# Patient Record
Sex: Male | Born: 1935 | Race: White | Hispanic: No | Marital: Married | Smoking: Former smoker
Health system: Southern US, Community
[De-identification: ages and names within clinical notes are randomized; demographics above are authoritative.]

## PROBLEM LIST (undated history)

## (undated) DIAGNOSIS — I714 Abdominal aortic aneurysm, without rupture, unspecified: Secondary | ICD-10-CM

## (undated) DIAGNOSIS — J189 Pneumonia, unspecified organism: Secondary | ICD-10-CM

## (undated) DIAGNOSIS — N183 Chronic kidney disease, stage 3 unspecified: Secondary | ICD-10-CM

## (undated) DIAGNOSIS — K5190 Ulcerative colitis, unspecified, without complications: Secondary | ICD-10-CM

## (undated) DIAGNOSIS — R030 Elevated blood-pressure reading, without diagnosis of hypertension: Secondary | ICD-10-CM

## (undated) DIAGNOSIS — N529 Male erectile dysfunction, unspecified: Secondary | ICD-10-CM

## (undated) DIAGNOSIS — K219 Gastro-esophageal reflux disease without esophagitis: Secondary | ICD-10-CM

## (undated) DIAGNOSIS — E785 Hyperlipidemia, unspecified: Secondary | ICD-10-CM

## (undated) DIAGNOSIS — Z87442 Personal history of urinary calculi: Secondary | ICD-10-CM

## (undated) HISTORY — DX: Gastro-esophageal reflux disease without esophagitis: K21.9

## (undated) HISTORY — DX: Abdominal aortic aneurysm, without rupture: I71.4

## (undated) HISTORY — DX: Hyperlipidemia, unspecified: E78.5

## (undated) HISTORY — PX: MANDIBLE FRACTURE SURGERY: SHX706

## (undated) HISTORY — DX: Ulcerative colitis, unspecified, without complications: K51.90

## (undated) HISTORY — DX: Chronic kidney disease, stage 3 (moderate): N18.3

## (undated) HISTORY — PX: BACK SURGERY: SHX140

## (undated) HISTORY — DX: Abdominal aortic aneurysm, without rupture, unspecified: I71.40

## (undated) HISTORY — DX: Male erectile dysfunction, unspecified: N52.9

## (undated) HISTORY — PX: TONSILLECTOMY: SUR1361

## (undated) HISTORY — DX: Pneumonia, unspecified organism: J18.9

## (undated) HISTORY — PX: FRACTURE SURGERY: SHX138

## (undated) HISTORY — DX: Chronic kidney disease, stage 3 unspecified: N18.30

## (undated) HISTORY — PX: KIDNEY STONE SURGERY: SHX686

## (undated) HISTORY — DX: Elevated blood-pressure reading, without diagnosis of hypertension: R03.0

---

## 1998-11-06 ENCOUNTER — Ambulatory Visit (HOSPITAL_COMMUNITY): Admission: RE | Admit: 1998-11-06 | Discharge: 1998-11-06 | Payer: Self-pay | Admitting: Gastroenterology

## 2000-01-16 ENCOUNTER — Encounter: Admission: RE | Admit: 2000-01-16 | Discharge: 2000-01-16 | Payer: Self-pay | Admitting: Family Medicine

## 2000-01-16 ENCOUNTER — Encounter: Payer: Self-pay | Admitting: Family Medicine

## 2000-01-17 ENCOUNTER — Encounter: Payer: Self-pay | Admitting: Family Medicine

## 2000-01-17 ENCOUNTER — Encounter: Admission: RE | Admit: 2000-01-17 | Discharge: 2000-01-17 | Payer: Self-pay | Admitting: Family Medicine

## 2000-02-17 ENCOUNTER — Emergency Department (HOSPITAL_COMMUNITY): Admission: EM | Admit: 2000-02-17 | Discharge: 2000-02-17 | Payer: Self-pay | Admitting: Emergency Medicine

## 2000-02-17 ENCOUNTER — Encounter: Payer: Self-pay | Admitting: Emergency Medicine

## 2000-03-11 HISTORY — PX: MAXIMUM ACCESS (MAS)POSTERIOR LUMBAR INTERBODY FUSION (PLIF) 1 LEVEL: SHX6368

## 2000-03-29 ENCOUNTER — Encounter: Payer: Self-pay | Admitting: Neurosurgery

## 2000-03-30 ENCOUNTER — Encounter: Payer: Self-pay | Admitting: Neurosurgery

## 2000-03-30 ENCOUNTER — Inpatient Hospital Stay (HOSPITAL_COMMUNITY): Admission: RE | Admit: 2000-03-30 | Discharge: 2000-03-31 | Payer: Self-pay | Admitting: Neurosurgery

## 2004-06-18 ENCOUNTER — Ambulatory Visit (HOSPITAL_COMMUNITY): Admission: RE | Admit: 2004-06-18 | Discharge: 2004-06-18 | Payer: Self-pay | Admitting: Gastroenterology

## 2005-09-23 ENCOUNTER — Encounter: Admission: RE | Admit: 2005-09-23 | Discharge: 2005-09-23 | Payer: Self-pay | Admitting: Family Medicine

## 2006-03-02 ENCOUNTER — Emergency Department (HOSPITAL_COMMUNITY): Admission: EM | Admit: 2006-03-02 | Discharge: 2006-03-02 | Payer: Self-pay | Admitting: Emergency Medicine

## 2007-09-19 ENCOUNTER — Encounter: Admission: RE | Admit: 2007-09-19 | Discharge: 2007-09-19 | Payer: Self-pay | Admitting: Family Medicine

## 2007-10-31 ENCOUNTER — Encounter: Admission: RE | Admit: 2007-10-31 | Discharge: 2007-10-31 | Payer: Self-pay | Admitting: Family Medicine

## 2010-09-26 NOTE — Op Note (Signed)
[place]  Patient:    Bryan Ford, Bryan Ford                       MRN: [id number] Proc. Date: 03/30/00 Adm. Date:  [id number] Attending:  Melton Krebs                           Operative Report  PREOPERATIVE DIAGNOSES:  Spondylosis and spinal stenosis L5-S1.  POSTOPERATIVE DIAGNOSES:  Spondylosis and spinal stenosis L5-S1.  OPERATION:  L5-S1 laminectomy, diskectomy, posterior lumbar body fusion with decompressive lumbar laminectomy at L5-S1.  SURGEON:  Elizabeth Sauer, M.D.  ANESTHESIA:  General endotracheal.  PREP:  Sterile Betadine prep and scrub with alcohol wipe.  COMPLICATIONS:  None.  INDICATIONS:  A 77 year old right-handed white gentleman with severe spinal stenosis at L5-S1.  DESCRIPTION OF PROCEDURE:  The patient was taken to the operating room and intubated. He was placed prone on the operating table. The patient was shaved and prepped and draped in the usual sterile fashion. The skin was infiltrated with 1% Lidocaine and 1:400,000 epinephrine. The skin was incised from the bottom of L4 to the bottom of S2 and the laminate of L5 and S1 were exposed bilaterally in the subperiosteal plane. Intraoperative x-ray showed the marker to be under the S1 lamina. Laminectomy was then carried out of L5 with under-cutting of S1 and under-cutting of L4. The entire L5 lamina was removed leaving the pars intra-articularis and the facet joints. At the L5-S1 interspace there was significant stenosis and overgrowth with abundant redundant ligamentum flavum causing significant both lateral recess and spinal stenosis. This was removed without difficulty and the laminectomy was completed out flush with the pedicles bilaterally. The 5N1 ridge were carefully explored as they traversed this area and were found to be free. The wound was irrigated and hemostasis assured. The fascia was reapproximated with 0 Vicryl in an interrupted fashion and subcutaneous  tissues were reapproximated with 0 Vicryl in an interrupted fashion. The subcuticular tissues were reapproximated with 3-0 Vicryl in an interrupted fashion. The skin was closed with 3-0 nylon in a running lock fashion. A Betadine towel dressing was applied and made occlusive with Op-Site. The patient returned to the recovery room in good condition. DD:  03/30/00 TD:  03/30/00 Job: [id number]

## 2010-09-26 NOTE — Op Note (Signed)
NAME:  Bryan Ford, Bryan Ford                ACCOUNT NO.:  [id number]   MEDICAL RECORD NO.:  [id number]          PATIENT TYPE:  AMB   LOCATION:  ENDO                         FACILITY:  [facility]   PHYSICIAN:  James L. Rolla Flatten., M.D.DATE OF BIRTH:  January 24, 1926   DATE OF PROCEDURE:  06/18/2004  DATE OF DISCHARGE:                                 OPERATIVE REPORT   PROCEDURE:  Colonoscopy.   MEDICATIONS:  Fentanyl 62.5 mcg, Versed 6 mg IV.   SCOPE:  Olympus pediatric adjustable colonoscope.   INDICATIONS FOR PROCEDURE:  The patient has had a previous history of  adenomatous colon polyps. This is done as a five year followup.   DESCRIPTION OF PROCEDURE:  The procedure explained to the patient and  consent obtained. With the patient in the left lateral decubitus position,  the Olympus scope was inserted and advanced.  The prep was excellent. We  were able to reach the cecum without difficulty. The ileocecal valve and  appendiceal orifice seen. The scope withdrawn and the cecum, ascending  colon, transverse colon, descending and sigmoid colon were seen well and no  further polyps seen. The rectum was free of polyps. There was no  diverticular disease. The scope was withdrawn. The patient tolerated the  procedure well.   ASSESSMENT:  Previous history of colon polyps with negative colonoscopy at  this time, V12.72.   PLAN:  Will recommend yearly hemoccult's and repeat colonoscopy in five  years.      JLE/MEDQ  D:  06/18/2004  T:  06/18/2004  Job:  [id number]   cc:   Luna Kitchens. Redmond Pulling, M.D.  [address]  [place]  [place] [address]  Fax: [phone]

## 2011-02-04 ENCOUNTER — Emergency Department (HOSPITAL_COMMUNITY): Payer: Medicare Other

## 2011-02-04 ENCOUNTER — Emergency Department (HOSPITAL_COMMUNITY)
Admission: EM | Admit: 2011-02-04 | Discharge: 2011-02-05 | Disposition: A | Discharge status: Home / Self Care | Payer: Medicare Other | Attending: Emergency Medicine | Admitting: Emergency Medicine

## 2011-02-04 DIAGNOSIS — K5289 Other specified noninfective gastroenteritis and colitis: Secondary | ICD-10-CM | POA: Insufficient documentation

## 2011-02-04 DIAGNOSIS — K921 Melena: Secondary | ICD-10-CM | POA: Insufficient documentation

## 2011-02-04 DIAGNOSIS — R509 Fever, unspecified: Secondary | ICD-10-CM | POA: Insufficient documentation

## 2011-02-04 DIAGNOSIS — I714 Abdominal aortic aneurysm, without rupture, unspecified: Secondary | ICD-10-CM | POA: Insufficient documentation

## 2011-02-04 DIAGNOSIS — R197 Diarrhea, unspecified: Secondary | ICD-10-CM | POA: Insufficient documentation

## 2011-02-04 DIAGNOSIS — R1031 Right lower quadrant pain: Secondary | ICD-10-CM | POA: Insufficient documentation

## 2011-02-04 LAB — DIFFERENTIAL
Basophils Absolute: 0 10*3/uL (ref 0.0–0.1)
Basophils Relative: 0 % (ref 0–1)
Eosinophils Relative: 0 % (ref 0–5)
Monocytes Absolute: 0.5 10*3/uL (ref 0.1–1.0)

## 2011-02-04 LAB — CBC
HCT: 30.7 % — ABNORMAL LOW (ref 39.0–52.0)
MCH: 29.4 pg (ref 26.0–34.0)
MCHC: 33.9 g/dL (ref 30.0–36.0)
RDW: 14.2 % (ref 11.5–15.5)

## 2011-02-04 LAB — BASIC METABOLIC PANEL
BUN: 20 mg/dL (ref 6–23)
Calcium: 9 mg/dL (ref 8.4–10.5)
GFR calc Af Amer: 47 mL/min — ABNORMAL LOW (ref >60)
GFR calc non Af Amer: 39 mL/min — ABNORMAL LOW (ref >60)
Potassium: 4 mEq/L (ref 3.5–5.1)
Sodium: 141 mEq/L (ref 135–145)

## 2011-02-04 LAB — URINALYSIS, ROUTINE W REFLEX MICROSCOPIC
Bilirubin Urine: NEGATIVE
Nitrite: NEGATIVE
Protein, ur: NEGATIVE mg/dL
Specific Gravity, Urine: 1.011 (ref 1.005–1.030)
Urobilinogen, UA: 0.2 mg/dL (ref 0.0–1.0)

## 2011-02-04 LAB — URINE MICROSCOPIC-ADD ON

## 2011-02-05 ENCOUNTER — Encounter (HOSPITAL_COMMUNITY): Payer: Self-pay | Admitting: Radiology

## 2011-06-08 DIAGNOSIS — D500 Iron deficiency anemia secondary to blood loss (chronic): Secondary | ICD-10-CM | POA: Diagnosis not present

## 2011-08-07 ENCOUNTER — Other Ambulatory Visit: Payer: Self-pay | Admitting: Internal Medicine

## 2011-08-07 DIAGNOSIS — I714 Abdominal aortic aneurysm, without rupture: Secondary | ICD-10-CM

## 2011-09-22 DIAGNOSIS — E785 Hyperlipidemia, unspecified: Secondary | ICD-10-CM | POA: Diagnosis not present

## 2011-09-22 DIAGNOSIS — N529 Male erectile dysfunction, unspecified: Secondary | ICD-10-CM | POA: Diagnosis not present

## 2011-09-22 DIAGNOSIS — Z125 Encounter for screening for malignant neoplasm of prostate: Secondary | ICD-10-CM | POA: Diagnosis not present

## 2011-09-29 ENCOUNTER — Other Ambulatory Visit: Payer: 59

## 2011-10-01 ENCOUNTER — Ambulatory Visit
Admission: RE | Admit: 2011-10-01 | Discharge: 2011-10-01 | Disposition: A | Discharge status: Home / Self Care | Payer: Medicare Other | Source: Ambulatory Visit | Attending: Internal Medicine | Admitting: Internal Medicine

## 2011-10-01 DIAGNOSIS — J309 Allergic rhinitis, unspecified: Secondary | ICD-10-CM | POA: Diagnosis not present

## 2011-10-01 DIAGNOSIS — E785 Hyperlipidemia, unspecified: Secondary | ICD-10-CM | POA: Diagnosis not present

## 2011-10-01 DIAGNOSIS — I714 Abdominal aortic aneurysm, without rupture: Secondary | ICD-10-CM

## 2011-10-01 DIAGNOSIS — Z23 Encounter for immunization: Secondary | ICD-10-CM | POA: Diagnosis not present

## 2011-11-30 DIAGNOSIS — J019 Acute sinusitis, unspecified: Secondary | ICD-10-CM | POA: Diagnosis not present

## 2011-11-30 DIAGNOSIS — J301 Allergic rhinitis due to pollen: Secondary | ICD-10-CM | POA: Diagnosis not present

## 2012-02-16 DIAGNOSIS — J331 Polypoid sinus degeneration: Secondary | ICD-10-CM | POA: Diagnosis not present

## 2012-02-16 DIAGNOSIS — Z23 Encounter for immunization: Secondary | ICD-10-CM | POA: Diagnosis not present

## 2012-02-16 DIAGNOSIS — H9210 Otorrhea, unspecified ear: Secondary | ICD-10-CM | POA: Diagnosis not present

## 2012-02-16 DIAGNOSIS — H6980 Other specified disorders of Eustachian tube, unspecified ear: Secondary | ICD-10-CM | POA: Diagnosis not present

## 2012-03-03 DIAGNOSIS — K5150 Left sided colitis without complications: Secondary | ICD-10-CM | POA: Diagnosis not present

## 2012-03-03 DIAGNOSIS — Z86010 Personal history of colonic polyps: Secondary | ICD-10-CM | POA: Diagnosis not present

## 2012-03-08 DIAGNOSIS — H9210 Otorrhea, unspecified ear: Secondary | ICD-10-CM | POA: Diagnosis not present

## 2012-03-08 DIAGNOSIS — H908 Mixed conductive and sensorineural hearing loss, unspecified: Secondary | ICD-10-CM | POA: Diagnosis not present

## 2012-03-08 DIAGNOSIS — J331 Polypoid sinus degeneration: Secondary | ICD-10-CM | POA: Diagnosis not present

## 2012-03-08 DIAGNOSIS — H6980 Other specified disorders of Eustachian tube, unspecified ear: Secondary | ICD-10-CM | POA: Diagnosis not present

## 2012-03-25 DIAGNOSIS — J331 Polypoid sinus degeneration: Secondary | ICD-10-CM | POA: Diagnosis not present

## 2012-03-25 DIAGNOSIS — H908 Mixed conductive and sensorineural hearing loss, unspecified: Secondary | ICD-10-CM | POA: Diagnosis not present

## 2012-03-25 DIAGNOSIS — H6980 Other specified disorders of Eustachian tube, unspecified ear: Secondary | ICD-10-CM | POA: Diagnosis not present

## 2012-03-25 DIAGNOSIS — H6520 Chronic serous otitis media, unspecified ear: Secondary | ICD-10-CM | POA: Diagnosis not present

## 2012-03-28 DIAGNOSIS — H6980 Other specified disorders of Eustachian tube, unspecified ear: Secondary | ICD-10-CM | POA: Diagnosis not present

## 2012-04-21 DIAGNOSIS — H9210 Otorrhea, unspecified ear: Secondary | ICD-10-CM | POA: Diagnosis not present

## 2012-04-25 DIAGNOSIS — H9210 Otorrhea, unspecified ear: Secondary | ICD-10-CM | POA: Diagnosis not present

## 2012-04-25 DIAGNOSIS — H6980 Other specified disorders of Eustachian tube, unspecified ear: Secondary | ICD-10-CM | POA: Diagnosis not present

## 2012-04-25 DIAGNOSIS — H908 Mixed conductive and sensorineural hearing loss, unspecified: Secondary | ICD-10-CM | POA: Diagnosis not present

## 2012-04-26 DIAGNOSIS — M25519 Pain in unspecified shoulder: Secondary | ICD-10-CM | POA: Diagnosis not present

## 2012-04-26 DIAGNOSIS — M19019 Primary osteoarthritis, unspecified shoulder: Secondary | ICD-10-CM | POA: Diagnosis not present

## 2012-05-02 DIAGNOSIS — M25519 Pain in unspecified shoulder: Secondary | ICD-10-CM | POA: Diagnosis not present

## 2012-05-06 DIAGNOSIS — M25519 Pain in unspecified shoulder: Secondary | ICD-10-CM | POA: Diagnosis not present

## 2012-05-09 DIAGNOSIS — M25519 Pain in unspecified shoulder: Secondary | ICD-10-CM | POA: Diagnosis not present

## 2012-05-11 DIAGNOSIS — M25519 Pain in unspecified shoulder: Secondary | ICD-10-CM | POA: Diagnosis not present

## 2012-05-12 DIAGNOSIS — M25519 Pain in unspecified shoulder: Secondary | ICD-10-CM | POA: Diagnosis not present

## 2012-05-16 DIAGNOSIS — M25519 Pain in unspecified shoulder: Secondary | ICD-10-CM | POA: Diagnosis not present

## 2012-05-17 DIAGNOSIS — M25519 Pain in unspecified shoulder: Secondary | ICD-10-CM | POA: Diagnosis not present

## 2012-05-23 DIAGNOSIS — M25519 Pain in unspecified shoulder: Secondary | ICD-10-CM | POA: Diagnosis not present

## 2012-05-26 DIAGNOSIS — M25519 Pain in unspecified shoulder: Secondary | ICD-10-CM | POA: Diagnosis not present

## 2012-05-30 DIAGNOSIS — M25519 Pain in unspecified shoulder: Secondary | ICD-10-CM | POA: Diagnosis not present

## 2012-06-02 DIAGNOSIS — M25519 Pain in unspecified shoulder: Secondary | ICD-10-CM | POA: Diagnosis not present

## 2012-06-06 DIAGNOSIS — M25519 Pain in unspecified shoulder: Secondary | ICD-10-CM | POA: Diagnosis not present

## 2012-06-10 DIAGNOSIS — M25519 Pain in unspecified shoulder: Secondary | ICD-10-CM | POA: Diagnosis not present

## 2012-08-04 DIAGNOSIS — H908 Mixed conductive and sensorineural hearing loss, unspecified: Secondary | ICD-10-CM | POA: Diagnosis not present

## 2012-08-04 DIAGNOSIS — H9190 Unspecified hearing loss, unspecified ear: Secondary | ICD-10-CM | POA: Diagnosis not present

## 2012-09-30 ENCOUNTER — Other Ambulatory Visit: Payer: Self-pay | Admitting: Internal Medicine

## 2012-09-30 DIAGNOSIS — I714 Abdominal aortic aneurysm, without rupture: Secondary | ICD-10-CM | POA: Diagnosis not present

## 2012-09-30 DIAGNOSIS — E0590 Thyrotoxicosis, unspecified without thyrotoxic crisis or storm: Secondary | ICD-10-CM | POA: Diagnosis not present

## 2012-09-30 DIAGNOSIS — E785 Hyperlipidemia, unspecified: Secondary | ICD-10-CM | POA: Diagnosis not present

## 2012-09-30 DIAGNOSIS — Z125 Encounter for screening for malignant neoplasm of prostate: Secondary | ICD-10-CM | POA: Diagnosis not present

## 2012-09-30 DIAGNOSIS — E663 Overweight: Secondary | ICD-10-CM | POA: Diagnosis not present

## 2012-09-30 DIAGNOSIS — E291 Testicular hypofunction: Secondary | ICD-10-CM | POA: Diagnosis not present

## 2012-09-30 DIAGNOSIS — Z0000 Encounter for general adult medical examination without abnormal findings: Secondary | ICD-10-CM | POA: Diagnosis not present

## 2012-10-06 ENCOUNTER — Ambulatory Visit
Admission: RE | Admit: 2012-10-06 | Discharge: 2012-10-06 | Disposition: A | Discharge status: Home / Self Care | Payer: Medicare Other | Source: Ambulatory Visit | Attending: Internal Medicine | Admitting: Internal Medicine

## 2012-10-06 DIAGNOSIS — I714 Abdominal aortic aneurysm, without rupture: Secondary | ICD-10-CM

## 2012-10-07 DIAGNOSIS — E785 Hyperlipidemia, unspecified: Secondary | ICD-10-CM | POA: Diagnosis not present

## 2012-10-07 DIAGNOSIS — I714 Abdominal aortic aneurysm, without rupture: Secondary | ICD-10-CM | POA: Diagnosis not present

## 2012-10-07 DIAGNOSIS — K219 Gastro-esophageal reflux disease without esophagitis: Secondary | ICD-10-CM | POA: Diagnosis not present

## 2012-11-01 ENCOUNTER — Encounter (HOSPITAL_COMMUNITY): Payer: Self-pay | Admitting: Cardiology

## 2012-11-01 ENCOUNTER — Encounter: Payer: Self-pay | Admitting: Cardiology

## 2012-11-01 ENCOUNTER — Ambulatory Visit (INDEPENDENT_AMBULATORY_CARE_PROVIDER_SITE_OTHER): Payer: 59 | Admitting: Cardiology

## 2012-11-01 VITALS — BP 150/80 | HR 49 | Ht 68.0 in | Wt 164.9 lb

## 2012-11-01 DIAGNOSIS — I714 Abdominal aortic aneurysm, without rupture, unspecified: Secondary | ICD-10-CM

## 2012-11-01 DIAGNOSIS — I739 Peripheral vascular disease, unspecified: Secondary | ICD-10-CM

## 2012-11-01 DIAGNOSIS — E785 Hyperlipidemia, unspecified: Secondary | ICD-10-CM | POA: Diagnosis not present

## 2012-11-01 DIAGNOSIS — I10 Essential (primary) hypertension: Secondary | ICD-10-CM | POA: Diagnosis not present

## 2012-11-01 MED ORDER — ATORVASTATIN CALCIUM 10 MG PO TABS: 10.0000 mg | ORAL_TABLET | Freq: Every day | ORAL | Status: DC

## 2012-11-01 NOTE — Patient Instructions (Addendum)
Your physician has recommended you make the following change in your medication: Lipitor 32m once daily.  Your physician wants you to follow-up with an extender in 3-4 weeks with Blood Pressure check.  Your physician has requested that you have an abdominal aorta duplex. During this test, an ultrasound is used to evaluate the aorta. Allow 30 minutes for this exam. Do not eat after midnight the day before and avoid carbonated beverages Please schedule abdominal aorta duplex for November 2014.   Follow up with Dr. HEllyn Hackafter abdominal ultrasound (Dec. 2014)

## 2012-11-07 ENCOUNTER — Encounter: Payer: Self-pay | Admitting: Cardiology

## 2012-11-07 DIAGNOSIS — E785 Hyperlipidemia, unspecified: Secondary | ICD-10-CM | POA: Insufficient documentation

## 2012-11-07 DIAGNOSIS — I10 Essential (primary) hypertension: Secondary | ICD-10-CM | POA: Insufficient documentation

## 2012-11-07 DIAGNOSIS — I714 Abdominal aortic aneurysm, without rupture, unspecified: Secondary | ICD-10-CM | POA: Insufficient documentation

## 2012-11-07 DIAGNOSIS — I739 Peripheral vascular disease, unspecified: Secondary | ICD-10-CM | POA: Insufficient documentation

## 2012-11-07 NOTE — Progress Notes (Signed)
Patient ID: Bryan Ford, male   DOB: [birth year], 79 y.o.   MRN: [id number]  Clinic Note: HPI: Bryan Ford is a 79 y.o. male with a PMH below who presents today for initial cardiology evaluation for AAA. Referred by Dr. Trilby Drummer, whom Bryan Ford saw on 10/07/2012.  Interval History: Bryan Ford is a very pleasant gentleman with dyslipidemia on statin who was a former cigar smoker quit years ago. Bryan Ford is very active does all housework and yard including the lawn, plays golf and walks daily. Bryan Ford denies any symptoms any chest pain shortness of breath with exertion no PND about the edema. Bryan Ford denies any syncope or near significant symptoms.. No claudication symptoms. A TIA or amaurosis fugax symptoms. No melena, hematochezia or hematuria.  Bryan Ford denies any abdominal pain or discomfort.  Past Medical History  Diagnosis Date  . Ulcerative colitis     Takes mesalamine  . Hyperlipidemia   . Chronic kidney disease, stage 3   . Esophageal reflux   . ED (erectile dysfunction)   . AAA (abdominal aortic aneurysm) without rupture     Abdominal ultrasound showed an increase from 4.1 to 5.3 cm diameter AAA.  Marland Kitchen Borderline hypertension     Prior Cardiac Evaluation and Past Surgical History: Past Surgical History  Procedure Laterality Date  . Kidney stone surgery  1970s  . Tonsillectomy    . Mandible fracture surgery  college   No Known Allergies  Current Outpatient Prescriptions  Medication Sig Dispense Refill  . mesalamine (LIALDA) 1.2 G EC tablet Take 2,400 mg by mouth daily with breakfast.      . atorvastatin (LIPITOR) 10 MG tablet Take 1 tablet (10 mg total) by mouth daily.  30 tablet  6   No current facility-administered medications for this visit.    atorvastatin is a new medication started today  History   Social History  . Marital Status: Married    Spouse Name: Or rub    Number of Children: 3  . Years of Education: N/A   Occupational History  . Retired At [employer]    Social History Main Topics  . Smoking status: Former Smoker    Types: Cigars    Quit date: 11/01/1973  . Smokeless tobacco: Never Used  . Alcohol Use: Yes     Comment: occasionally, but not every week  . Drug Use: No  . Sexually Active: Not on file   Other Topics Concern  . Not on file   Social History Narrative   7 range of motion. Very is worse on the ER all day, including mowing the lawn. Also plays golf and walks regularly.   History reviewed. No pertinent family history.  Bryan Ford is not aware of any significant history that is related to cardiac history Bryan Ford died of prostate cancer 55 and mother died at 76 with metastatic cancer of the brain.  ROS: A comprehensive Review of Systems - Negative except Mild osteoarthritis aches and pains. Otherwise normal. No active flares of Bryan ulcerative colitis.  PHYSICAL EXAM BP 150/80  Pulse 49  Ht 5' 8"  (1.727 m)  Wt 164 lb 14.4 oz (74.798 kg)  BMI 25.08 kg/m2 General appearance: alert, cooperative, appears stated age, no distress and Very pleasant mood and affect. Well-nourished and well-groomed. Answers questions appropriately Neck: no adenopathy, no carotid bruit, no JVD, supple, symmetrical, trachea midline and thyroid not enlarged, symmetric, no tenderness/mass/nodules Lungs: clear to auscultation bilaterally, normal percussion bilaterally and  No W./R./R. Nonlabored good air movement. Heart: regular rate and rhythm, S1, S2 normal, no murmur, click, rub or gallop and normal apical impulse Abdomen: soft, non-tender; bowel sounds normal; no masses,  no organomegaly and There is a palpable pulsatile vessel with deep palpation. It is nontender. Extremities: extremities normal, atraumatic, no cyanosis or edema, no edema, redness or tenderness in the calves or thighs and no ulcers, gangrene or trophic changes Pulses: 2+ and symmetric Neurologic: Alert and oriented X 3, normal strength and tone. Normal symmetric reflexes. Normal  coordination and gait  VOP:[id number] today: Yes Rate: 49 , Rhythm: Sinus bradycardia, otherwise normal ECG  Recent Labs: May 2013:Total Cholesterol 166, Triglycerides 181, HDL 40, LDL 90 from   May 2014: Total Cholesterol 166, Triglycerides 80, HDL 47, and LDL is up to 103.  ASSESSMENT: Relatively healthy gentleman from a cardiac standpoint with her recent studies shows an increased size of abdominal aortic aneurysm. Up from 4.1 to 5.3 cm in diameter. This is borderline for evaluation of per surgery. I think Bryan been referred to me for cardiac evaluation and consideration that atherosclerotic AAA would be a coronary artery:. Bryan Ford is very stable and ACE inhibitor for cardiac standpoint. I think the best plan here will be to followup in 6 months to ensure no continued rapid growth. we discussed signs and symptoms of aaa rupture. plan elevate to aggressively manage risk factors including hypertension and dyslipidemia.  Hypertension - Plan: EKG 12-Lead  PAD (peripheral artery disease) - Plan: EKG 12-Lead  AAA (abdominal aortic aneurysm) - Plan: Abdominal Aortic Aneurysm duplex  Dyslipidemia, goal LDL below 70 - Plan: atorvastatin (LIPITOR) 10 MG tablet  PLAN: Per problem list. Orders Placed This Encounter  Procedures  . EKG 12-Lead  . Abdominal Aortic Aneurysm duplex    Standing Status: Future     Number of Occurrences:      Standing Expiration Date: 04/09/2013    Order Specific Question:  Where should this test be performed:    Answer:  [facility]   Followup: 6 months  Hurshell Dino W, M.D., M.S. [facility] [address] [place], [place]  [address]  [phone] Pager # [phone] 11/07/2012 12:22 AM

## 2012-11-07 NOTE — Assessment & Plan Note (Signed)
I will concern about how his blood pressure is much higher here today. I think he is a little anxious and nervous. We would like to be monitoring this in and treat accordingly if the pressures are consistently elevated.

## 2012-11-07 NOTE — Assessment & Plan Note (Signed)
He will likely need intervention on this lesion, probably within the next year. At that we have followed another 6 months with another ultrasound. I will go ahead and discuss his case with vascular surgeons. He may be a good candidate for percutaneous  endovascular repair. I'll see him back echo Doppler done in 6 months, however if there is more concern from the vascular surgery standpoint going to him referred to them before that time for possible CT angiogram for staging and then anatomic location of the aneurysm.

## 2012-11-07 NOTE — Assessment & Plan Note (Addendum)
I am going to start him on a low-dose statin. With his history of a AAA now as a coronary risk: Our goal it allows less than 70. However with his age, we may be more lenient.

## 2012-11-08 ENCOUNTER — Telehealth: Payer: Self-pay | Admitting: Cardiology

## 2012-11-08 ENCOUNTER — Other Ambulatory Visit: Payer: Self-pay | Admitting: *Deleted

## 2012-11-08 ENCOUNTER — Telehealth: Payer: Self-pay | Admitting: Surgery

## 2012-11-08 DIAGNOSIS — I716 Thoracoabdominal aortic aneurysm, without rupture: Secondary | ICD-10-CM

## 2012-11-08 DIAGNOSIS — R935 Abnormal findings on diagnostic imaging of other abdominal regions, including retroperitoneum: Secondary | ICD-10-CM

## 2012-11-08 DIAGNOSIS — Z01810 Encounter for preprocedural cardiovascular examination: Secondary | ICD-10-CM

## 2012-11-08 NOTE — Telephone Encounter (Signed)
Returned call.  Left message to call back tomorrow before 4pm.

## 2012-11-08 NOTE — Telephone Encounter (Signed)
Please call-confused about why he has an appt with Dr Trula Slade! Never seen him-Dr harding had told him he would see him in November and decide what  He wanted to do !

## 2012-11-08 NOTE — Telephone Encounter (Addendum)
Message copied by Doristine Section on Tue Nov 08, 2012  1:19 PM ------      Message from: Serafina Mitchell      Created: Mon Nov 07, 2012  1:05 PM       Schedule him as new patient within 1-2 weeks.  He needs CTA chest abdomen and pelvis to evaluate AAA.  We will need to contact him.Marland Kitchen      ----- Message -----         From: Leonie Man, MD         Sent: 11/07/2012  12:43 AM           To: Serafina Mitchell, MD            Bryan Ford  -      This otherwise healthy gentleman was referred to me (? Not sure why) after his PCP did a follow up screening Abdominal Ultrasound that showed a growing AAA - 4.1 to 5.3 cm.        That is getting pretty close. I didn't have the [place] report, only the PCP report, but that seems like a pretty quick increase from May 2013 to May 2014.            Let me know what you think.             Leonie Man, MD             ------  notified patient of CTA appt. on 11-21-12 at [place] at 8:15 and then to see dr. Trula Slade at 12:00 mailed np info and cta instructions 11-08-12

## 2012-11-08 NOTE — Telephone Encounter (Signed)
Earlie Server, RN notified and will talk with pt when call returned.

## 2012-11-09 NOTE — Telephone Encounter (Signed)
Spoke with patient.Informed Mr Bryan Ford that Dr Ellyn Hack received more information concerning his AAA. Dr Ellyn Hack spoke with Dr Jeannette Corpus would like to see Mr Bryan Ford to do some more testing and see Mr Bryan Ford to discuss results. Mr Bryan Ford understood but was not happy with process ,he received a phone call from Dr Stephens Shire office before our office contacted him. I apologies for the communication and informed Mr Bryan Ford to contact Dr B. office if he has a problem with appointment time.

## 2012-11-09 NOTE — Telephone Encounter (Signed)
Left message to call back in regards to a phone call he received from Dr Baruch Goldmann office

## 2012-11-09 NOTE — Telephone Encounter (Signed)
Returning call from this morning!

## 2012-11-10 ENCOUNTER — Telehealth: Payer: Self-pay | Admitting: Cardiology

## 2012-11-10 NOTE — Telephone Encounter (Signed)
He wants Ivin Booty or somebody to call him today!

## 2012-11-10 NOTE — Telephone Encounter (Signed)
Yes

## 2012-11-10 NOTE — Telephone Encounter (Signed)
Returned call to [facility] and informed Dr. Ellyn Hack said August 4th is okay.  Also asked Tye Maryland if she calls pt to set up appt, to please inform pt that Ivin Booty will call him today before she leaves.

## 2012-11-10 NOTE — Telephone Encounter (Signed)
Spoke with Bryan Ford. He wanted to know what had change in his results that required him to see a surgeon right away.Informed him that Dr Ellyn Hack reviewed his studies as he was doing the dictation. Dr Ellyn Hack   discussed with Dr Trula Slade about them.They both made the discussion to have Bryan Ford see Dr Trula Slade to discuss options. Bryan Ford voiced understanding but states he was not pleased how he received the appointment with Dr Trula Slade. I apologies that our was unable to talk to him first it was miss communication.He verbalized understanding. Bryan Ford stated that he will keep his appointment for July with Dr Trula Slade.

## 2012-11-10 NOTE — Telephone Encounter (Signed)
Bryan Ford is wanting to know more information about what was found with his AAA and why are we doing something different. He wants more information in detail . I informed him that the nurse will call him back and will be glad to speak with him .Marland Kitchen   Thanks

## 2012-11-10 NOTE — Telephone Encounter (Signed)
Cathy from Dr. Stephens Shire office called and stated pt wants to move appt out so that they can be together and they only way they can do this is to schedule him on August 4th.  Wanted to know if that is okay or does he need to be seen more urgently.  Please call Cathy once response given.  Ivin Booty, RN notified and stated pt did call earlier requesting to talk w/ her and she told operator to let pt know she would call him back.  Pt upset that Dr. Stephens Shire office contacted him before our office.  Per Ivin Booty, she will call pt back.  Message forwarded to Dr. Ellyn Hack to review and advise if August 4th date is okay.

## 2012-11-11 NOTE — Telephone Encounter (Signed)
I think it would be fine to wait a little bit.  Dr. Trula Slade is very efficient & was just hoping to get him in while he is "fresh in his memory".  Leonie Man, MD

## 2012-11-14 ENCOUNTER — Other Ambulatory Visit: Payer: Self-pay | Admitting: Surgery

## 2012-11-14 DIAGNOSIS — I714 Abdominal aortic aneurysm, without rupture: Secondary | ICD-10-CM | POA: Diagnosis not present

## 2012-11-15 LAB — BUN: BUN: 24 mg/dL — ABNORMAL HIGH (ref 6–23)

## 2012-11-15 LAB — CREATININE, SERUM: Creat: 1.47 mg/dL — ABNORMAL HIGH (ref 0.50–1.35)

## 2012-11-17 ENCOUNTER — Telehealth: Payer: Self-pay | Admitting: *Deleted

## 2012-11-17 NOTE — Telephone Encounter (Signed)
Called and informed patient that the Nov 4, appt for ultrasound of abd aorta. Verbalized understanding.

## 2012-11-18 ENCOUNTER — Encounter: Payer: Self-pay | Admitting: Surgery

## 2012-11-21 ENCOUNTER — Other Ambulatory Visit (INDEPENDENT_AMBULATORY_CARE_PROVIDER_SITE_OTHER): Payer: 59

## 2012-11-21 ENCOUNTER — Encounter (INDEPENDENT_AMBULATORY_CARE_PROVIDER_SITE_OTHER): Payer: 59

## 2012-11-21 ENCOUNTER — Encounter: Payer: Medicare Other | Admitting: Surgery

## 2012-11-21 ENCOUNTER — Ambulatory Visit
Admission: RE | Admit: 2012-11-21 | Discharge: 2012-11-21 | Disposition: A | Discharge status: Home / Self Care | Payer: Medicare Other | Source: Ambulatory Visit | Attending: Surgery | Admitting: Surgery

## 2012-11-21 ENCOUNTER — Encounter: Payer: Self-pay | Admitting: Surgery

## 2012-11-21 ENCOUNTER — Ambulatory Visit (INDEPENDENT_AMBULATORY_CARE_PROVIDER_SITE_OTHER): Payer: 59 | Admitting: Surgery

## 2012-11-21 ENCOUNTER — Other Ambulatory Visit: Payer: Self-pay | Admitting: *Deleted

## 2012-11-21 VITALS — BP 157/67 | HR 42 | Temp 97.8°F | Ht 68.0 in | Wt 165.6 lb

## 2012-11-21 DIAGNOSIS — I714 Abdominal aortic aneurysm, without rupture, unspecified: Secondary | ICD-10-CM | POA: Insufficient documentation

## 2012-11-21 DIAGNOSIS — Z01810 Encounter for preprocedural cardiovascular examination: Secondary | ICD-10-CM

## 2012-11-21 DIAGNOSIS — I6529 Occlusion and stenosis of unspecified carotid artery: Secondary | ICD-10-CM

## 2012-11-21 DIAGNOSIS — R935 Abnormal findings on diagnostic imaging of other abdominal regions, including retroperitoneum: Secondary | ICD-10-CM

## 2012-11-21 DIAGNOSIS — R911 Solitary pulmonary nodule: Secondary | ICD-10-CM | POA: Diagnosis not present

## 2012-11-21 DIAGNOSIS — I716 Thoracoabdominal aortic aneurysm, without rupture: Secondary | ICD-10-CM

## 2012-11-21 DIAGNOSIS — I2510 Atherosclerotic heart disease of native coronary artery without angina pectoris: Secondary | ICD-10-CM | POA: Diagnosis not present

## 2012-11-21 DIAGNOSIS — K8020 Calculus of gallbladder without cholecystitis without obstruction: Secondary | ICD-10-CM | POA: Diagnosis not present

## 2012-11-21 MED ORDER — IOHEXOL 350 MG/ML SOLN
80.0000 mL | Freq: Once | INTRAVENOUS | Status: AC | PRN
  Administered 2012-11-21: 80 mL via INTRAVENOUS

## 2012-11-21 NOTE — Progress Notes (Signed)
[facility] [place]   Patient name: Bryan Ford MRN: [id number] DOB: Feb 06, 1926 Sex: male   Referred by: Dr. Ellyn Hack  Reason for referral:  Chief Complaint  Patient presents with  . New Evaluation    evaluate AAA - Dr. Glenetta Hew - CTA chest/abd/pelvis prior    HISTORY OF PRESENT ILLNESS: This is a very pleasant 79 year old gentleman who is referred to me for evaluation and management of an abdominal aortic aneurysm. The patient states that his aneurysm was found incidentally on a CT scan one year ago. Power ultrasound has revealed a significant increase in the size of his aneurysm from 4.1-5.3 over the past year. He remained asymptomatic. He denies abdominal pain or back pain.  The patient has been very healthy throughout her course was life. He does suffer from hypercholesterolemia which is treated with a statin. He has borderline hypertension and stage III chronic renal insufficiency. He has a history of smoking cigars but has not done so for 30 or 40 years. There is no family history of aneurysmal disease.  Past Medical History  Diagnosis Date  . Ulcerative colitis     Takes mesalamine  . Hyperlipidemia   . Chronic kidney disease, stage 3   . Esophageal reflux   . ED (erectile dysfunction)   . AAA (abdominal aortic aneurysm) without rupture     Abdominal ultrasound showed an increase from 4.1 to 5.3 cm diameter AAA.  Marland Kitchen Borderline hypertension     Past Surgical History  Procedure Laterality Date  . Kidney stone surgery  1970s  . Tonsillectomy    . Mandible fracture surgery  college    History   Social History  . Marital Status: Married    Spouse Name: Or rub    Number of Children: 3  . Years of Education: N/A   Occupational History  . Retired At [employer]   Social History Main Topics  . Smoking status: Former Smoker    Types: Cigars    Quit date: 11/01/1973  . Smokeless tobacco: Never Used  . Alcohol Use: 1 - 1.5  oz/week    2-3 drink(s) per week     Comment: occasionally, but not every week  . Drug Use: No  . Sexually Active: Not on file   Other Topics Concern  . Not on file   Social History Narrative   7 range of motion. Very is worse on the ER all day, including mowing the lawn. Also plays golf and walks regularly.    Family History  Problem Relation Age of Onset  . Cancer Mother     Allergies as of 11/21/2012  . (No Known Allergies)    Current Outpatient Prescriptions on File Prior to Visit  Medication Sig Dispense Refill  . atorvastatin (LIPITOR) 10 MG tablet Take 1 tablet (10 mg total) by mouth daily.  30 tablet  6  . mesalamine (LIALDA) 1.2 G EC tablet Take 2,400 mg by mouth daily with breakfast.       No current facility-administered medications on file prior to visit.     REVIEW OF SYSTEMS: Cardiovascular: No chest pain, chest pressure, palpitations, orthopnea, or dyspnea on exertion. No claudication or rest pain,  No history of DVT or phlebitis. Pulmonary: No productive cough, asthma or wheezing. Neurologic: No weakness, paresthesias, aphasia, or amaurosis. No dizziness. Hematologic: No bleeding problems or clotting disorders. Musculoskeletal: No joint pain or joint swelling. Gastrointestinal: No blood in stool or hematemesis  Genitourinary: No dysuria or hematuria. Psychiatric:: No history of major depression. Integumentary: No rashes or ulcers. Constitutional: No fever or chills.  PHYSICAL EXAMINATION: General: The patient appears their stated age.  Vital signs are BP 157/67  Pulse 42  Temp(Src) 97.8 F (36.6 C) (Oral)  Ht 5' 8"  (1.727 m)  Wt 165 lb 9.6 oz (75.116 kg)  BMI 25.19 kg/m2  SpO2 100% HEENT:  No gross abnormalities Pulmonary: Respirations are non-labored Abdomen: Soft and non-tender . Aneurysm is nontender Musculoskeletal: There are no major deformities.   Neurologic: No focal weakness or paresthesias are detected, Skin: There are no ulcer or  rashes noted. Psychiatric: The patient has normal affect. Cardiovascular: There is a regular rate and rhythm without significant murmur appreciated. No carotid bruits. Palpable pedal pulses bilaterally.  Diagnostic Studies: CT angiogram was ordered and reviewed today. This shows a 5 cm infrarenal abdominal aortic aneurysm with non-flow limiting dissection in the right common iliac. There is no evidence of rupture. He also has a small nodule in the chest which needs followup  Carotid duplex:Mild disease bilaterally   Lower extremity duplex:  No popliteal aneurysm bilaterally   Assessment:  Abdominal aortic aneurysm, infrarenal Plan: By my measurement, the maximum diameter of his abdominal aortic aneurysm is approximately 5.1 cm. I am concerned that he has had a significant increase in the size of his aneurysm based on his ultrasound a year ago. For that reason I have recommended that we proceed with repair. He is going on vacation this week and therefore I have scheduled him for Friday, August 8. We discussed the risks and benefits of surgery which include but are not limited to the risk of cardiopulmonary complications, stroke, death, bleeding, intestinal and lower extremity ischemia. We also discussed the possibility of a type II endoleak. All his questions were answered today.     Eldridge Abrahams, M.D. [facility] [place] Office: [phone] Pager:  [phone]

## 2012-11-24 ENCOUNTER — Encounter: Payer: Self-pay | Admitting: Physician Assistant

## 2012-11-24 ENCOUNTER — Ambulatory Visit (INDEPENDENT_AMBULATORY_CARE_PROVIDER_SITE_OTHER): Payer: 59 | Admitting: Physician Assistant

## 2012-11-24 VITALS — BP 150/80 | HR 52 | Ht 67.0 in | Wt 167.0 lb

## 2012-11-24 DIAGNOSIS — I10 Essential (primary) hypertension: Secondary | ICD-10-CM | POA: Diagnosis not present

## 2012-11-24 MED ORDER — HYDRALAZINE HCL 50 MG PO TABS: 50.0000 mg | ORAL_TABLET | Freq: Two times a day (BID) | ORAL | Status: DC

## 2012-11-24 NOTE — Patient Instructions (Signed)
Start taking hydralazine 50 mg twice a day. Stop Lipitor.  Follow up in two weeks with Tommy Medal for a BP check.

## 2012-11-24 NOTE — Assessment & Plan Note (Addendum)
Blood pressure is still elevated in the 150s as it was at prior office visit. We'll start hydralazine 50 mg twice daily. He'll follow up with Tommy Medal blood pressure check in 2 week.

## 2012-11-24 NOTE — Addendum Note (Signed)
Addended by: Dorthula Rue L on: 11/24/2012 11:45 AM   Modules accepted: Orders

## 2012-11-24 NOTE — Progress Notes (Addendum)
Date:  11/24/2012   ID:  Bryan Ford, DOB [birth year], MRN [id number]  PCP:  Thressa Sheller, MD  Primary Cardiologist:  Ellyn Hack      History of Present Illness: Bryan Ford is a 79 y.o. male with history of abdominal aortic aneurysm last measured at 5.3 cm, chronic kidney disease stage III, esophageal reflux, hypertension, hyperlipidemia, ulcerative colitis. Due to significant increase in size in the last year of his abdominal aortic aneurysm he is scheduled for surgery with Dr. Trula Slade on 12/16/2012.  He presents today for a blood pressure check.  She is currently not on any blood pressure medicine but is on Lipitor and is wondering why he was started on it.   The patient currently denies nausea, vomiting, fever, chest pain, shortness of breath, orthopnea, dizziness, PND, cough, congestion, abdominal pain, hematochezia, melena, lower extremity edema.  Wt Readings from Last 3 Encounters:  11/24/12 167 lb (75.751 kg)  11/21/12 165 lb 9.6 oz (75.116 kg)  11/01/12 164 lb 14.4 oz (74.798 kg)     Past Medical History  Diagnosis Date  . Ulcerative colitis     Takes mesalamine  . Hyperlipidemia   . Chronic kidney disease, stage 3   . Esophageal reflux   . ED (erectile dysfunction)   . AAA (abdominal aortic aneurysm) without rupture     Abdominal ultrasound showed an increase from 4.1 to 5.3 cm diameter AAA.  Marland Kitchen Borderline hypertension     Current Outpatient Prescriptions  Medication Sig Dispense Refill  . atorvastatin (LIPITOR) 10 MG tablet Take 1 tablet (10 mg total) by mouth daily.  30 tablet  6  . mesalamine (LIALDA) 1.2 G EC tablet Take 2,400 mg by mouth daily with breakfast.      . hydrALAZINE (APRESOLINE) 50 MG tablet Take 1 tablet (50 mg total) by mouth 2 (two) times daily.  60 tablet  5   No current facility-administered medications for this visit.    Allergies:   No Known Allergies  Social History:  The patient  reports that he quit smoking about 39 years ago. His  smoking use included Cigars. He has never used smokeless tobacco. He reports that he drinks about 1.0 ounces of alcohol per week. He reports that he does not use illicit drugs.   Family history:   Family History  Problem Relation Age of Onset  . Cancer Mother     ROS:  Please see the history of present illness.  All other systems reviewed and negative.   PHYSICAL EXAM: VS:  BP 150/80  Pulse 52  Ht 5' 7"  (1.702 m)  Wt 167 lb (75.751 kg)  BMI 26.15 kg/m2 Well nourished, well developed, in no acute distress HEENT: Pupils are equal round react to light accommodation extraocular movements are intact.  Cardiac: Regular rate and rhythm without murmurs rubs or gallops. Lungs:  clear to auscultation bilaterally, no wheezing, rhonchi or rales Abd: positive bowel sounds all quadrants, Ext: no lower extremity edema.  2+ radial and dorsalis pedis pulses. Skin: warm and dry Neuro:  Grossly normal      ASSESSMENT AND PLAN:  Problem List Items Addressed This Visit   Hypertension - Primary (Chronic)     Blood pressure is still elevated in the 150s as it was at prior office visit. We'll start hydralazine 50 mg twice daily. He'll follow up with Tommy Medal blood pressure check in 2 week.    Relevant Medications      hydrALAZINE (APRESOLINE) tablet  Patient was concerned and asking why he was placed on a statin at his last office visit.  [facility] and a copy of his labs sent over to show area calculated LDL of 100-3/4 total cholesterol is 166 with triglycerides of 80 and HDL of 47 VLDL of 16 triglyceride external ratio is less than 2.  Is probably safe to say that his LDL is made of larger particles which are less echogenic in nature. Given his age 86, is probably okay if he discontinues taking the statin.  Jaye Polidori 5:20 PM

## 2012-12-01 ENCOUNTER — Telehealth: Payer: Self-pay | Admitting: Pharmacist Clinician (PhC)/ Clinical Pharmacy Specialist

## 2012-12-01 NOTE — Telephone Encounter (Signed)
Called pt to r/s appointment left message

## 2012-12-02 ENCOUNTER — Other Ambulatory Visit: Payer: Self-pay

## 2012-12-08 ENCOUNTER — Ambulatory Visit (INDEPENDENT_AMBULATORY_CARE_PROVIDER_SITE_OTHER): Payer: Medicare Other | Admitting: Pharmacist Clinician (PhC)/ Clinical Pharmacy Specialist

## 2012-12-08 VITALS — BP 130/60

## 2012-12-08 DIAGNOSIS — I10 Essential (primary) hypertension: Secondary | ICD-10-CM

## 2012-12-08 NOTE — Patient Instructions (Signed)
Continue current medications  Follow up with Dr. Ellyn Hack

## 2012-12-08 NOTE — Progress Notes (Signed)
Pt seen for follow up BP check.  He has a history of AAA and is scheduled to have this surgically repaired on 8/8 by Dr. Trula Slade.  He was seen first by Dr. Ellyn Hack in June.  BP at that visit was 150/80.  Plan to recheck in a few week.  Martin Majestic, PA on 7/17 and BP was still slightly elevated at 833 systolic.  He was placed on hydralazine 69m BID at that time.   Pt here today with no complaints.  He has tolerated the hydralazine with no issues.  Does not report any dizziness or lightheadedness.  He does not check his BP on a regular basis at home.      BP today:   R arm: 130/60 L arm: 142/62  Current Outpatient Prescriptions  Medication Sig Dispense Refill  . hydrALAZINE (APRESOLINE) 50 MG tablet Take 1 tablet (50 mg total) by mouth 2 (two) times daily.  60 tablet  5  . mesalamine (LIALDA) 1.2 G EC tablet Take 2,400 mg by mouth daily with breakfast.       No current facility-administered medications for this visit.    No Known Allergies  Assessment and Plan Pt's BP better controlled on hydralazine.  No side effects or symptoms of hypotension noted.  Will continue current therapy.  Pt scheduled for procedure on 8/8.  Will have him follow up with Dr. HEllyn Hackas previously scheduled.

## 2012-12-09 ENCOUNTER — Encounter (HOSPITAL_COMMUNITY): Payer: Self-pay

## 2012-12-09 ENCOUNTER — Ambulatory Visit (HOSPITAL_COMMUNITY)
Admission: RE | Admit: 2012-12-09 | Discharge: 2012-12-09 | Disposition: A | Discharge status: Home / Self Care | Payer: Medicare Other | Source: Ambulatory Visit | Attending: Anesthesiology | Admitting: Anesthesiology

## 2012-12-09 ENCOUNTER — Encounter: Payer: Self-pay | Admitting: Cardiology

## 2012-12-09 ENCOUNTER — Encounter (HOSPITAL_COMMUNITY)
Admission: RE | Admit: 2012-12-09 | Discharge: 2012-12-09 | Disposition: A | Discharge status: Home / Self Care | Payer: Medicare Other | Source: Ambulatory Visit | Attending: Surgery | Admitting: Surgery

## 2012-12-09 DIAGNOSIS — I714 Abdominal aortic aneurysm, without rupture, unspecified: Secondary | ICD-10-CM | POA: Insufficient documentation

## 2012-12-09 DIAGNOSIS — Z01810 Encounter for preprocedural cardiovascular examination: Secondary | ICD-10-CM | POA: Insufficient documentation

## 2012-12-09 DIAGNOSIS — Z01812 Encounter for preprocedural laboratory examination: Secondary | ICD-10-CM | POA: Insufficient documentation

## 2012-12-09 DIAGNOSIS — I10 Essential (primary) hypertension: Secondary | ICD-10-CM | POA: Insufficient documentation

## 2012-12-09 DIAGNOSIS — Z01818 Encounter for other preprocedural examination: Secondary | ICD-10-CM | POA: Insufficient documentation

## 2012-12-09 LAB — CBC
HCT: 34.6 % — ABNORMAL LOW (ref 39.0–52.0)
Hemoglobin: 11.5 g/dL — ABNORMAL LOW (ref 13.0–17.0)
MCV: 89.9 fL (ref 78.0–100.0)
RDW: 14.3 % (ref 11.5–15.5)
WBC: 7.9 10*3/uL (ref 4.0–10.5)

## 2012-12-09 LAB — COMPREHENSIVE METABOLIC PANEL
Albumin: 3.5 g/dL (ref 3.5–5.2)
Alkaline Phosphatase: 69 U/L (ref 39–117)
BUN: 20 mg/dL (ref 6–23)
Chloride: 107 mEq/L (ref 96–112)
Creatinine, Ser: 1.44 mg/dL — ABNORMAL HIGH (ref 0.50–1.35)
GFR calc Af Amer: 50 mL/min — ABNORMAL LOW (ref >90)
GFR calc non Af Amer: 43 mL/min — ABNORMAL LOW (ref >90)
Glucose, Bld: 102 mg/dL — ABNORMAL HIGH (ref 70–99)
Total Bilirubin: 0.4 mg/dL (ref 0.3–1.2)

## 2012-12-09 LAB — URINALYSIS, ROUTINE W REFLEX MICROSCOPIC
Bilirubin Urine: NEGATIVE
Glucose, UA: NEGATIVE mg/dL
Hgb urine dipstick: NEGATIVE
Ketones, ur: NEGATIVE mg/dL
Specific Gravity, Urine: 1.018 (ref 1.005–1.030)
pH: 5.5 (ref 5.0–8.0)

## 2012-12-09 LAB — SURGICAL PCR SCREEN
MRSA, PCR: POSITIVE — AB
Staphylococcus aureus: POSITIVE — AB

## 2012-12-09 LAB — PROTIME-INR
INR: 1.02 (ref 0.00–1.49)
Prothrombin Time: 13.2 seconds (ref 11.6–15.2)

## 2012-12-09 LAB — URINE MICROSCOPIC-ADD ON

## 2012-12-09 LAB — ABO/RH: ABO/RH(D): A POS

## 2012-12-09 NOTE — Progress Notes (Signed)
Pt states he has seen Dr. Ellyn Hack from [place] of weeks ago.  DA

## 2012-12-09 NOTE — Progress Notes (Signed)
Reordered ABG d/t no results and being cancelled

## 2012-12-09 NOTE — Pre-Procedure Instructions (Signed)
SKYLUR FUSTON  12/09/2012   Your procedure is scheduled on: August 8th, Friday   Report to [facility] at 6:30 AM.             (Come through Entrance "A", sign in at desk)  Call this number if you have problems the morning of surgery: [phone]   Remember:   Do not eat food or drink liquids after midnight Thursday.   Take these medicines the morning of surgery with A SIP OF WATER:  none   Do not wear jewelry, no rings, watches.  Do not wear lotions, powders, or colognes. You may NOT wear deodorant.             Men may shave face and neck.   Do not bring valuables to the hospital.  [facility] is not responsible for any belongings or valuables.  Contacts, dentures or bridgework may not be worn into surgery.   Leave suitcase in the car. After surgery it may be brought to your room.  For patients admitted to the hospital, checkout time is 11:00 AM the day of discharge.   Name and phone number of your driver:    Special Instructions: Shower using CHG 2 nights before surgery and the night before surgery.  If you shower the day of surgery use CHG.  Use special wash - you have one bottle of CHG for all showers.  You should use approximately 1/3 of the bottle for each shower.   Please read over the following fact sheets that you were given: Pain Booklet, Coughing and Deep Breathing, Blood Transfusion Information, MRSA Information and Surgical Site Infection Prevention

## 2012-12-11 ENCOUNTER — Encounter (HOSPITAL_COMMUNITY): Payer: Self-pay | Admitting: *Deleted

## 2012-12-11 ENCOUNTER — Emergency Department (HOSPITAL_COMMUNITY): Payer: Medicare Other

## 2012-12-11 ENCOUNTER — Emergency Department (HOSPITAL_COMMUNITY)
Admission: EM | Admit: 2012-12-11 | Discharge: 2012-12-11 | Disposition: A | Discharge status: Home / Self Care | Payer: Medicare Other | Attending: Emergency Medicine | Admitting: Emergency Medicine

## 2012-12-11 DIAGNOSIS — N183 Chronic kidney disease, stage 3 unspecified: Secondary | ICD-10-CM | POA: Diagnosis not present

## 2012-12-11 DIAGNOSIS — Z8679 Personal history of other diseases of the circulatory system: Secondary | ICD-10-CM | POA: Diagnosis not present

## 2012-12-11 DIAGNOSIS — Z87891 Personal history of nicotine dependence: Secondary | ICD-10-CM | POA: Insufficient documentation

## 2012-12-11 DIAGNOSIS — Z79899 Other long term (current) drug therapy: Secondary | ICD-10-CM | POA: Insufficient documentation

## 2012-12-11 DIAGNOSIS — R109 Unspecified abdominal pain: Secondary | ICD-10-CM | POA: Diagnosis not present

## 2012-12-11 DIAGNOSIS — Z8719 Personal history of other diseases of the digestive system: Secondary | ICD-10-CM | POA: Diagnosis not present

## 2012-12-11 DIAGNOSIS — M545 Low back pain, unspecified: Secondary | ICD-10-CM | POA: Diagnosis not present

## 2012-12-11 DIAGNOSIS — Z862 Personal history of diseases of the blood and blood-forming organs and certain disorders involving the immune mechanism: Secondary | ICD-10-CM | POA: Diagnosis not present

## 2012-12-11 DIAGNOSIS — K5190 Ulcerative colitis, unspecified, without complications: Secondary | ICD-10-CM | POA: Insufficient documentation

## 2012-12-11 DIAGNOSIS — Z87448 Personal history of other diseases of urinary system: Secondary | ICD-10-CM | POA: Insufficient documentation

## 2012-12-11 DIAGNOSIS — Z8639 Personal history of other endocrine, nutritional and metabolic disease: Secondary | ICD-10-CM | POA: Insufficient documentation

## 2012-12-11 DIAGNOSIS — N1330 Unspecified hydronephrosis: Secondary | ICD-10-CM | POA: Diagnosis not present

## 2012-12-11 DIAGNOSIS — N201 Calculus of ureter: Secondary | ICD-10-CM | POA: Diagnosis not present

## 2012-12-11 DIAGNOSIS — Z9889 Other specified postprocedural states: Secondary | ICD-10-CM | POA: Insufficient documentation

## 2012-12-11 LAB — COMPREHENSIVE METABOLIC PANEL
ALT: 12 U/L (ref 0–53)
Albumin: 3.5 g/dL (ref 3.5–5.2)
Alkaline Phosphatase: 65 U/L (ref 39–117)
BUN: 21 mg/dL (ref 6–23)
Chloride: 108 mEq/L (ref 96–112)
Glucose, Bld: 109 mg/dL — ABNORMAL HIGH (ref 70–99)
Potassium: 4 mEq/L (ref 3.5–5.1)
Sodium: 141 mEq/L (ref 135–145)
Total Bilirubin: 0.4 mg/dL (ref 0.3–1.2)
Total Protein: 7.1 g/dL (ref 6.0–8.3)

## 2012-12-11 LAB — URINALYSIS, ROUTINE W REFLEX MICROSCOPIC
Bilirubin Urine: NEGATIVE
Glucose, UA: NEGATIVE mg/dL
Ketones, ur: NEGATIVE mg/dL
Protein, ur: NEGATIVE mg/dL
pH: 5.5 (ref 5.0–8.0)

## 2012-12-11 LAB — CBC WITH DIFFERENTIAL/PLATELET
Basophils Relative: 1 % (ref 0–1)
Eosinophils Absolute: 0.1 10*3/uL (ref 0.0–0.7)
Hemoglobin: 11.7 g/dL — ABNORMAL LOW (ref 13.0–17.0)
Lymphs Abs: 2 10*3/uL (ref 0.7–4.0)
MCH: 30.5 pg (ref 26.0–34.0)
Monocytes Relative: 7 % (ref 3–12)
Neutro Abs: 8.2 10*3/uL — ABNORMAL HIGH (ref 1.7–7.7)
Neutrophils Relative %: 74 % (ref 43–77)
Platelets: 287 10*3/uL (ref 150–400)
RBC: 3.84 MIL/uL — ABNORMAL LOW (ref 4.22–5.81)

## 2012-12-11 MED ORDER — OXYCODONE-ACETAMINOPHEN 5-325 MG PO TABS
1.0000 | ORAL_TABLET | Freq: Once | ORAL | Status: AC
  Administered 2012-12-11: 1 via ORAL
  Filled 2012-12-11: qty 1

## 2012-12-11 MED ORDER — ONDANSETRON HCL 4 MG PO TABS
8.0000 mg | ORAL_TABLET | Freq: Once | ORAL | Status: AC
  Administered 2012-12-11: 8 mg via ORAL
  Filled 2012-12-11: qty 2

## 2012-12-11 MED ORDER — OXYCODONE-ACETAMINOPHEN 5-325 MG PO TABS: 1.0000 | ORAL_TABLET | Freq: Four times a day (QID) | ORAL | Status: DC | PRN

## 2012-12-11 NOTE — ED Provider Notes (Signed)
CSN: [id number]     Arrival date & time 12/11/12  9509 History     First MD Initiated Contact with Patient 12/11/12 0435     Chief Complaint  Patient presents with  . Abdominal Pain   (Consider location/radiation/quality/duration/timing/severity/associated sxs/prior Treatment) Patient is a 79 y.o. male presenting with abdominal pain. The history is provided by the patient.  Abdominal Pain This is a new problem. Associated symptoms include abdominal pain. Pertinent negatives include no chest pain, no headaches and no shortness of breath.   patient with right-sided back/flank pain. It began rather acutely tonight. He has a known AAA and is scheduled for surgery on the eighth. No dysuria. No fevers. No nausea vomiting. No diarrhea.  Past Medical History  Diagnosis Date  . Ulcerative colitis     Takes mesalamine  . Hyperlipidemia   . Chronic kidney disease, stage 3   . Esophageal reflux   . ED (erectile dysfunction)   . AAA (abdominal aortic aneurysm) without rupture     Abdominal ultrasound showed an increase from 4.1 to 5.3 cm diameter AAA.  Marland Kitchen Borderline hypertension    Past Surgical History  Procedure Laterality Date  . Kidney stone surgery  1970s  . Tonsillectomy    . Mandible fracture surgery  college   Family History  Problem Relation Age of Onset  . Cancer Mother    History  Substance Use Topics  . Smoking status: Former Smoker    Types: Cigars    Quit date: 11/01/1973  . Smokeless tobacco: Never Used  . Alcohol Use: 1 - 1.5 oz/week    2-3 drink(s) per week     Comment: occasionally, but not every week    Review of Systems  Constitutional: Negative for activity change and appetite change.  HENT: Negative for neck stiffness.   Eyes: Negative for pain.  Respiratory: Negative for chest tightness and shortness of breath.   Cardiovascular: Negative for chest pain and leg swelling.  Gastrointestinal: Positive for abdominal pain. Negative for nausea, vomiting and  diarrhea.  Genitourinary: Positive for flank pain. Negative for dysuria.  Musculoskeletal: Positive for back pain.  Skin: Negative for rash.  Neurological: Negative for weakness, numbness and headaches.  Psychiatric/Behavioral: Negative for behavioral problems.    Allergies  Review of patient's allergies indicates no known allergies.  Home Medications   Current Outpatient Rx  Name  Route  Sig  Dispense  Refill  . hydrALAZINE (APRESOLINE) 50 MG tablet   Oral   Take 1 tablet (50 mg total) by mouth 2 (two) times daily.   60 tablet   5   . mesalamine (LIALDA) 1.2 G EC tablet   Oral   Take 2,400 mg by mouth daily with breakfast.         . oxyCODONE-acetaminophen (PERCOCET/ROXICET) 5-325 MG per tablet   Oral   Take 1-2 tablets by mouth every 6 (six) hours as needed for pain.   20 tablet   0    BP 156/65  Pulse 70  Temp(Src) 98 F (36.7 C)  Resp 18  SpO2 99% Physical Exam  Nursing note and vitals reviewed. Constitutional: He is oriented to person, place, and time. He appears well-developed and well-nourished.  HENT:  Head: Normocephalic and atraumatic.  Eyes: EOM are normal. Pupils are equal, round, and reactive to light.  Neck: Normal range of motion. Neck supple.  Cardiovascular: Normal rate, regular rhythm and normal heart sounds.   No murmur heard. Pulmonary/Chest: Effort normal and breath sounds normal.  Abdominal: Soft. Bowel sounds are normal. He exhibits no distension and no mass. There is no tenderness. There is no rebound and no guarding.  Musculoskeletal: Normal range of motion. He exhibits no edema.  Right lower back/flank tenderness on right. No rash. No clear CVA tenderness. No midline tenderness.  Neurological: He is alert and oriented to person, place, and time. No cranial nerve deficit.  Skin: Skin is warm and dry.  Psychiatric: He has a normal mood and affect.    ED Course   Procedures (including critical care time)  Labs Reviewed  CBC WITH  DIFFERENTIAL - Abnormal; Notable for the following:    WBC 11.1 (*)    RBC 3.84 (*)    Hemoglobin 11.7 (*)    HCT 34.6 (*)    Neutro Abs 8.2 (*)    All other components within normal limits  COMPREHENSIVE METABOLIC PANEL - Abnormal; Notable for the following:    Glucose, Bld 109 (*)    Creatinine, Ser 1.56 (*)    GFR calc non Af Amer 39 (*)    GFR calc Af Amer 45 (*)    All other components within normal limits  URINALYSIS, ROUTINE W REFLEX MICROSCOPIC   Ct Abdomen Pelvis Wo Contrast  12/11/2012   *RADIOLOGY REPORT*  Clinical Data: Back pain  CT ABDOMEN AND PELVIS WITHOUT CONTRAST  Technique:  Multidetector CT imaging of the abdomen and pelvis was performed following the standard protocol without intravenous contrast.  Comparison: Prior CT from 11/21/2012  Findings: T bibasilar airspace opacities with associated air bronchograms, left greater than right, are stable as compared to the prior examination.  The liver demonstrates a normal unenhanced appearance.  Calcified gallstones are present within the gallbladder lumen.  There is no evidence of acute cholecystitis.  No biliary dilatation.  The spleen is normal.  The adrenal glands are unremarkable.  Pancreas is within normal limits.  Punctate calcification within the pancreatic body is noted, unchanged.  On the left, multiple nonobstructive stones are seen within the left kidney, the largest of which measures 3 mm in the interpolar region (series 2, image 28).  There is no left-sided hydronephrosis.  No stones seen along the course of the left ureter.  On the right, to a obstructive stones measuring approximately 4 mm each are now seen within the mid right ureter (series 5, image 53). There has been interval development of moderate to right hydroureter and hydronephrosis proximally.  There is perinephric stranding about the right kidney.  No loculated perinephric fluid collections are identified.  These findings are new as compared to the prior study.   Hypodensity within the right kidney is again seen, likely small cyst.  Additional nonobstructive stones are seen within the right kidney measuring up to 4 mm.  No stones are seen within the bladder distally.  There is no evidence of bowel obstruction.  No abnormal in inflammatory changes are seen about the bowels.  The previously identified infrarenal abdominal aortic aneurysm and is grossly stable measuring 4.4 x 4.8 cm.  No evidence of aneurysm rupture.  Prominent calcified atheromatous disease is seen throughout the infrarenal aorta and its branch vessels.  There is no free air or fluid.  No pathologically enlarged intra- abdominal pelvic lymph nodes are seen.  The osseous structures are unchanged.  IMPRESSION:  1.  Two  obstructive 4 mm stones within the mid right ureter with secondary moderate right hydroureter and hydronephrosis proximally. Additional bilateral nephrolithiasis as above.  2.  Stable size and  morphology of infrarenal aortic aneurysm, measuring up to 4.8 cm in maximal diameter.  No evidence of aneurysm rupture  3.  Cholelithiasis without acute cholecystitis.  4.  Bibasilar parenchymal airspace opacities, similar to prior exam.   Original Report Authenticated By: Jeannine Boga, M.D.   Dg Chest 2 View  12/09/2012   *RADIOLOGY REPORT*  Clinical Data: Hypertension, pre operative for abdominal aortic aneurysm  CHEST - 2 VIEW  Comparison: Sep 23, 2005  Findings: There is no focal infiltrate pulmonary edema, or pleural effusion.  The aorta is tortuous.  The heart size is normal.  The soft tissues and osseous structures are stable.  IMPRESSION: No acute cardiopulmonary disease identified.   Original Report Authenticated By: Abelardo Diesel, M.D.   1. Right ureteral stone     MDM  Patient with right-sided ureteral stone. AAA size stable. No infection. Her creatinine is minimally elevated. Will follow with urology  Jasper Riling. Alvino Chapel, MD 12/11/12 [phone]

## 2012-12-11 NOTE — ED Notes (Signed)
Patient discharged to home with family. NAD.  

## 2012-12-11 NOTE — ED Notes (Addendum)
Pt c/o RLQ pain that radiates to the right lower back. Pt has surgery scheduled for Friday 8 th for aneurism. Hx of kidney stones

## 2012-12-12 ENCOUNTER — Encounter: Payer: Medicare Other | Admitting: Surgery

## 2012-12-12 ENCOUNTER — Telehealth: Payer: Self-pay | Admitting: *Deleted

## 2012-12-12 ENCOUNTER — Other Ambulatory Visit: Payer: Self-pay | Admitting: *Deleted

## 2012-12-12 DIAGNOSIS — H908 Mixed conductive and sensorineural hearing loss, unspecified: Secondary | ICD-10-CM | POA: Diagnosis not present

## 2012-12-12 DIAGNOSIS — A4902 Methicillin resistant Staphylococcus aureus infection, unspecified site: Secondary | ICD-10-CM

## 2012-12-12 DIAGNOSIS — H9210 Otorrhea, unspecified ear: Secondary | ICD-10-CM | POA: Diagnosis not present

## 2012-12-12 DIAGNOSIS — H6980 Other specified disorders of Eustachian tube, unspecified ear: Secondary | ICD-10-CM | POA: Diagnosis not present

## 2012-12-12 MED ORDER — MUPIROCIN 2 % EX OINT: TOPICAL_OINTMENT | CUTANEOUS | Status: DC

## 2012-12-12 NOTE — Telephone Encounter (Signed)
Faxed Rx for Bactroban 2% ointment to [facility] [address]. Positive nasal swab per Pre-anethesia testing. I called the patient and informed him to go to pharmacy and get this ointment; use according to Rx label starting today. He voiced understanding of this plan.

## 2012-12-12 NOTE — Progress Notes (Addendum)
Pt. Notified of positve PCR.  Dr. Trula Slade office notified to call prescription for mupircin  To pt. Pharmacy.

## 2012-12-15 MED ORDER — DEXTROSE 5 % IV SOLN
1.5000 g | INTRAVENOUS | Status: AC
  Administered 2012-12-16: 1.5 g via INTRAVENOUS
  Filled 2012-12-15: qty 1.5

## 2012-12-16 ENCOUNTER — Inpatient Hospital Stay (HOSPITAL_COMMUNITY): Payer: Medicare Other

## 2012-12-16 ENCOUNTER — Encounter (HOSPITAL_COMMUNITY): Payer: Self-pay | Admitting: Anesthesiology

## 2012-12-16 ENCOUNTER — Encounter (HOSPITAL_COMMUNITY)
Admission: RE | Disposition: A | Discharge status: Home / Self Care | Payer: Self-pay | Source: Ambulatory Visit | Attending: Surgery

## 2012-12-16 ENCOUNTER — Encounter (HOSPITAL_COMMUNITY): Payer: Self-pay | Admitting: Certified Registered Nurse Anesthetist

## 2012-12-16 ENCOUNTER — Inpatient Hospital Stay (HOSPITAL_COMMUNITY): Payer: Medicare Other | Admitting: Certified Registered Nurse Anesthetist

## 2012-12-16 ENCOUNTER — Inpatient Hospital Stay (HOSPITAL_COMMUNITY)
Admission: RE | Admit: 2012-12-16 | Discharge: 2012-12-17 | DRG: 238 | Disposition: A | Discharge status: Home / Self Care | Payer: Medicare Other | Source: Ambulatory Visit | Attending: Surgery | Admitting: Surgery

## 2012-12-16 ENCOUNTER — Other Ambulatory Visit: Payer: Self-pay | Admitting: *Deleted

## 2012-12-16 DIAGNOSIS — Z9889 Other specified postprocedural states: Secondary | ICD-10-CM

## 2012-12-16 DIAGNOSIS — I714 Abdominal aortic aneurysm, without rupture, unspecified: Secondary | ICD-10-CM | POA: Diagnosis not present

## 2012-12-16 DIAGNOSIS — Z79899 Other long term (current) drug therapy: Secondary | ICD-10-CM

## 2012-12-16 DIAGNOSIS — R0989 Other specified symptoms and signs involving the circulatory and respiratory systems: Secondary | ICD-10-CM | POA: Diagnosis not present

## 2012-12-16 DIAGNOSIS — Z87891 Personal history of nicotine dependence: Secondary | ICD-10-CM | POA: Diagnosis not present

## 2012-12-16 DIAGNOSIS — Z48812 Encounter for surgical aftercare following surgery on the circulatory system: Secondary | ICD-10-CM

## 2012-12-16 DIAGNOSIS — N183 Chronic kidney disease, stage 3 unspecified: Secondary | ICD-10-CM | POA: Diagnosis not present

## 2012-12-16 DIAGNOSIS — E785 Hyperlipidemia, unspecified: Secondary | ICD-10-CM | POA: Diagnosis present

## 2012-12-16 DIAGNOSIS — K5190 Ulcerative colitis, unspecified, without complications: Secondary | ICD-10-CM | POA: Diagnosis present

## 2012-12-16 DIAGNOSIS — K219 Gastro-esophageal reflux disease without esophagitis: Secondary | ICD-10-CM | POA: Diagnosis not present

## 2012-12-16 HISTORY — PX: ABDOMINAL AORTIC ENDOVASCULAR STENT GRAFT: SHX5707

## 2012-12-16 LAB — CBC
HCT: 31.5 % — ABNORMAL LOW (ref 39.0–52.0)
HCT: 31.2 % — ABNORMAL LOW (ref 39.0–52.0)
Hemoglobin: 10.7 g/dL — ABNORMAL LOW (ref 13.0–17.0)
MCH: 30.3 pg (ref 26.0–34.0)
MCHC: 34 g/dL (ref 30.0–36.0)
MCV: 89.2 fL (ref 78.0–100.0)
MCV: 89.4 fL (ref 78.0–100.0)
Platelets: 200 10*3/uL (ref 150–400)
Platelets: 214 10*3/uL (ref 150–400)
RBC: 3.53 MIL/uL — ABNORMAL LOW (ref 4.22–5.81)
RBC: 3.49 MIL/uL — ABNORMAL LOW (ref 4.22–5.81)
RDW: 14 % (ref 11.5–15.5)
WBC: 7 10*3/uL (ref 4.0–10.5)
WBC: 11 10*3/uL — ABNORMAL HIGH (ref 4.0–10.5)

## 2012-12-16 LAB — BASIC METABOLIC PANEL
CO2: 23 mEq/L (ref 19–32)
Chloride: 107 mEq/L (ref 96–112)
Creatinine, Ser: 1.44 mg/dL — ABNORMAL HIGH (ref 0.50–1.35)

## 2012-12-16 LAB — CREATININE, SERUM
GFR calc Af Amer: 52 mL/min — ABNORMAL LOW (ref >90)
GFR calc non Af Amer: 45 mL/min — ABNORMAL LOW (ref >90)

## 2012-12-16 LAB — APTT: aPTT: 29 seconds (ref 24–37)

## 2012-12-16 LAB — PROTIME-INR
INR: 1.19 (ref 0.00–1.49)
Prothrombin Time: 14.8 seconds (ref 11.6–15.2)

## 2012-12-16 LAB — MAGNESIUM: Magnesium: 1.6 mg/dL (ref 1.5–2.5)

## 2012-12-16 SURGERY — INSERTION, ENDOVASCULAR STENT GRAFT, AORTA, ABDOMINAL
Anesthesia: General | Site: Abdomen | Wound class: Clean

## 2012-12-16 MED ORDER — LABETALOL HCL 5 MG/ML IV SOLN: 10.0000 mg | INTRAVENOUS | Status: DC | PRN

## 2012-12-16 MED ORDER — SODIUM CHLORIDE 0.9 % IV SOLN: 500.0000 mL | Freq: Once | INTRAVENOUS | Status: AC | PRN

## 2012-12-16 MED ORDER — DOPAMINE-DEXTROSE 3.2-5 MG/ML-% IV SOLN: 3.0000 ug/kg/min | INTRAVENOUS | Status: DC

## 2012-12-16 MED ORDER — GLYCOPYRROLATE 0.2 MG/ML IJ SOLN
INTRAMUSCULAR | Status: DC | PRN
  Administered 2012-12-16: .8 mg via INTRAVENOUS
  Administered 2012-12-16: 0.4 mg via INTRAVENOUS

## 2012-12-16 MED ORDER — METOPROLOL TARTRATE 1 MG/ML IV SOLN
2.0000 mg | INTRAVENOUS | Status: DC | PRN
Start: 2012-12-16 — End: 2012-12-17

## 2012-12-16 MED ORDER — SODIUM CHLORIDE 0.9 % IV SOLN: INTRAVENOUS | Status: DC

## 2012-12-16 MED ORDER — MUPIROCIN 2 % EX OINT
TOPICAL_OINTMENT | Freq: Two times a day (BID) | CUTANEOUS | Status: DC
  Administered 2012-12-17: 10:00:00 via TOPICAL
  Filled 2012-12-16: qty 22

## 2012-12-16 MED ORDER — HEPARIN SODIUM (PORCINE) 1000 UNIT/ML IJ SOLN
INTRAMUSCULAR | Status: DC | PRN
  Administered 2012-12-16: 8000 [IU] via INTRAVENOUS

## 2012-12-16 MED ORDER — HYDRALAZINE HCL 50 MG PO TABS
50.0000 mg | ORAL_TABLET | Freq: Two times a day (BID) | ORAL | Status: DC
  Administered 2012-12-16 – 2012-12-17 (×2): 50 mg via ORAL
  Filled 2012-12-16 (×3): qty 1

## 2012-12-16 MED ORDER — MORPHINE SULFATE 2 MG/ML IJ SOLN: 2.0000 mg | INTRAMUSCULAR | Status: DC | PRN

## 2012-12-16 MED ORDER — LIDOCAINE HCL (CARDIAC) 20 MG/ML IV SOLN
INTRAVENOUS | Status: DC | PRN
  Administered 2012-12-16: 65 mg via INTRAVENOUS

## 2012-12-16 MED ORDER — 0.9 % SODIUM CHLORIDE (POUR BTL) OPTIME
TOPICAL | Status: DC | PRN
  Administered 2012-12-16: 1000 mL

## 2012-12-16 MED ORDER — DEXTROSE 5 % IV SOLN
1.5000 g | Freq: Two times a day (BID) | INTRAVENOUS | Status: AC
  Administered 2012-12-16 – 2012-12-17 (×2): 1.5 g via INTRAVENOUS
  Filled 2012-12-16 (×3): qty 1.5

## 2012-12-16 MED ORDER — PANTOPRAZOLE SODIUM 40 MG PO TBEC
40.0000 mg | DELAYED_RELEASE_TABLET | Freq: Every day | ORAL | Status: DC
  Administered 2012-12-17: 40 mg via ORAL
  Filled 2012-12-16: qty 1

## 2012-12-16 MED ORDER — DEXAMETHASONE SODIUM PHOSPHATE 4 MG/ML IJ SOLN
INTRAMUSCULAR | Status: DC | PRN
  Administered 2012-12-16: 8 mg via INTRAVENOUS

## 2012-12-16 MED ORDER — HYDRALAZINE HCL 20 MG/ML IJ SOLN: 10.0000 mg | INTRAMUSCULAR | Status: DC | PRN

## 2012-12-16 MED ORDER — PROTAMINE SULFATE 10 MG/ML IV SOLN
INTRAVENOUS | Status: DC | PRN
  Administered 2012-12-16: 50 mg via INTRAVENOUS

## 2012-12-16 MED ORDER — FENTANYL CITRATE 0.05 MG/ML IJ SOLN
INTRAMUSCULAR | Status: DC | PRN
  Administered 2012-12-16 (×4): 50 ug via INTRAVENOUS

## 2012-12-16 MED ORDER — ARTIFICIAL TEARS OP OINT
TOPICAL_OINTMENT | OPHTHALMIC | Status: DC | PRN
  Administered 2012-12-16: 1 via OPHTHALMIC

## 2012-12-16 MED ORDER — LACTATED RINGERS IV SOLN
INTRAVENOUS | Status: DC | PRN
  Administered 2012-12-16: 08:00:00 via INTRAVENOUS

## 2012-12-16 MED ORDER — MESALAMINE 1.2 G PO TBEC
2400.0000 mg | DELAYED_RELEASE_TABLET | Freq: Every day | ORAL | Status: DC
  Administered 2012-12-17: 2.4 g via ORAL
  Filled 2012-12-16 (×2): qty 2

## 2012-12-16 MED ORDER — PROPOFOL 10 MG/ML IV BOLUS
INTRAVENOUS | Status: DC | PRN
  Administered 2012-12-16: 120 mg via INTRAVENOUS

## 2012-12-16 MED ORDER — LIDOCAINE HCL 4 % MT SOLN
OROMUCOSAL | Status: DC | PRN
  Administered 2012-12-16: 4 mL via TOPICAL

## 2012-12-16 MED ORDER — SODIUM CHLORIDE 0.9 % IV SOLN
10.0000 mg | INTRAVENOUS | Status: DC | PRN
  Administered 2012-12-16: 20 ug/min via INTRAVENOUS

## 2012-12-16 MED ORDER — OXYCODONE-ACETAMINOPHEN 5-325 MG PO TABS: 1.0000 | ORAL_TABLET | Freq: Four times a day (QID) | ORAL | Status: DC | PRN

## 2012-12-16 MED ORDER — ALUM & MAG HYDROXIDE-SIMETH 200-200-20 MG/5ML PO SUSP: 15.0000 mL | ORAL | Status: DC | PRN

## 2012-12-16 MED ORDER — ACETAMINOPHEN 650 MG RE SUPP: 325.0000 mg | RECTAL | Status: DC | PRN

## 2012-12-16 MED ORDER — IODIXANOL 320 MG/ML IV SOLN
INTRAVENOUS | Status: DC | PRN
  Administered 2012-12-16: 150 mL via INTRA_ARTERIAL

## 2012-12-16 MED ORDER — SODIUM CHLORIDE 0.9 % IR SOLN
Status: DC | PRN
  Administered 2012-12-16: 08:00:00

## 2012-12-16 MED ORDER — PHENOL 1.4 % MT LIQD: 1.0000 | OROMUCOSAL | Status: DC | PRN

## 2012-12-16 MED ORDER — ROCURONIUM BROMIDE 100 MG/10ML IV SOLN
INTRAVENOUS | Status: DC | PRN
  Administered 2012-12-16: 10 mg via INTRAVENOUS
  Administered 2012-12-16: 50 mg via INTRAVENOUS

## 2012-12-16 MED ORDER — ACETAMINOPHEN 325 MG PO TABS: 325.0000 mg | ORAL_TABLET | ORAL | Status: DC | PRN

## 2012-12-16 MED ORDER — EPHEDRINE SULFATE 50 MG/ML IJ SOLN
INTRAMUSCULAR | Status: DC | PRN
  Administered 2012-12-16: 2.5 mg via INTRAVENOUS

## 2012-12-16 MED ORDER — MIDAZOLAM HCL 5 MG/5ML IJ SOLN
INTRAMUSCULAR | Status: DC | PRN
  Administered 2012-12-16: 2 mg via INTRAVENOUS

## 2012-12-16 MED ORDER — GUAIFENESIN-DM 100-10 MG/5ML PO SYRP: 15.0000 mL | ORAL_SOLUTION | ORAL | Status: DC | PRN

## 2012-12-16 MED ORDER — DOCUSATE SODIUM 100 MG PO CAPS
100.0000 mg | ORAL_CAPSULE | Freq: Every day | ORAL | Status: DC
  Administered 2012-12-17: 100 mg via ORAL
  Filled 2012-12-16: qty 1

## 2012-12-16 MED ORDER — ONDANSETRON HCL 4 MG/2ML IJ SOLN: 4.0000 mg | Freq: Four times a day (QID) | INTRAMUSCULAR | Status: DC | PRN

## 2012-12-16 MED ORDER — SENNOSIDES-DOCUSATE SODIUM 8.6-50 MG PO TABS
1.0000 | ORAL_TABLET | Freq: Every evening | ORAL | Status: DC | PRN
  Filled 2012-12-16: qty 1

## 2012-12-16 MED ORDER — ONDANSETRON HCL 4 MG/2ML IJ SOLN
INTRAMUSCULAR | Status: DC | PRN
  Administered 2012-12-16: 4 mg via INTRAVENOUS

## 2012-12-16 MED ORDER — ONDANSETRON HCL 4 MG/2ML IJ SOLN: 4.0000 mg | Freq: Once | INTRAMUSCULAR | Status: DC | PRN

## 2012-12-16 MED ORDER — MAGNESIUM SULFATE 40 MG/ML IJ SOLN
2.0000 g | Freq: Once | INTRAMUSCULAR | Status: AC | PRN
  Filled 2012-12-16: qty 50

## 2012-12-16 MED ORDER — NEOSTIGMINE METHYLSULFATE 1 MG/ML IJ SOLN
INTRAMUSCULAR | Status: DC | PRN
  Administered 2012-12-16: 5 mg via INTRAVENOUS

## 2012-12-16 MED ORDER — BISACODYL 10 MG RE SUPP: 10.0000 mg | Freq: Every day | RECTAL | Status: DC | PRN

## 2012-12-16 MED ORDER — ENOXAPARIN SODIUM 30 MG/0.3ML ~~LOC~~ SOLN
30.0000 mg | SUBCUTANEOUS | Status: DC
  Administered 2012-12-17: 30 mg via SUBCUTANEOUS
  Filled 2012-12-16 (×2): qty 0.3

## 2012-12-16 MED ORDER — POTASSIUM CHLORIDE CRYS ER 20 MEQ PO TBCR: 20.0000 meq | EXTENDED_RELEASE_TABLET | Freq: Once | ORAL | Status: AC | PRN

## 2012-12-16 MED ORDER — HYDROMORPHONE HCL PF 1 MG/ML IJ SOLN: 0.2500 mg | INTRAMUSCULAR | Status: DC | PRN

## 2012-12-16 SURGICAL SUPPLY — 78 items
ADH SKN CLS APL DERMABOND .7 (GAUZE/BANDAGES/DRESSINGS) ×1
BAG BANDED W/RUBBER/TAPE 36X54 (MISCELLANEOUS) ×6 IMPLANT
BAG EQP BAND 135X91 W/RBR TAPE (MISCELLANEOUS) ×4
BAG SNAP BAND KOVER 36X36 (MISCELLANEOUS) ×4 IMPLANT
CANISTER SUCTION 2500CC (MISCELLANEOUS) ×2 IMPLANT
CATH BEACON 5.038 65CM KMP-01 (CATHETERS) ×1 IMPLANT
CATH OMNI FLUSH .035X70CM (CATHETERS) ×1 IMPLANT
CLIP TI MEDIUM 24 (CLIP) IMPLANT
CLIP TI WIDE RED SMALL 24 (CLIP) IMPLANT
CLOTH BEACON ORANGE TIMEOUT ST (SAFETY) ×2 IMPLANT
COVER DOME SNAP 22 D (MISCELLANEOUS) ×2 IMPLANT
COVER MAYO STAND STRL (DRAPES) ×2 IMPLANT
COVER PROBE W GEL 5X96 (DRAPES) ×2 IMPLANT
COVER SURGICAL LIGHT HANDLE (MISCELLANEOUS) ×2 IMPLANT
DERMABOND ADVANCED (GAUZE/BANDAGES/DRESSINGS) ×1
DERMABOND ADVANCED .7 DNX12 (GAUZE/BANDAGES/DRESSINGS) ×1 IMPLANT
DEVICE CLOSURE PERCLS PRGLD 6F (VASCULAR PRODUCTS) IMPLANT
DRAPE TABLE COVER HEAVY DUTY (DRAPES) ×2 IMPLANT
DRESSING OPSITE X SMALL 2X3 (GAUZE/BANDAGES/DRESSINGS) ×4 IMPLANT
DRYSEAL FLEXSHEATH 12FR 33CM (SHEATH) ×1
DRYSEAL FLEXSHEATH 18FR 33CM (SHEATH) ×1
ELECT REM PT RETURN 9FT ADLT (ELECTROSURGICAL) ×4
ELECTRODE REM PT RTRN 9FT ADLT (ELECTROSURGICAL) ×2 IMPLANT
GAUZE SPONGE 2X2 8PLY STRL LF (GAUZE/BANDAGES/DRESSINGS) ×1 IMPLANT
GLOVE BIOGEL PI IND STRL 6.5 (GLOVE) IMPLANT
GLOVE BIOGEL PI IND STRL 7.5 (GLOVE) ×2 IMPLANT
GLOVE BIOGEL PI INDICATOR 6.5 (GLOVE) ×2
GLOVE BIOGEL PI INDICATOR 7.5 (GLOVE) ×2
GLOVE ECLIPSE 7.5 STRL STRAW (GLOVE) ×4 IMPLANT
GLOVE SS BIOGEL STRL SZ 7.5 (GLOVE) IMPLANT
GLOVE SUPERSENSE BIOGEL SZ 7.5 (GLOVE) ×1
GLOVE SURG SS PI 7.5 STRL IVOR (GLOVE) ×2 IMPLANT
GOWN PREVENTION PLUS XXLARGE (GOWN DISPOSABLE) ×4 IMPLANT
GOWN STRL NON-REIN LRG LVL3 (GOWN DISPOSABLE) ×2 IMPLANT
GRAFT BALLN CATH 65CM (STENTS) IMPLANT
GRAFT ENDOPROSETHESIS 26/14/12 (Endovascular Graft) ×1 IMPLANT
HEMOSTAT SNOW SURGICEL 2X4 (HEMOSTASIS) IMPLANT
KIT BASIN OR (CUSTOM PROCEDURE TRAY) ×2 IMPLANT
KIT ROOM TURNOVER OR (KITS) ×2 IMPLANT
LEG CONTRALATERAL 16X20X9.5 (Endovascular Graft) ×2 IMPLANT
LEG CONTRALETERAL16X16X11.5 (Endovascular Graft) ×2 IMPLANT
NDL PERC 18GX7CM (NEEDLE) ×1 IMPLANT
NEEDLE PERC 18GX7CM (NEEDLE) ×2 IMPLANT
NS IRRIG 1000ML POUR BTL (IV SOLUTION) ×2 IMPLANT
PACK AORTA (CUSTOM PROCEDURE TRAY) ×2 IMPLANT
PAD ARMBOARD 7.5X6 YLW CONV (MISCELLANEOUS) ×4 IMPLANT
PENCIL BUTTON HOLSTER BLD 10FT (ELECTRODE) IMPLANT
PERCLOSE PROGLIDE 6F (VASCULAR PRODUCTS) ×8
PROTECTION STATION PRESSURIZED (MISCELLANEOUS) ×2
SHEATH AVANTI 11CM 8FR (MISCELLANEOUS) ×2 IMPLANT
SHEATH BRITE TIP 8FR 23CM (MISCELLANEOUS) ×2 IMPLANT
SHEATH DRYSEAL FLEX 12FR 33CM (SHEATH) IMPLANT
SHEATH DRYSEAL FLEX 18FR 33CM (SHEATH) IMPLANT
SPONGE GAUZE 2X2 STER 10/PKG (GAUZE/BANDAGES/DRESSINGS) ×1
STAPLER VISISTAT 35W (STAPLE) IMPLANT
STATION PROTECTION PRESSURIZED (MISCELLANEOUS) IMPLANT
STENT GRAFT BALLN CATH 65CM (STENTS) ×1
STENT GRAFT CONTRALAT 16X11.5 (Endovascular Graft) IMPLANT
STENT GRAFT CONTRALAT 16X20X9. (Endovascular Graft) IMPLANT
STOPCOCK MORSE 400PSI 3WAY (MISCELLANEOUS) ×2 IMPLANT
SUT ETHILON 3 0 PS 1 (SUTURE) IMPLANT
SUT PROLENE 5 0 C 1 24 (SUTURE) IMPLANT
SUT VIC AB 2-0 CT1 27 (SUTURE)
SUT VIC AB 2-0 CT1 TAPERPNT 27 (SUTURE) IMPLANT
SUT VIC AB 3-0 SH 27 (SUTURE)
SUT VIC AB 3-0 SH 27X BRD (SUTURE) IMPLANT
SUT VICRYL 4-0 PS2 18IN ABS (SUTURE) ×4 IMPLANT
SYR 20CC LL (SYRINGE) ×4 IMPLANT
SYR 30ML LL (SYRINGE) IMPLANT
SYR 5ML LL (SYRINGE) IMPLANT
SYR MEDRAD MARK V 150ML (SYRINGE) ×2 IMPLANT
SYRINGE 10CC LL (SYRINGE) ×6 IMPLANT
TOWEL OR 17X24 6PK STRL BLUE (TOWEL DISPOSABLE) ×4 IMPLANT
TOWEL OR 17X26 10 PK STRL BLUE (TOWEL DISPOSABLE) ×4 IMPLANT
TRAY FOLEY CATH 16FRSI W/METER (SET/KITS/TRAYS/PACK) ×2 IMPLANT
TUBING HIGH PRESSURE 120CM (CONNECTOR) ×2 IMPLANT
WIRE AMPLATZ SS-J .035X180CM (WIRE) ×4 IMPLANT
WIRE BENTSON .035X145CM (WIRE) ×4 IMPLANT

## 2012-12-16 NOTE — Transfer of Care (Signed)
Immediate Anesthesia Transfer of Care Note  Patient: Bryan Ford  Procedure(s) Performed: Procedure(s) with comments: ABDOMINAL AORTIC ENDOVASCULAR STENT GRAFT-GORE (N/A) - Ultrasound guided  Patient Location: PACU  Anesthesia Type:General  Level of Consciousness: awake, alert , oriented and patient cooperative  Airway & Oxygen Therapy: Patient Spontanous Breathing and Patient connected to nasal cannula oxygen  Post-op Assessment: Report given to PACU RN, Post -op Vital signs reviewed and stable and Patient moving all extremities X 4  Post vital signs: Reviewed and stable  Complications: No apparent anesthesia complications

## 2012-12-16 NOTE — Preoperative (Signed)
Beta Blockers   Reason not to administer Beta Blockers:Not Applicable 

## 2012-12-16 NOTE — Anesthesia Preprocedure Evaluation (Signed)
Anesthesia Evaluation    Airway       Dental   Pulmonary former smoker,          Cardiovascular hypertension, + Peripheral Vascular Disease     Neuro/Psych    GI/Hepatic PUD, GERD-  ,  Endo/Other    Renal/GU CRFRenal disease     Musculoskeletal   Abdominal   Peds  Hematology   Anesthesia Other Findings AAA  Reproductive/Obstetrics                           Anesthesia Physical Anesthesia Plan  ASA: III  Anesthesia Plan: General   Post-op Pain Management:    Induction: Intravenous  Airway Management Planned: Oral ETT  Additional Equipment: Arterial line and CVP  Intra-op Plan:   Post-operative Plan: Extubation in OR  Informed Consent: I have reviewed the patients History and Physical, chart, labs and discussed the procedure including the risks, benefits and alternatives for the proposed anesthesia with the patient or authorized representative who has indicated his/her understanding and acceptance.     Plan Discussed with: CRNA, Anesthesiologist and Surgeon  Anesthesia Plan Comments:         Anesthesia Quick Evaluation

## 2012-12-16 NOTE — Interval H&P Note (Signed)
History and Physical Interval Note:  12/16/2012 7:16 AM  Bryan Ford  has presented today for surgery, with the diagnosis of Abdominal Aortic Aneurysm  The various methods of treatment have been discussed with the patient and family. After consideration of risks, benefits and other options for treatment, the patient has consented to  Procedure(s): ABDOMINAL AORTIC ENDOVASCULAR STENT GRAFT-GORE (N/A) as a surgical intervention .  The patient's history has been reviewed, patient examined, no change in status, stable for surgery.  I have reviewed the patient's chart and labs.  Questions were answered to the patient's satisfaction.     Kiet Geer IV, V. WELLS

## 2012-12-16 NOTE — H&P (View-Only) (Signed)
[facility] [place]   Patient name: Bryan Ford MRN: [id number] DOB: 09/10/1925 Sex: male   Referred by: Dr. Ellyn Hack  Reason for referral:  Chief Complaint  Patient presents with  . New Evaluation    evaluate AAA - Dr. Glenetta Hew - CTA chest/abd/pelvis prior    HISTORY OF PRESENT ILLNESS: This is a very pleasant 79 year old gentleman who is referred to me for evaluation and management of an abdominal aortic aneurysm. The patient states that his aneurysm was found incidentally on a CT scan one year ago. Power ultrasound has revealed a significant increase in the size of his aneurysm from 4.1-5.3 over the past year. He remained asymptomatic. He denies abdominal pain or back pain.  The patient has been very healthy throughout her course was life. He does suffer from hypercholesterolemia which is treated with a statin. He has borderline hypertension and stage III chronic renal insufficiency. He has a history of smoking cigars but has not done so for 30 or 40 years. There is no family history of aneurysmal disease.  Past Medical History  Diagnosis Date  . Ulcerative colitis     Takes mesalamine  . Hyperlipidemia   . Chronic kidney disease, stage 3   . Esophageal reflux   . ED (erectile dysfunction)   . AAA (abdominal aortic aneurysm) without rupture     Abdominal ultrasound showed an increase from 4.1 to 5.3 cm diameter AAA.  Marland Kitchen Borderline hypertension     Past Surgical History  Procedure Laterality Date  . Kidney stone surgery  1970s  . Tonsillectomy    . Mandible fracture surgery  college    History   Social History  . Marital Status: Married    Spouse Name: Or rub    Number of Children: 3  . Years of Education: N/A   Occupational History  . Retired At [employer]   Social History Main Topics  . Smoking status: Former Smoker    Types: Cigars    Quit date: 11/01/1973  . Smokeless tobacco: Never Used  . Alcohol Use: 1 - 1.5  oz/week    2-3 drink(s) per week     Comment: occasionally, but not every week  . Drug Use: No  . Sexually Active: Not on file   Other Topics Concern  . Not on file   Social History Narrative   7 range of motion. Very is worse on the ER all day, including mowing the lawn. Also plays golf and walks regularly.    Family History  Problem Relation Age of Onset  . Cancer Mother     Allergies as of 11/21/2012  . (No Known Allergies)    Current Outpatient Prescriptions on File Prior to Visit  Medication Sig Dispense Refill  . atorvastatin (LIPITOR) 10 MG tablet Take 1 tablet (10 mg total) by mouth daily.  30 tablet  6  . mesalamine (LIALDA) 1.2 G EC tablet Take 2,400 mg by mouth daily with breakfast.       No current facility-administered medications on file prior to visit.     REVIEW OF SYSTEMS: Cardiovascular: No chest pain, chest pressure, palpitations, orthopnea, or dyspnea on exertion. No claudication or rest pain,  No history of DVT or phlebitis. Pulmonary: No productive cough, asthma or wheezing. Neurologic: No weakness, paresthesias, aphasia, or amaurosis. No dizziness. Hematologic: No bleeding problems or clotting disorders. Musculoskeletal: No joint pain or joint swelling. Gastrointestinal: No blood in stool or hematemesis  Genitourinary: No dysuria or hematuria. Psychiatric:: No history of major depression. Integumentary: No rashes or ulcers. Constitutional: No fever or chills.  PHYSICAL EXAMINATION: General: The patient appears their stated age.  Vital signs are BP 157/67  Pulse 42  Temp(Src) 97.8 F (36.6 C) (Oral)  Ht 5' 8"  (1.727 m)  Wt 165 lb 9.6 oz (75.116 kg)  BMI 25.19 kg/m2  SpO2 100% HEENT:  No gross abnormalities Pulmonary: Respirations are non-labored Abdomen: Soft and non-tender . Aneurysm is nontender Musculoskeletal: There are no major deformities.   Neurologic: No focal weakness or paresthesias are detected, Skin: There are no ulcer or  rashes noted. Psychiatric: The patient has normal affect. Cardiovascular: There is a regular rate and rhythm without significant murmur appreciated. No carotid bruits. Palpable pedal pulses bilaterally.  Diagnostic Studies: CT angiogram was ordered and reviewed today. This shows a 5 cm infrarenal abdominal aortic aneurysm with non-flow limiting dissection in the right common iliac. There is no evidence of rupture. He also has a small nodule in the chest which needs followup  Carotid duplex:Mild disease bilaterally   Lower extremity duplex:  No popliteal aneurysm bilaterally   Assessment:  Abdominal aortic aneurysm, infrarenal Plan: By my measurement, the maximum diameter of his abdominal aortic aneurysm is approximately 5.1 cm. I am concerned that he has had a significant increase in the size of his aneurysm based on his ultrasound a year ago. For that reason I have recommended that we proceed with repair. He is going on vacation this week and therefore I have scheduled him for Friday, August 8. We discussed the risks and benefits of surgery which include but are not limited to the risk of cardiopulmonary complications, stroke, death, bleeding, intestinal and lower extremity ischemia. We also discussed the possibility of a type II endoleak. All his questions were answered today.     Eldridge Abrahams, M.D. [facility] [place] Office: [phone] Pager:  [phone]

## 2012-12-16 NOTE — Anesthesia Postprocedure Evaluation (Signed)
  Anesthesia Post-op Note  Patient: Bryan Ford  Procedure(s) Performed: Procedure(s) with comments: ABDOMINAL AORTIC ENDOVASCULAR STENT GRAFT-GORE (N/A) - Ultrasound guided  Patient Location: PACU  Anesthesia Type:General  Level of Consciousness: awake, alert , oriented and patient cooperative  Airway and Oxygen Therapy: Patient Spontanous Breathing  Post-op Pain: none  Post-op Assessment: Post-op Vital signs reviewed, Patient's Cardiovascular Status Stable, Respiratory Function Stable, Patent Airway, No signs of Nausea or vomiting and Pain level controlled  Post-op Vital Signs: stable  Complications: No apparent anesthesia complications

## 2012-12-16 NOTE — Op Note (Signed)
[facility] [place]  Patient name: Bryan Ford MRN: [id number] DOB: 1926/03/24 Sex: male  12/16/2012 Pre-operative Diagnosis: Abdominal aortic aneurysm Post-operative diagnosis:  Same Surgeon:  Eldridge Abrahams Assistants:  Lennie Muckle, PA Procedure:   #1: Endovascular repair of abdominal aortic aneurysm   #2: Distal extension x1   #3: Bilateral ultrasound guided common femoral artery access   #4: Catheter in aorta x2   #5: Abdominal aortogram  Anesthesia:  Gen. Blood Loss:  See anesthesia record Specimens:  None  Findings:  Complete exclusion  Devices used: Main body was a Dealer, primary right 26 x 14 x 12. Ipsilateral right extension was a Gore 20 x 9.5. Contralateral left limb was a Gore 16 x 11.5  Indications:  The patient has been followed for abdominal aortic aneurysm. He now is at the size for repair. Risks and benefits were discussed in the preoperative clinic visit.  Procedure:  The patient was identified in the holding area and taken to [place] [facility]  The patient was then placed supine on the table. general anesthesia was administered.  The patient was prepped and draped in the usual sterile fashion.  A time out was called and antibiotics were administered.  Ultrasound was used to evaluate bilateral common femoral arteries. There were widely patent. Digital images were acquired. A #11 blade was used to make a skin nick bilaterally. An 18-gauge needle was used to cannulate bilateral common femoral arteries under ultrasound guidance. An 035 wires were advanced bilaterally. An 8 [place] dilator was used to dilate the subcutaneous tract. Pro-glide devices were used for pre-closure. They were placed at the 11:00 and 1:00 position. 8 French sheaths were placed bilaterally, and the patient was fully heparinized. An omni-flush catheter was placed at the level of L1 from the left groin. Over a Amplatz superstiff wire, an 15 French sheath was advanced up the  right leg into the abdominal aorta under fluoroscopic visualization. The image detector was rotated to a 15 cranial position. An abdominal aortogram was used to to locate the renal arteries. The main body device was prepared on the back table. This was a Dealer 26 x 14 x 12 device. It was advanced through the sheath and then deployed down to the contralateral gate. The contralateral gate was cannulated with a Kumpe catheter and a Benson wire. The Kumpe catheter was removed and an omni-flush catheter was able to be freely rotated within the main body of the device, confirming successful cannulation. An Amplatz superstiff wire was then placed through the catheter. The image detector was rotated to a right anterior oblique position and a retrograde injection through the sheath was performed, locating the left hypogastric artery. A 12 French sheath was then advanced over the Amplatz wire into the contralateral gate. The contralateral limb was then prepared back table this was a Gore 16 x 11.5 device. It was advanced to the sheath and then deployed landing at the level of the left hypogastric artery. Next the remaining portion of the ipsilateral limb was deployed. The image detector was rotated to a left anterior oblique position. A retrograde injection through the sheath was performed, locating the right hypogastric artery. The ipsilateral extension was apparent on the back table. This was a Gore 20 x 9.5 device. The 38 French sheath was then advanced within the ipsilateral limb. The ipsilateral distal extension was then advanced through the sheath and placed an appropriate position. The sheath was withdrawn and the device  was deployed, landing at the level of the right hypogastric artery. Next the Aurora Q-50 balloon was used to mold the proximal and distal attachment sites as well as the device overlap. A completion arteriogram was then performed which showed successful exclusion of the aneurysm. There was a  late type II leak. Both renal arteries remained widely patent as did bilateral hypogastric and external iliac arteries. With this, satisfied with our repair stiff wires were exchanged out for [facility] wires. The sheaths were removed and the pro-glide devices were used to seal the artery. 50 mg of protamine was administered. Hemostasis was excellent. The skin incisions were closed with 4-0 Vicryl. Dermabond was applied. The patient continued to have palpable pedal pulses after the case. He is successfully extubated and taken to recovery room in stable condition.   Disposition:  To PACU in stable condition.   Theotis Burrow, M.D. [facility] [place] Office: [phone] Pager:  [phone]

## 2012-12-16 NOTE — Progress Notes (Signed)
Utilization review completed.  

## 2012-12-17 LAB — CBC
HCT: 27.7 % — ABNORMAL LOW (ref 39.0–52.0)
RDW: 14.3 % (ref 11.5–15.5)
WBC: 11.5 10*3/uL — ABNORMAL HIGH (ref 4.0–10.5)

## 2012-12-17 LAB — BASIC METABOLIC PANEL
BUN: 19 mg/dL (ref 6–23)
Chloride: 108 mEq/L (ref 96–112)
GFR calc Af Amer: 49 mL/min — ABNORMAL LOW (ref >90)
Potassium: 4 mEq/L (ref 3.5–5.1)
Sodium: 139 mEq/L (ref 135–145)

## 2012-12-17 MED ORDER — OXYCODONE-ACETAMINOPHEN 5-325 MG PO TABS: 1.0000 | ORAL_TABLET | Freq: Four times a day (QID) | ORAL | Status: DC | PRN

## 2012-12-17 NOTE — Progress Notes (Addendum)
[facility] [place]  Post-op EVAR Date of Surgery: 12/16/2012 Surgeon: Surgeon(s): Serafina Mitchell, MD POD: 1 Day Post-Op Device: Endoleak: Yes  History of Present Illness  Bryan Ford is a 79 y.o. male who is s/p EVAR. The patient denies back pain; denies abdominal pain; denies lower extremity pain.  He is Ambulating and taking PO well without nausea or vomiting. Pt. has not voided with foley out  IMAGING: Dg Chest Portable 1 View  12/16/2012   *RADIOLOGY REPORT*  Clinical Data: Status post endograft placement.  PORTABLE CHEST - 1 VIEW  Comparison: 12/09/2012  Findings: 1038 hours.  Right IJ sheath is noted.  Lung volumes are low.  There is vascular congestion without edema or focal lung consolidation.  Left greater than right basilar atelectasis noted. The cardiopericardial silhouette is enlarged. Telemetry leads overlie the chest.  IMPRESSION: Low volume film with cardiomegaly and vascular congestion.   Original Report Authenticated By: Misty Stanley, M.D.    Significant Diagnostic Studies: CBC Lab Results  Component Value Date   WBC 11.5* 12/17/2012   HGB 9.5* 12/17/2012   HCT 27.7* 12/17/2012   MCV 90.2 12/17/2012   PLT 190 12/17/2012     BMET    Component Value Date/Time   NA 139 12/17/2012 0520   K 4.0 12/17/2012 0520   CL 108 12/17/2012 0520   CO2 23 12/17/2012 0520   GLUCOSE 130* 12/17/2012 0520   BUN 19 12/17/2012 0520   CREATININE 1.45* 12/17/2012 0520   CREATININE 1.47* 11/14/2012 1359   CALCIUM 8.2* 12/17/2012 0520   GFRNONAA 42* 12/17/2012 0520   GFRAA 49* 12/17/2012 0520    COAG Lab Results  Component Value Date   INR 1.19 12/16/2012   INR 1.02 12/09/2012   No results found for this basename: PTT     I/O last 3 completed shifts: In: 8295 [P.O.:600; I.V.:3500; Other:200; IV Piggyback:50] Out: 1025 [Urine:925; Blood:100] No data found.   Physical Examination  BP Readings from Last 3 Encounters:  12/17/12 118/41  12/17/12 118/41  12/11/12 156/65    Temp Readings from Last 3 Encounters:  12/17/12 97.9 F (36.6 C) Oral  12/17/12 97.9 F (36.6 C) Oral  12/11/12 98 F (36.7 C)    SpO2 Readings from Last 3 Encounters:  12/17/12 96%  12/17/12 96%  12/11/12 99%   Pulse Readings from Last 3 Encounters:  12/17/12 57  12/16/12 42  12/17/12 57    General: A&O x 3, WDWN male in NAD Gait: Normal Pulmonary: normal non-labored breathing  Cardiac: RRR Abdomen: soft, NT, NABS Bilateral groin wounds: clean, dry, intact, without hematoma Vascular Exam/Pulses:Palpable PT pulses bilateral  Extremities without ischemic changes, no Gangrene , no cellulitis; no open wounds;   Neurologic: A&O X 3; Appropriate Affect  Assessment: Bryan Ford is a 79 y.o. male who is 1 Day Post-Op EVAR.  Pt is doing well with no complaints  Plan: Home   A CTA of abdomen and pelvis will be scheduled for one month to assess for endoleak.  The patient will follow up with [place] in one month with these studies. He has to void prior to being discharged home.  SignedLaurence Slate [facility]  12/17/2012 8:23 AM.   Postop day 1 status post percutaneous endovascular aneurysm repair The patient has no complaints this morning Both groin incisions are soft. He has palpable pedal pulses  The patient is stable for discharge. He will followup in one month with a CT angiogram of the  abdomen and pelvis.  [name]

## 2012-12-17 NOTE — Discharge Summary (Signed)
I agree with the above  Bryan Ford 

## 2012-12-17 NOTE — Progress Notes (Signed)
Discharge:   Patient discharged via wheelchair by writer at 1440 with family.

## 2012-12-17 NOTE — Discharge Summary (Signed)
[facility] Discharge Summary   Patient ID:  Bryan Ford MRN: [id number] DOB/AGE: 77-Jul-1927 77 y.o.  Admit date: 12/16/2012 Discharge date: 12/17/2012 Date of Surgery: 12/16/2012 Surgeon: Surgeon(s): Serafina Mitchell, MD  Admission Diagnosis: Abdominal Aortic Aneurysm  Discharge Diagnoses:  Abdominal Aortic Aneurysm  Secondary Diagnoses: Past Medical History  Diagnosis Date  . Ulcerative colitis     Takes mesalamine  . Hyperlipidemia   . Chronic kidney disease, stage 3   . Esophageal reflux   . ED (erectile dysfunction)   . AAA (abdominal aortic aneurysm) without rupture     Abdominal ultrasound showed an increase from 4.1 to 5.3 cm diameter AAA.  Marland Kitchen Borderline hypertension     Procedure(s): ABDOMINAL AORTIC ENDOVASCULAR STENT GRAFT-GORE  Discharged Condition: good  HPI: This is a very pleasant 79 year old gentleman who is referred to me for evaluation and management of an abdominal aortic aneurysm. The patient states that his aneurysm was found incidentally on a CT scan one year ago. Power ultrasound has revealed a significant increase in the size of his aneurysm from 4.1-5.3 over the past year. He remained asymptomatic. He denies abdominal pain or back pain.  The patient has been very healthy throughout her course was life. He does suffer from hypercholesterolemia which is treated with a statin. He has borderline hypertension and stage III chronic renal insufficiency. He has a history of smoking cigars but has not done so for 30 or 40 years. There is no family history of aneurysmal disease.  He had Endovascular repair of abdominal aortic aneurysm and Abdominal aortogram. His stay has been uneventful. He has not independently voided yet. Once he is able to void he will be discharged home today    Hospital Course:  Bryan Ford is a 79 y.o. male is S/P  Procedure(s): ABDOMINAL AORTIC ENDOVASCULAR STENT GRAFT-GORE Extubated: POD # 0 Physical exam:  Palpable PT pulses bilateral Post-op wounds clean, dry, intact or healing well Pt. Ambulating, voiding and taking PO diet without difficulty. Pt pain controlled with PO pain meds. Labs as below Complications:none  Consults:     Significant Diagnostic Studies: CBC Lab Results  Component Value Date   WBC 11.5* 12/17/2012   HGB 9.5* 12/17/2012   HCT 27.7* 12/17/2012   MCV 90.2 12/17/2012   PLT 190 12/17/2012    BMET    Component Value Date/Time   NA 139 12/17/2012 0520   K 4.0 12/17/2012 0520   CL 108 12/17/2012 0520   CO2 23 12/17/2012 0520   GLUCOSE 130* 12/17/2012 0520   BUN 19 12/17/2012 0520   CREATININE 1.45* 12/17/2012 0520   CREATININE 1.47* 11/14/2012 1359   CALCIUM 8.2* 12/17/2012 0520   GFRNONAA 42* 12/17/2012 0520   GFRAA 49* 12/17/2012 0520   COAG Lab Results  Component Value Date   INR 1.19 12/16/2012   INR 1.02 12/09/2012     Disposition:  Discharge to :Home Discharge Orders   Future Appointments Provider Department Dept Phone   01/16/2013 10:15 AM Serafina Mitchell, MD [facility] [phone]   05/29/2013 8:00 AM [facility] [place] [facility] [place] [phone]   Patient to arrive 15 minutes prior to appointment time.   05/29/2013 9:00 AM [facility] [phone]   05/29/2013 10:00 AM [facility] 1 [facility] [phone]   05/29/2013 11:00 AM Serafina Mitchell, MD [facility] [phone]   Future Orders  Complete By Expires     Call MD for:  redness, tenderness, or signs of infection (pain, swelling, bleeding, redness, odor or green/yellow discharge around incision site)  As directed     Call MD for:  severe or increased pain, loss or decreased feeling  in affected limb(s)  As directed     Call MD for:  temperature >100.5  As directed     Driving Restrictions  As directed     Comments:      No driving for 2 weeks    Increase  activity slowly  As directed     Comments:      Walk with assistance use walker or cane as needed    Lifting restrictions  As directed     Comments:      No lifting for 12 weeks    May shower   As directed     Scheduling Instructions:      Remove dressing tomorrow and shower with soap and water    Resume previous diet  As directed         Medication List         hydrALAZINE 50 MG tablet  Commonly known as:  APRESOLINE  Take 1 tablet (50 mg total) by mouth 2 (two) times daily.     mesalamine 1.2 G EC tablet  Commonly known as:  LIALDA  Take 2,400 mg by mouth daily with breakfast.     mupirocin ointment 2 %  Commonly known as:  BACTROBAN  1 Application, nasally, 2 times daily for 5 days prior to surgery     oxyCODONE-acetaminophen 5-325 MG per tablet  Commonly known as:  PERCOCET/ROXICET  Take 1-2 tablets by mouth every 6 (six) hours as needed for pain.       Verbal and written Discharge instructions given to the patient. Wound care per Discharge AVS     Follow-up Information   Follow up with Trula Slade IV, [place], MD In 4 weeks.   Contact information:   [place] [address] [phone]       Signed: Laurence Slate [facility] 12/17/2012, 8:37 AM  - For VQI Registry use --- Instructions: Press F2 to tab through selections.  Delete question if not applicable.   Post-op:  Time to Extubation: [ ]  In OR, [ ]  < 12 hrs, [ ]  12-24 hrs, [ ]  >=24 hrs Vasopressors Req. Post-op: No MI: [ ]  No, [ ]  Troponin only, [ ]  EKG or Clinical New Arrhythmia: No CHF: No ICU Stay: 0 days Transfusion: No  If yes, 0 units given  Complications: Resp failure: [ ]  none, [ ]  Pneumonia, [ ]  Ventilator Chg in renal function: [ ]  none, [ ]  Inc. Cr > 0.5, [ ]  Temp. Dialysis, [ ]  Permanent dialysis Leg ischemia: [ ]  No, [ ]  Yes, no Surgery needed, [ ]  Yes, Surgery needed, [ ]  Amputation Bowel ischemia: [ ]  No, [ ]  Medical Rx, [ ]  Surgical Rx Wound complication: [ ]  No, [ ]   Superficial separation/infection, [ ]  Return to OR Return to OR: No  Return to OR for bleeding: No Stroke: [x ] None, [ ]  Minor, [ ]  Major  Discharge medications: Statin use:  yes ASA use:  No  for medical reason   Plavix use:  No  for medical reason   Beta blocker use:  No  for medical reason

## 2012-12-19 ENCOUNTER — Encounter (HOSPITAL_COMMUNITY): Payer: Self-pay | Admitting: Surgery

## 2012-12-19 ENCOUNTER — Other Ambulatory Visit: Payer: Self-pay | Admitting: *Deleted

## 2012-12-19 DIAGNOSIS — I714 Abdominal aortic aneurysm, without rupture: Secondary | ICD-10-CM

## 2012-12-19 DIAGNOSIS — Z48812 Encounter for surgical aftercare following surgery on the circulatory system: Secondary | ICD-10-CM

## 2012-12-23 ENCOUNTER — Ambulatory Visit (INDEPENDENT_AMBULATORY_CARE_PROVIDER_SITE_OTHER): Payer: Medicare Other | Admitting: Vascular Surgery

## 2012-12-23 ENCOUNTER — Other Ambulatory Visit: Payer: Self-pay | Admitting: *Deleted

## 2012-12-23 ENCOUNTER — Encounter: Payer: Self-pay | Admitting: Vascular Surgery

## 2012-12-23 ENCOUNTER — Encounter (INDEPENDENT_AMBULATORY_CARE_PROVIDER_SITE_OTHER): Payer: Medicare Other | Admitting: *Deleted

## 2012-12-23 VITALS — BP 149/73 | HR 56 | Resp 18 | Ht 67.5 in | Wt 163.0 lb

## 2012-12-23 DIAGNOSIS — M549 Dorsalgia, unspecified: Secondary | ICD-10-CM | POA: Insufficient documentation

## 2012-12-23 DIAGNOSIS — I714 Abdominal aortic aneurysm, without rupture: Secondary | ICD-10-CM

## 2012-12-23 DIAGNOSIS — Z48812 Encounter for surgical aftercare following surgery on the circulatory system: Secondary | ICD-10-CM

## 2012-12-23 NOTE — Progress Notes (Signed)
[facility] [place]  Post-operative EVAR  History of Present Illness  Bryan Ford is a 79 y.o. male who presents post-op s/p EVAR (Date: 12/16/12).  Most recent EVAR duplex (Date: 12/23/2012  demonstrates: no endoleak and slight decrease in sac size. The patient has moderate to severe lower back pain.  Pt states the pain was present since the procedure.  For VQI Use Only  PRE-ADM LIVING: Home  AMB STATUS: Ambulatory  Physical Examination  Filed Vitals:   12/23/12 1451  BP: 149/73  Pulse: 56  Resp: 18   Gastrointestinal: soft, NTND, -G/R, - HSM, - masses, - CVAT B, no palpable AAA  Musculoskeletal: no lumbar paraspinal muscle TTP  Non-Invasive Vascular Imaging  EVAR Duplex (12/23/2012):  4.4 cm x 4.3 cm sac  No endoleak  Medical Decision Making  Bryan Ford is a 79 y.o. male who presents s/p EVAR with back pain of unknown etiology.  I suspect the patient's back pain is not related to his AAA.  His physical exam and ultrasound are not consistent with a leaking AAA.    I have ordered a stat CTA abd/pelvis better evaluate the endograft and L-spine to but it cannot be completed today.  I recommended the patient go to the ER to obtain the CTA, but he is not interested in such.  The CTA will be complete as early as Monday.  If his back pain progresses, he has agreed to go to the ER for further evaluation.  The patient has regular schedule follow-up with Dr. Trula Slade.  Adele Barthel, MD [facility]: [phone] Pager: [phone]  12/23/2012, 5:29 PM

## 2012-12-26 ENCOUNTER — Ambulatory Visit
Admission: RE | Admit: 2012-12-26 | Discharge: 2012-12-26 | Disposition: A | Discharge status: Home / Self Care | Payer: Medicare Other | Source: Ambulatory Visit | Attending: Vascular Surgery | Admitting: Vascular Surgery

## 2012-12-26 DIAGNOSIS — I714 Abdominal aortic aneurysm, without rupture: Secondary | ICD-10-CM | POA: Diagnosis not present

## 2012-12-26 MED ORDER — IOHEXOL 350 MG/ML SOLN
60.0000 mL | Freq: Once | INTRAVENOUS | Status: AC | PRN
  Administered 2012-12-26: 60 mL via INTRAVENOUS

## 2012-12-30 ENCOUNTER — Telehealth: Payer: Self-pay | Admitting: Vascular Surgery

## 2012-12-30 NOTE — Telephone Encounter (Signed)
Per Dr. Bridgett Larsson the CTA for 01/16/13 is not needed.  Notified front desk to cancel the CTA.

## 2013-01-13 ENCOUNTER — Encounter: Payer: Self-pay | Admitting: Surgery

## 2013-01-16 ENCOUNTER — Ambulatory Visit (INDEPENDENT_AMBULATORY_CARE_PROVIDER_SITE_OTHER): Payer: Self-pay | Admitting: Surgery

## 2013-01-16 ENCOUNTER — Other Ambulatory Visit: Payer: Medicare Other

## 2013-01-16 ENCOUNTER — Encounter: Payer: Self-pay | Admitting: Surgery

## 2013-01-16 VITALS — BP 156/47 | HR 43 | Temp 97.7°F | Ht 67.5 in | Wt 161.0 lb

## 2013-01-16 DIAGNOSIS — I714 Abdominal aortic aneurysm, without rupture: Secondary | ICD-10-CM

## 2013-01-16 NOTE — Progress Notes (Signed)
[facility] POST OPERATIVE OFFICE NOTE  CC:  F/u for surgery  HPI:  This is a 79 y.o. male who is s/p EVAR on 12/16/12.  He presented to Dr. Lianne Moris clinic on 12/23/12 with pain that he had since surgery.  A CTA was performed, which revealed an infrarenal bifurcated aortic stent graft placement with stable 5 cm native aneurysm diameter and small type 2 endoleak.  It also revealed cholelithiasis.  Pt states that his issues have resolved and he believes it was a bowel problem.  He states that he has had no problems since then.    No Known Allergies  Current Outpatient Prescriptions  Medication Sig Dispense Refill  . hydrALAZINE (APRESOLINE) 50 MG tablet Take 1 tablet (50 mg total) by mouth 2 (two) times daily.  60 tablet  5  . mesalamine (LIALDA) 1.2 G EC tablet Take 2,400 mg by mouth daily with breakfast.      . mupirocin ointment (BACTROBAN) 2 % 1 Application, nasally, 2 times daily for 5 days prior to surgery  22 g  0  . oxyCODONE-acetaminophen (PERCOCET/ROXICET) 5-325 MG per tablet Take 1-2 tablets by mouth every 6 (six) hours as needed for pain.  20 tablet  0   No current facility-administered medications for this visit.     ROS:  See HPI  Physical Exam:  Filed Vitals:   01/16/13 1019  BP: 156/47  Pulse: 43  Temp: 97.7 F (36.5 C)    Incision:  Bilateral groins are soft with palpable femoral pulses Extremities:  BLE with no edema; + palpable PT pulses bilaterally Abdomen:  Soft/NT/ND   A/P:  This is a 79 y.o. male here for f/u from EVAR 12/16/12.  Pt is doing well and his pain issues have resolved.   -CT of chest in July revealed lung nodule at high risk for CA.  Recommend CT f/u in 3-6 months.  Pt also needs CTA s/p EVAR protocol in 6 months.  To minimize scans, will have pt return in 4 months for CTA chest/abdomen/pelvis.  Leontine Locket, PA-C [facility] [phone]  Clinic MD:  Pt seen and examined with Dr. Trula Slade   I agree with the  above. The patient has been seen and examined. He presented 2 weeks status post endovascular aneurysm repair with abdominal pain. A CT angiogram was performed at that time which showed excellent position of the stent graft with a small type II endoleak. He is back today stating that the discomfort has completely resolved. He is back playing golf. He has no complaints.  He has palpable femoral pulses in the groin incisions are healing nicely.  On the patient's CT scan in July a 7 mm pulmonary nodule was identified and followup was recommended. I discussed this with the patient and his wife again today. I have scheduled him for a CT angiogram of the chest abdomen and pelvis and November to evaluate the lung nodule as well as the endoleak.  [name]

## 2013-01-18 ENCOUNTER — Other Ambulatory Visit: Payer: Self-pay | Admitting: *Deleted

## 2013-01-18 DIAGNOSIS — I714 Abdominal aortic aneurysm, without rupture: Secondary | ICD-10-CM

## 2013-01-18 DIAGNOSIS — R918 Other nonspecific abnormal finding of lung field: Secondary | ICD-10-CM

## 2013-01-18 DIAGNOSIS — Z48812 Encounter for surgical aftercare following surgery on the circulatory system: Secondary | ICD-10-CM

## 2013-01-23 DIAGNOSIS — H908 Mixed conductive and sensorineural hearing loss, unspecified: Secondary | ICD-10-CM | POA: Diagnosis not present

## 2013-02-03 DIAGNOSIS — K5150 Left sided colitis without complications: Secondary | ICD-10-CM | POA: Diagnosis not present

## 2013-02-16 DIAGNOSIS — R52 Pain, unspecified: Secondary | ICD-10-CM | POA: Diagnosis not present

## 2013-02-16 DIAGNOSIS — H9210 Otorrhea, unspecified ear: Secondary | ICD-10-CM | POA: Diagnosis not present

## 2013-02-16 DIAGNOSIS — H6980 Other specified disorders of Eustachian tube, unspecified ear: Secondary | ICD-10-CM | POA: Diagnosis not present

## 2013-02-16 DIAGNOSIS — H6520 Chronic serous otitis media, unspecified ear: Secondary | ICD-10-CM | POA: Diagnosis not present

## 2013-02-16 DIAGNOSIS — H905 Unspecified sensorineural hearing loss: Secondary | ICD-10-CM | POA: Diagnosis not present

## 2013-03-10 ENCOUNTER — Encounter: Payer: Self-pay | Admitting: Surgery

## 2013-03-13 ENCOUNTER — Ambulatory Visit (INDEPENDENT_AMBULATORY_CARE_PROVIDER_SITE_OTHER): Payer: Self-pay | Admitting: Surgery

## 2013-03-13 ENCOUNTER — Ambulatory Visit
Admission: RE | Admit: 2013-03-13 | Discharge: 2013-03-13 | Disposition: A | Discharge status: Home / Self Care | Payer: Medicare Other | Source: Ambulatory Visit | Attending: Surgery | Admitting: Surgery

## 2013-03-13 ENCOUNTER — Inpatient Hospital Stay: Admission: RE | Admit: 2013-03-13 | Payer: Medicare Other | Source: Ambulatory Visit

## 2013-03-13 ENCOUNTER — Other Ambulatory Visit: Payer: Self-pay | Admitting: Surgery

## 2013-03-13 ENCOUNTER — Encounter: Payer: Self-pay | Admitting: Surgery

## 2013-03-13 VITALS — BP 134/52 | HR 54 | Ht 67.5 in | Wt 161.0 lb

## 2013-03-13 DIAGNOSIS — R222 Localized swelling, mass and lump, trunk: Secondary | ICD-10-CM

## 2013-03-13 DIAGNOSIS — Z48812 Encounter for surgical aftercare following surgery on the circulatory system: Secondary | ICD-10-CM

## 2013-03-13 DIAGNOSIS — I714 Abdominal aortic aneurysm, without rupture, unspecified: Secondary | ICD-10-CM | POA: Insufficient documentation

## 2013-03-13 DIAGNOSIS — R918 Other nonspecific abnormal finding of lung field: Secondary | ICD-10-CM | POA: Insufficient documentation

## 2013-03-13 DIAGNOSIS — J984 Other disorders of lung: Secondary | ICD-10-CM | POA: Diagnosis not present

## 2013-03-13 MED ORDER — IOHEXOL 350 MG/ML SOLN
80.0000 mL | Freq: Once | INTRAVENOUS | Status: AC | PRN
  Administered 2013-03-13: 80 mL via INTRAVENOUS

## 2013-03-13 NOTE — Progress Notes (Signed)
[facility] [place]   Patient name: Bryan Ford MRN: [id number] DOB: 1926/05/02 Sex: male     Chief Complaint  Patient presents with  . AAA    eval lung nodule and endoleak - CTA ches/abd/pelvis prior    HISTORY OF PRESENT ILLNESS: The patient comes in today for followup.  He is status post endovascular aneurysm repair on 12/16/2012.  His initial CT scan shows a small type II endoleak.  The CT scan also a lung nodule at high risk for malignancy.  Followup was recommended at this time.  The patient reports no complaints.  He denies any episodes of pneumonia.  He has had sinus infections  Past Medical History  Diagnosis Date  . Ulcerative colitis     Takes mesalamine  . Hyperlipidemia   . Chronic kidney disease, stage 3   . Esophageal reflux   . ED (erectile dysfunction)   . AAA (abdominal aortic aneurysm) without rupture     Abdominal ultrasound showed an increase from 4.1 to 5.3 cm diameter AAA.  Marland Kitchen Borderline hypertension     Past Surgical History  Procedure Laterality Date  . Kidney stone surgery  1970s  . Tonsillectomy    . Mandible fracture surgery  college  . Abdominal aortic endovascular stent graft N/A 12/16/2012    Procedure: ABDOMINAL AORTIC ENDOVASCULAR STENT GRAFT-GORE;  Surgeon: Serafina Mitchell, MD;  Location: [place];  Service: Vascular;  Laterality: N/A;  Ultrasound guided    History   Social History  . Marital Status: Married    Spouse Name: Or rub    Number of Children: 3  . Years of Education: N/A   Occupational History  . Retired At [employer]   Social History Main Topics  . Smoking status: Former Smoker    Types: Cigarettes, Cigars    Quit date: 11/01/1973  . Smokeless tobacco: Never Used  . Alcohol Use: 1 - 1.5 oz/week    2-3 drink(s) per week     Comment: occasionally, but not every week  . Drug Use: No  . Sexual Activity: Not on file   Other Topics Concern  . Not on file   Social History Narrative   7  range of motion. Very is worse on the ER all day, including mowing the lawn. Also plays golf and walks regularly.    Family History  Problem Relation Age of Onset  . Cancer Mother     Allergies as of 03/13/2013  . (No Known Allergies)    Current Outpatient Prescriptions on File Prior to Visit  Medication Sig Dispense Refill  . mesalamine (LIALDA) 1.2 G EC tablet Take 2,400 mg by mouth daily with breakfast.      . hydrALAZINE (APRESOLINE) 50 MG tablet Take 1 tablet (50 mg total) by mouth 2 (two) times daily.  60 tablet  5  . mupirocin ointment (BACTROBAN) 2 % 1 Application, nasally, 2 times daily for 5 days prior to surgery  22 g  0  . oxyCODONE-acetaminophen (PERCOCET/ROXICET) 5-325 MG per tablet Take 1-2 tablets by mouth every 6 (six) hours as needed for pain.  20 tablet  0   No current facility-administered medications on file prior to visit.     REVIEW OF SYSTEMS: Please see history of present illness, otherwise negative  PHYSICAL EXAMINATION:   Vital signs are BP 134/52  Pulse 54  Ht 5' 7.5" (1.715 m)  Wt 161 lb (73.029 kg)  BMI 24.83  kg/m2  SpO2 100% General: The patient appears their stated age. HEENT:  No gross abnormalities Pulmonary:  Non labored breathing Abdomen: Soft and non-tender Musculoskeletal: There are no major deformities. Neurologic: No focal weakness or paresthesias are detected, Skin: There are no ulcer or rashes noted. Psychiatric: The patient has normal affect. Cardiovascular: There is a regular rate and rhythm without significant murmur appreciated.   Diagnostic Studies I have reviewed his CT scan today.  He has a residual type II endoleak.  There is been no significant change in the maximum aortic diameter, which measures 4.9-5 cm.  I discussed with radiology the chest CT.  This appears to be more inflammatory vs. infectious.  A repeat image was requested for 2 months.  Assessment: Status post endovascular aneurysm repair Lung  nodule Plan: Aneurysm: The patient will come back in 6 months for an ultrasound.  He has a persistent type II endoleak, however there is been no sac size expansion.  I discussed these findings with the patient.  As long as his aneurysm does not change in size, I would recommend no intervention.  Lung nodule: After discussing this with radiology today, I will repeat his CT scan in 2 months.  I've also asked him to check with her primary care doctor to see if any additional status need to be taken.  I will consider referring him to pulmonology in 2 months pending the results of his CT scan.  Eldridge Abrahams, M.D. [facility] [place] Office: [phone] Pager:  [phone]

## 2013-03-14 ENCOUNTER — Encounter (HOSPITAL_COMMUNITY): Payer: Medicare Other

## 2013-03-14 DIAGNOSIS — Z23 Encounter for immunization: Secondary | ICD-10-CM | POA: Diagnosis not present

## 2013-03-14 NOTE — Addendum Note (Signed)
Addended by: Peter Minium K on: 03/14/2013 08:30 AM   Modules accepted: Orders

## 2013-04-11 DIAGNOSIS — J331 Polypoid sinus degeneration: Secondary | ICD-10-CM | POA: Diagnosis not present

## 2013-04-11 DIAGNOSIS — H908 Mixed conductive and sensorineural hearing loss, unspecified: Secondary | ICD-10-CM | POA: Diagnosis not present

## 2013-04-11 DIAGNOSIS — H6980 Other specified disorders of Eustachian tube, unspecified ear: Secondary | ICD-10-CM | POA: Diagnosis not present

## 2013-05-25 ENCOUNTER — Other Ambulatory Visit: Payer: Self-pay | Admitting: Surgery

## 2013-05-25 DIAGNOSIS — I714 Abdominal aortic aneurysm, without rupture, unspecified: Secondary | ICD-10-CM | POA: Diagnosis not present

## 2013-05-25 LAB — BUN: BUN: 17 mg/dL (ref 6–23)

## 2013-05-25 LAB — CREATININE, SERUM: Creat: 1.15 mg/dL (ref 0.50–1.35)

## 2013-05-26 ENCOUNTER — Encounter: Payer: Self-pay | Admitting: Surgery

## 2013-05-29 ENCOUNTER — Other Ambulatory Visit (HOSPITAL_COMMUNITY): Payer: Medicare Other

## 2013-05-29 ENCOUNTER — Ambulatory Visit
Admission: RE | Admit: 2013-05-29 | Discharge: 2013-05-29 | Disposition: A | Discharge status: Home / Self Care | Payer: Medicare Other | Source: Ambulatory Visit | Attending: Surgery | Admitting: Surgery

## 2013-05-29 ENCOUNTER — Ambulatory Visit (INDEPENDENT_AMBULATORY_CARE_PROVIDER_SITE_OTHER): Payer: Medicare Other | Admitting: Surgery

## 2013-05-29 ENCOUNTER — Other Ambulatory Visit: Payer: Self-pay | Admitting: Surgery

## 2013-05-29 ENCOUNTER — Encounter: Payer: Self-pay | Admitting: Surgery

## 2013-05-29 VITALS — BP 180/78 | HR 54 | Ht 67.5 in | Wt 165.0 lb

## 2013-05-29 DIAGNOSIS — R222 Localized swelling, mass and lump, trunk: Secondary | ICD-10-CM

## 2013-05-29 DIAGNOSIS — I714 Abdominal aortic aneurysm, without rupture, unspecified: Secondary | ICD-10-CM | POA: Diagnosis not present

## 2013-05-29 DIAGNOSIS — R918 Other nonspecific abnormal finding of lung field: Secondary | ICD-10-CM

## 2013-05-29 DIAGNOSIS — J984 Other disorders of lung: Secondary | ICD-10-CM | POA: Diagnosis not present

## 2013-05-29 DIAGNOSIS — Z48812 Encounter for surgical aftercare following surgery on the circulatory system: Secondary | ICD-10-CM

## 2013-05-29 MED ORDER — IOHEXOL 350 MG/ML SOLN
80.0000 mL | Freq: Once | INTRAVENOUS | Status: AC | PRN
  Administered 2013-05-29: 80 mL via INTRAVENOUS

## 2013-05-29 NOTE — Addendum Note (Signed)
Addended by: Mena Goes on: 05/29/2013 02:49 PM   Modules accepted: Orders

## 2013-05-29 NOTE — Progress Notes (Signed)
Patient name: Bryan Ford MRN: [id number] DOB: 1926/03/16 Sex: male     Chief Complaint  Patient presents with  . Re-evaluation    6 month f/u CT prior    HISTORY OF PRESENT ILLNESS: The patient comes in today for followup. He is status post endovascular aneurysm repair on 12/16/2012. His initial CT scan  a small type II endoleak. The CT scan also a lung nodule at high risk for malignancy.  He reports no complaints since I last saw him.   Past Medical History  Diagnosis Date  . Ulcerative colitis     Takes mesalamine  . Hyperlipidemia   . Chronic kidney disease, stage 3   . Esophageal reflux   . ED (erectile dysfunction)   . AAA (abdominal aortic aneurysm) without rupture     Abdominal ultrasound showed an increase from 4.1 to 5.3 cm diameter AAA.  Marland Kitchen Borderline hypertension   . AAA (abdominal aortic aneurysm)     Past Surgical History  Procedure Laterality Date  . Kidney stone surgery  1970s  . Tonsillectomy    . Mandible fracture surgery  college  . Abdominal aortic endovascular stent graft N/A 12/16/2012    Procedure: ABDOMINAL AORTIC ENDOVASCULAR STENT GRAFT-GORE;  Surgeon: Serafina Mitchell, MD;  Location: [facility];  Service: Vascular;  Laterality: N/A;  Ultrasound guided    History   Social History  . Marital Status: Married    Spouse Name: Or rub    Number of Children: 3  . Years of Education: N/A   Occupational History  . Retired At [employer]   Social History Main Topics  . Smoking status: Former Smoker    Types: Cigarettes, Cigars    Quit date: 11/01/1973  . Smokeless tobacco: Never Used  . Alcohol Use: 1 - 1.5 oz/week    2-3 drink(s) per week     Comment: occasionally, but not every week  . Drug Use: No  . Sexual Activity: Not on file   Other Topics Concern  . Not on file   Social History Narrative   7 range of motion. Very is worse on the ER all day, including mowing the lawn. Also plays golf and walks regularly.    Family  History  Problem Relation Age of Onset  . Cancer Mother     Allergies as of 05/29/2013  . (No Known Allergies)    Current Outpatient Prescriptions on File Prior to Visit  Medication Sig Dispense Refill  . mesalamine (LIALDA) 1.2 G EC tablet Take 2,400 mg by mouth daily with breakfast.      . hydrALAZINE (APRESOLINE) 50 MG tablet Take 1 tablet (50 mg total) by mouth 2 (two) times daily.  60 tablet  5  . mupirocin ointment (BACTROBAN) 2 % 1 Application, nasally, 2 times daily for 5 days prior to surgery  22 g  0  . oxyCODONE-acetaminophen (PERCOCET/ROXICET) 5-325 MG per tablet Take 1-2 tablets by mouth every 6 (six) hours as needed for pain.  20 tablet  0   No current facility-administered medications on file prior to visit.     REVIEW OF SYSTEMS: No interval changes  PHYSICAL EXAMINATION:   Vital signs are BP 180/78  Pulse 54  Ht 5' 7.5" (1.715 m)  Wt 165 lb (74.844 kg)  BMI 25.45 kg/m2  SpO2 100% General: The patient appears their stated age. HEENT:  No gross abnormalities Pulmonary:  Non labored breathing Abdomen: Soft and non-tender.  Aneurysm is not palpable Musculoskeletal: There are no major deformities. Neurologic: No focal weakness or paresthesias are detected, Skin: There are no ulcer or rashes noted. Psychiatric: The patient has normal affect. Cardiovascular: There is a regular rate and rhythm without significant murmur appreciated.   Diagnostic Studies I have reviewed his CT scan.  No significant change in the aneurysm sac.  Small subtle type II endoleak.  CT chest: Suggest inflammatory changes.  Assessment: Status post endovascular aneurysm repair Lung nodule Plan: Aneurysm: I discussed the subtle type II endoleak with no significant change aneurysm sac today with the patient and his wife.  I have recommended ultrasound surveillance in 6 months.  Lung nodule: Imaging favors inflammatory etiology today.  I have elected to repeat a scan in 6 months.  I  have also told him to discuss this with his primary care physician so that this can be followed in the future.  Eldridge Abrahams, M.D. [facility] Office: [phone] Pager:  [phone]

## 2013-07-10 DIAGNOSIS — H6980 Other specified disorders of Eustachian tube, unspecified ear: Secondary | ICD-10-CM | POA: Diagnosis not present

## 2013-07-10 DIAGNOSIS — J331 Polypoid sinus degeneration: Secondary | ICD-10-CM | POA: Diagnosis not present

## 2013-07-10 DIAGNOSIS — H908 Mixed conductive and sensorineural hearing loss, unspecified: Secondary | ICD-10-CM | POA: Diagnosis not present

## 2013-08-09 DIAGNOSIS — J189 Pneumonia, unspecified organism: Secondary | ICD-10-CM

## 2013-08-09 HISTORY — DX: Pneumonia, unspecified organism: J18.9

## 2013-09-04 ENCOUNTER — Other Ambulatory Visit: Payer: Medicare Other

## 2013-09-08 ENCOUNTER — Encounter: Payer: Self-pay | Admitting: Surgery

## 2013-09-11 ENCOUNTER — Ambulatory Visit (HOSPITAL_COMMUNITY)
Admission: RE | Admit: 2013-09-11 | Discharge: 2013-09-11 | Disposition: A | Discharge status: Home / Self Care | Payer: Medicare Other | Source: Ambulatory Visit | Attending: Surgery | Admitting: Surgery

## 2013-09-11 ENCOUNTER — Encounter: Payer: Self-pay | Admitting: Surgery

## 2013-09-11 ENCOUNTER — Ambulatory Visit (INDEPENDENT_AMBULATORY_CARE_PROVIDER_SITE_OTHER): Payer: Medicare Other | Admitting: Surgery

## 2013-09-11 VITALS — BP 181/61 | HR 43 | Ht 67.5 in | Wt 158.6 lb

## 2013-09-11 DIAGNOSIS — Z48812 Encounter for surgical aftercare following surgery on the circulatory system: Secondary | ICD-10-CM | POA: Diagnosis not present

## 2013-09-11 DIAGNOSIS — I714 Abdominal aortic aneurysm, without rupture, unspecified: Secondary | ICD-10-CM | POA: Diagnosis not present

## 2013-09-11 NOTE — Progress Notes (Signed)
Patient name: Bryan Ford MRN: [id number] DOB: Nov 15, 1925 Sex: male     Chief Complaint  Patient presents with  . Re-evaluation    6 month f/u AAA duplex and CTA chest prior    HISTORY OF PRESENT ILLNESS: Patient is back today for followup.  On 12/16/2012, he underwent percutaneous endovascular aneurysm repair using a Gore Excluder device.  I have been following a subtle type II endoleak.  He reports no complaints today.  He does report some weight loss.  Past Medical History  Diagnosis Date  . Ulcerative colitis     Takes mesalamine  . Hyperlipidemia   . Chronic kidney disease, stage 3   . Esophageal reflux   . ED (erectile dysfunction)   . AAA (abdominal aortic aneurysm) without rupture     Abdominal ultrasound showed an increase from 4.1 to 5.3 cm diameter AAA.  Marland Kitchen Borderline hypertension   . AAA (abdominal aortic aneurysm)     Past Surgical History  Procedure Laterality Date  . Kidney stone surgery  1970s  . Tonsillectomy    . Mandible fracture surgery  college  . Abdominal aortic endovascular stent graft N/A 12/16/2012    Procedure: ABDOMINAL AORTIC ENDOVASCULAR STENT GRAFT-GORE;  Surgeon: Serafina Mitchell, MD;  Location: [facility];  Service: Vascular;  Laterality: N/A;  Ultrasound guided    History   Social History  . Marital Status: Married    Spouse Name: Or rub    Number of Children: 3  . Years of Education: N/A   Occupational History  . Retired At [employer]   Social History Main Topics  . Smoking status: Former Smoker    Types: Cigarettes, Cigars    Quit date: 11/01/1973  . Smokeless tobacco: Never Used  . Alcohol Use: 1.0 - 1.5 oz/week    2-3 drink(s) per week     Comment: occasionally, but not every week  . Drug Use: No  . Sexual Activity: Not on file   Other Topics Concern  . Not on file   Social History Narrative   7 range of motion. Very is worse on the ER all day, including mowing the lawn. Also plays golf and walks regularly.     Family History  Problem Relation Age of Onset  . Cancer Mother     Allergies as of 09/11/2013  . (No Known Allergies)    Current Outpatient Prescriptions on File Prior to Visit  Medication Sig Dispense Refill  . hydrALAZINE (APRESOLINE) 50 MG tablet Take 1 tablet (50 mg total) by mouth 2 (two) times daily.  60 tablet  5  . mesalamine (LIALDA) 1.2 G EC tablet Take 2,400 mg by mouth daily with breakfast.      . mupirocin ointment (BACTROBAN) 2 % 1 Application, nasally, 2 times daily for 5 days prior to surgery  22 g  0  . oxyCODONE-acetaminophen (PERCOCET/ROXICET) 5-325 MG per tablet Take 1-2 tablets by mouth every 6 (six) hours as needed for pain.  20 tablet  0   No current facility-administered medications on file prior to visit.     REVIEW OF SYSTEMS: Cardiovascular: No chest pain, chest pressure, palpitations, orthopnea, or dyspnea on exertion. No claudication or rest pain,  No history of DVT or phlebitis. Pulmonary: No productive cough, asthma or wheezing. Neurologic: No weakness, paresthesias, aphasia, or amaurosis. No dizziness. Hematologic: No bleeding problems or clotting disorders. Musculoskeletal: No joint pain or joint swelling. Gastrointestinal: No blood in  stool or hematemesis Genitourinary: No dysuria or hematuria. Psychiatric:: No history of major depression. Integumentary: No rashes or ulcers. Constitutional: No fever or chills.  Positive for weight gain  PHYSICAL EXAMINATION:   Vital signs are BP 181/61  Pulse 43  Ht 5' 7.5" (1.715 m)  Wt 158 lb 9.6 oz (71.94 kg)  BMI 24.46 kg/m2  SpO2 100% General: The patient appears their stated age. HEENT:  No gross abnormalities Pulmonary:  Non labored breathing Abdomen: Soft and non-tender Musculoskeletal: There are no major deformities. Neurologic: No focal weakness or paresthesias are detected, Skin: There are no ulcer or rashes noted. Psychiatric: The patient has normal affect. Cardiovascular: There is a  regular rate and rhythm without significant murmur appreciated.  No carotid bruit   Diagnostic Studies I have ordered and reviewed his ultrasound.  No evidence of type II endoleak was identified.  Aneurysm maximum diameter day was 4.  5, previously it was 4.3.  Assessment: Status post endovascular aneurysm repair Plan: There has been a 1-2 mm increase in the size of his aneurysm.  On CT scan he had a type II endoleak which was subtle.  The endoleak is not visualized today on ultrasound, however I think that a CT scan is needed to better define his aneurysm.  This will be scheduled for 6 months.  I also discussed the chest CT findings.  His initial chest CT suggested an infectious vs. inflammatory vs. malignant process.  Followup CT scan showed a significant improvement.  This would favor an infectious etiology.  I had tried to order another chest CT scan, however this was denied by his insurance, he.  I discussed this with the patient   Based on the interval change of his 2 chest CT scans, this is most likely infectious.   Eldridge Abrahams, M.D. [facility] Office: [phone] Pager:  [phone]

## 2013-09-11 NOTE — Addendum Note (Signed)
Addended by: Peter Minium K on: 09/11/2013 10:30 AM   Modules accepted: Orders

## 2013-10-03 DIAGNOSIS — R5381 Other malaise: Secondary | ICD-10-CM | POA: Diagnosis not present

## 2013-10-03 DIAGNOSIS — K5190 Ulcerative colitis, unspecified, without complications: Secondary | ICD-10-CM | POA: Diagnosis not present

## 2013-10-03 DIAGNOSIS — R5383 Other fatigue: Secondary | ICD-10-CM | POA: Diagnosis not present

## 2013-10-03 DIAGNOSIS — N189 Chronic kidney disease, unspecified: Secondary | ICD-10-CM | POA: Diagnosis present

## 2013-10-03 DIAGNOSIS — E876 Hypokalemia: Secondary | ICD-10-CM | POA: Diagnosis not present

## 2013-10-03 DIAGNOSIS — D631 Anemia in chronic kidney disease: Secondary | ICD-10-CM | POA: Diagnosis present

## 2013-10-03 DIAGNOSIS — F17200 Nicotine dependence, unspecified, uncomplicated: Secondary | ICD-10-CM | POA: Diagnosis present

## 2013-10-03 DIAGNOSIS — E872 Acidosis, unspecified: Secondary | ICD-10-CM | POA: Diagnosis not present

## 2013-10-03 DIAGNOSIS — Z87442 Personal history of urinary calculi: Secondary | ICD-10-CM | POA: Diagnosis not present

## 2013-10-03 DIAGNOSIS — Z79899 Other long term (current) drug therapy: Secondary | ICD-10-CM | POA: Diagnosis not present

## 2013-10-03 DIAGNOSIS — J14 Pneumonia due to Hemophilus influenzae: Secondary | ICD-10-CM | POA: Diagnosis present

## 2013-10-03 DIAGNOSIS — R059 Cough, unspecified: Secondary | ICD-10-CM | POA: Diagnosis not present

## 2013-10-03 DIAGNOSIS — R05 Cough: Secondary | ICD-10-CM | POA: Diagnosis not present

## 2013-10-03 DIAGNOSIS — N179 Acute kidney failure, unspecified: Secondary | ICD-10-CM | POA: Diagnosis not present

## 2013-10-03 DIAGNOSIS — E860 Dehydration: Secondary | ICD-10-CM | POA: Diagnosis not present

## 2013-10-03 DIAGNOSIS — N289 Disorder of kidney and ureter, unspecified: Secondary | ICD-10-CM | POA: Diagnosis not present

## 2013-10-03 DIAGNOSIS — J984 Other disorders of lung: Secondary | ICD-10-CM | POA: Diagnosis not present

## 2013-10-03 DIAGNOSIS — J339 Nasal polyp, unspecified: Secondary | ICD-10-CM | POA: Diagnosis present

## 2013-10-03 DIAGNOSIS — N178 Other acute kidney failure: Secondary | ICD-10-CM | POA: Diagnosis not present

## 2013-10-03 DIAGNOSIS — J159 Unspecified bacterial pneumonia: Secondary | ICD-10-CM | POA: Diagnosis not present

## 2013-10-03 DIAGNOSIS — J189 Pneumonia, unspecified organism: Secondary | ICD-10-CM | POA: Diagnosis not present

## 2013-10-03 DIAGNOSIS — N039 Chronic nephritic syndrome with unspecified morphologic changes: Secondary | ICD-10-CM | POA: Diagnosis present

## 2013-10-03 DIAGNOSIS — D649 Anemia, unspecified: Secondary | ICD-10-CM | POA: Diagnosis not present

## 2013-10-12 DIAGNOSIS — J189 Pneumonia, unspecified organism: Secondary | ICD-10-CM | POA: Diagnosis not present

## 2013-10-12 DIAGNOSIS — N183 Chronic kidney disease, stage 3 unspecified: Secondary | ICD-10-CM | POA: Diagnosis not present

## 2013-11-03 DIAGNOSIS — H6980 Other specified disorders of Eustachian tube, unspecified ear: Secondary | ICD-10-CM | POA: Diagnosis not present

## 2013-11-03 DIAGNOSIS — J331 Polypoid sinus degeneration: Secondary | ICD-10-CM | POA: Diagnosis not present

## 2013-11-03 DIAGNOSIS — H908 Mixed conductive and sensorineural hearing loss, unspecified: Secondary | ICD-10-CM | POA: Diagnosis not present

## 2013-11-03 DIAGNOSIS — H9210 Otorrhea, unspecified ear: Secondary | ICD-10-CM | POA: Diagnosis not present

## 2013-11-05 ENCOUNTER — Encounter (HOSPITAL_COMMUNITY): Payer: Self-pay | Admitting: Emergency Medicine

## 2013-11-05 ENCOUNTER — Emergency Department (HOSPITAL_COMMUNITY): Payer: Medicare Other

## 2013-11-05 ENCOUNTER — Emergency Department (HOSPITAL_COMMUNITY)
Admission: EM | Admit: 2013-11-05 | Discharge: 2013-11-06 | Disposition: A | Discharge status: Home / Self Care | Payer: Medicare Other | Attending: Emergency Medicine | Admitting: Emergency Medicine

## 2013-11-05 DIAGNOSIS — N201 Calculus of ureter: Secondary | ICD-10-CM | POA: Insufficient documentation

## 2013-11-05 DIAGNOSIS — N183 Chronic kidney disease, stage 3 unspecified: Secondary | ICD-10-CM | POA: Diagnosis not present

## 2013-11-05 DIAGNOSIS — Z8679 Personal history of other diseases of the circulatory system: Secondary | ICD-10-CM | POA: Insufficient documentation

## 2013-11-05 DIAGNOSIS — Z8701 Personal history of pneumonia (recurrent): Secondary | ICD-10-CM | POA: Insufficient documentation

## 2013-11-05 DIAGNOSIS — Z862 Personal history of diseases of the blood and blood-forming organs and certain disorders involving the immune mechanism: Secondary | ICD-10-CM | POA: Insufficient documentation

## 2013-11-05 DIAGNOSIS — R319 Hematuria, unspecified: Secondary | ICD-10-CM | POA: Diagnosis not present

## 2013-11-05 DIAGNOSIS — Z8639 Personal history of other endocrine, nutritional and metabolic disease: Secondary | ICD-10-CM | POA: Insufficient documentation

## 2013-11-05 DIAGNOSIS — R918 Other nonspecific abnormal finding of lung field: Secondary | ICD-10-CM | POA: Diagnosis not present

## 2013-11-05 DIAGNOSIS — R1032 Left lower quadrant pain: Secondary | ICD-10-CM | POA: Diagnosis not present

## 2013-11-05 DIAGNOSIS — Z87442 Personal history of urinary calculi: Secondary | ICD-10-CM | POA: Diagnosis not present

## 2013-11-05 DIAGNOSIS — Z8719 Personal history of other diseases of the digestive system: Secondary | ICD-10-CM | POA: Diagnosis not present

## 2013-11-05 DIAGNOSIS — Z79899 Other long term (current) drug therapy: Secondary | ICD-10-CM | POA: Diagnosis not present

## 2013-11-05 DIAGNOSIS — Z87891 Personal history of nicotine dependence: Secondary | ICD-10-CM | POA: Diagnosis not present

## 2013-11-05 LAB — I-STAT CHEM 8, ED
BUN: 30 mg/dL — ABNORMAL HIGH (ref 6–23)
Calcium, Ion: 1.16 mmol/L (ref 1.13–1.30)
Chloride: 109 meq/L (ref 96–112)
Creatinine, Ser: 2.1 mg/dL — ABNORMAL HIGH (ref 0.50–1.35)
Glucose, Bld: 109 mg/dL — ABNORMAL HIGH (ref 70–99)
HCT: 34 % — ABNORMAL LOW (ref 39.0–52.0)
Hemoglobin: 11.6 g/dL — ABNORMAL LOW (ref 13.0–17.0)
Potassium: 3.9 meq/L (ref 3.7–5.3)
Sodium: 144 meq/L (ref 137–147)
TCO2: 23 mmol/L (ref 0–100)

## 2013-11-05 LAB — I-STAT TROPONIN, ED: Troponin i, poc: 0.02 ng/mL (ref 0.00–0.08)

## 2013-11-05 LAB — URINALYSIS, ROUTINE W REFLEX MICROSCOPIC
Bilirubin Urine: NEGATIVE
GLUCOSE, UA: NEGATIVE mg/dL
KETONES UR: NEGATIVE mg/dL
Leukocytes, UA: NEGATIVE
Nitrite: NEGATIVE
PROTEIN: 30 mg/dL — AB
Specific Gravity, Urine: 1.021 (ref 1.005–1.030)
Urobilinogen, UA: 0.2 mg/dL (ref 0.0–1.0)
pH: 5 (ref 5.0–8.0)

## 2013-11-05 LAB — CBC WITH DIFFERENTIAL/PLATELET
BASOS ABS: 0 10*3/uL (ref 0.0–0.1)
BASOS PCT: 0 % (ref 0–1)
EOS PCT: 0 % (ref 0–5)
Eosinophils Absolute: 0 10*3/uL (ref 0.0–0.7)
HEMATOCRIT: 32.7 % — AB (ref 39.0–52.0)
Hemoglobin: 11 g/dL — ABNORMAL LOW (ref 13.0–17.0)
LYMPHS PCT: 8 % — AB (ref 12–46)
Lymphs Abs: 1.2 10*3/uL (ref 0.7–4.0)
MCH: 29.3 pg (ref 26.0–34.0)
MCHC: 33.6 g/dL (ref 30.0–36.0)
MCV: 87 fL (ref 78.0–100.0)
MONO ABS: 0.6 10*3/uL (ref 0.1–1.0)
Monocytes Relative: 4 % (ref 3–12)
Neutro Abs: 13.1 10*3/uL — ABNORMAL HIGH (ref 1.7–7.7)
Neutrophils Relative %: 88 % — ABNORMAL HIGH (ref 43–77)
Platelets: 216 10*3/uL (ref 150–400)
RBC: 3.76 MIL/uL — ABNORMAL LOW (ref 4.22–5.81)
RDW: 14.6 % (ref 11.5–15.5)
WBC: 14.9 10*3/uL — ABNORMAL HIGH (ref 4.0–10.5)

## 2013-11-05 LAB — BASIC METABOLIC PANEL
BUN: 29 mg/dL — AB (ref 6–23)
CALCIUM: 9.1 mg/dL (ref 8.4–10.5)
CO2: 20 meq/L (ref 19–32)
CREATININE: 1.76 mg/dL — AB (ref 0.50–1.35)
Chloride: 104 mEq/L (ref 96–112)
GFR calc Af Amer: 39 mL/min — ABNORMAL LOW (ref >90)
GFR, EST NON AFRICAN AMERICAN: 33 mL/min — AB (ref >90)
GLUCOSE: 110 mg/dL — AB (ref 70–99)
Potassium: 3.9 mEq/L (ref 3.7–5.3)
Sodium: 140 mEq/L (ref 137–147)

## 2013-11-05 LAB — URINE MICROSCOPIC-ADD ON

## 2013-11-05 MED ORDER — SODIUM CHLORIDE 0.9 % IV BOLUS (SEPSIS)
500.0000 mL | Freq: Once | INTRAVENOUS | Status: AC
  Administered 2013-11-05: 500 mL via INTRAVENOUS

## 2013-11-05 NOTE — ED Notes (Signed)
A urinal has been placed at the bedside. Pt and family at bedside aware of the urinalysis order.

## 2013-11-05 NOTE — ED Notes (Signed)
Pt states he is having pain in his left flank area  Pt states the pain started about 4pm today  Pt states about 25 days ago he was in [place] and was in the hospital for 4 days with pneumonia that was on the left and pt states he has a hx of kidney stones  Pt is unsure which this is  Pt is due for a chest xray for follow up tomorrow

## 2013-11-05 NOTE — ED Provider Notes (Signed)
CSN: [id number]     Arrival date & time 11/05/13  1839 History   First MD Initiated Contact with Patient 11/05/13 2035     Chief Complaint  Patient presents with  . Flank Pain     (Consider location/radiation/quality/duration/timing/severity/associated sxs/prior Treatment) The history is provided by the patient and medical records.   This is an 80 year old male with past medical history significant for hyperlipidemia, chronic kidney disease, AAA status post graft repair, kidney stones, presenting to the ED for left flank pain. Patient states pain started around 1600 today, initially starting in left flank but now more localized to LLQ.  Pain described as sharp and stabbing in nature. Daughter reports he was hospitalized within the past month for a bowel of left-sided posterior pneumonia he presented with similar symptoms. He denies any recent cough, fever, shortness of breath, or chest pain.  Daughter remains concern for recurrent pneumonia as he had no typical symptoms with his initial presentation. He has been having normal urinary output without any dysuria or hematuria. No nausea or vomiting.  BM normal, no melena or hematochezia.  VS stable on arrival.  Past Medical History  Diagnosis Date  . Ulcerative colitis     Takes mesalamine  . Hyperlipidemia   . Chronic kidney disease, stage 3   . Esophageal reflux   . ED (erectile dysfunction)   . AAA (abdominal aortic aneurysm) without rupture     Abdominal ultrasound showed an increase from 4.1 to 5.3 cm diameter AAA.  Marland Kitchen Borderline hypertension   . AAA (abdominal aortic aneurysm)    Past Surgical History  Procedure Laterality Date  . Kidney stone surgery  1970s  . Tonsillectomy    . Mandible fracture surgery  college  . Abdominal aortic endovascular stent graft N/A 12/16/2012    Procedure: ABDOMINAL AORTIC ENDOVASCULAR STENT GRAFT-GORE;  Surgeon: Serafina Mitchell, MD;  Location: [facility];  Service: Vascular;  Laterality: N/A;  Ultrasound  guided   Family History  Problem Relation Age of Onset  . Cancer Mother    History  Substance Use Topics  . Smoking status: Former Smoker    Types: Cigarettes, Cigars    Quit date: 11/01/1973  . Smokeless tobacco: Never Used  . Alcohol Use: 1.0 - 1.5 oz/week    2-3 drink(s) per week     Comment: occasionally, but not every week    Review of Systems  Genitourinary: Positive for flank pain.  All other systems reviewed and are negative.     Allergies  Review of patient's allergies indicates no known allergies.  Home Medications   Prior to Admission medications   Medication Sig Start Date End Date Taking? Authorizing Provider  mesalamine (LIALDA) 1.2 G EC tablet Take 2.4 g by mouth daily with breakfast.    Yes Historical Provider, MD   BP 184/60  Pulse 53  Temp(Src) 97.7 F (36.5 C)  Resp 18  Wt 158 lb (71.668 kg)  SpO2 99%  Physical Exam  Nursing note and vitals reviewed. Constitutional: He is oriented to person, place, and time. He appears well-developed and well-nourished.  HENT:  Head: Normocephalic and atraumatic.  Mouth/Throat: Oropharynx is clear and moist.  Eyes: Conjunctivae and EOM are normal. Pupils are equal, round, and reactive to light.  Neck: Normal range of motion.  Cardiovascular: Normal rate, regular rhythm and normal heart sounds.   Pulmonary/Chest: Effort normal and breath sounds normal. No respiratory distress. He has no wheezes.  Abdominal: Soft. Bowel sounds are normal. There is  tenderness in the left lower quadrant. There is CVA tenderness (left, mild).    Musculoskeletal: Normal range of motion.  Neurological: He is alert and oriented to person, place, and time.  Skin: Skin is warm and dry.  Psychiatric: He has a normal mood and affect.    ED Course  Procedures (including critical care time) Labs Review Labs Reviewed  CBC WITH DIFFERENTIAL - Abnormal; Notable for the following:    WBC 14.9 (*)    RBC 3.76 (*)    Hemoglobin 11.0  (*)    HCT 32.7 (*)    Neutrophils Relative % 88 (*)    Neutro Abs 13.1 (*)    Lymphocytes Relative 8 (*)    All other components within normal limits  BASIC METABOLIC PANEL - Abnormal; Notable for the following:    Glucose, Bld 110 (*)    BUN 29 (*)    Creatinine, Ser 1.76 (*)    GFR calc non Af Amer 33 (*)    GFR calc Af Amer 39 (*)    All other components within normal limits  I-STAT CHEM 8, ED - Abnormal; Notable for the following:    BUN 30 (*)    Creatinine, Ser 2.10 (*)    Glucose, Bld 109 (*)    Hemoglobin 11.6 (*)    HCT 34.0 (*)    All other components within normal limits  URINALYSIS, ROUTINE W REFLEX MICROSCOPIC  I-STAT TROPOININ, ED    Imaging Review Ct Abdomen Pelvis Wo Contrast  11/05/2013   CLINICAL DATA:  Left flank and pelvic pain.  EXAM: CT ABDOMEN AND PELVIS WITHOUT CONTRAST  TECHNIQUE: Multidetector CT imaging of the abdomen and pelvis was performed following the standard protocol without IV contrast.  COMPARISON:  05/29/2013  FINDINGS: No pleural effusions identified. Diffuse tree-in-bud nodularity in both lower lobes is again identified. There is no pericardial effusion. Calcified atherosclerotic disease is noted involving RCA Coronary artery. The patient has a moderate size hiatal hernia.  No focal liver abnormality identified. There are 2 stones within the gallbladder measuring up to 6 mm. No biliary dilatation. Normal appearance of the pancreas. The spleen is on unremarkable.  Normal appearance of the right adrenal gland. There is a tiny stone within the inferior pole the right kidney measuring 2-3 mm. No right hydronephrosis or hydroureter identified. No ureteral lithiasis. There is asymmetric left-sided nephromegaly, hydronephrosis and perinephric fat stranding. Within the mid left ureter there is a stone measuring 3 mm, image 46/series 2. Urinary bladder appears normal. There is prostate gland enlargement.  The patient is status post stent graft repair of  abdominal aortic aneurysm. The aneurysm sac measure 4.6 cm in AP dimension which is unchanged from previous exam. No upper abdominal adenopathy identified. There is no pelvic or inguinal adenopathy noted.  No free fluid or fluid collections identified.  The small bowel loops have a normal course and caliber. No obstruction. Normal appearance of the colon  Review of the visualized bony structures is significant for moderate multi level lumbar spondylosis.  IMPRESSION: 1. Left-sided hydronephrosis and hydroureter secondary to 2 mm mid left ureteral calculus. 2. Status post stent graft repair of abdominal aortic aneurysm. This is unchanged in AP dimension compared with previous exam. 3. Hiatal hernia 4. Scattered tree-in-bud nodules in the lung bases are likely the sequelae of chronic inflammation or infection.   Electronically Signed   By: Kerby Moors M.D.   On: 11/05/2013 23:38   Dg Chest 2 View  11/05/2013  CLINICAL DATA:  Chest pain.  EXAM: CHEST  2 VIEW  COMPARISON:  Chest CT 05/29/2013.  Chest radiograph 12/16/2012.  FINDINGS: Cardiac silhouette is upper limits of normal in size to mildly enlarged. The lungs are better inflated than on the prior radiograph. There is minimal linear opacity in the lung bases posteriorly. There is no evidence of segmental airspace opacity, overt pulmonary edema, pleural effusion, or pneumothorax. No acute osseous abnormality is identified. Abdominal aortic stent graft is partially visualized.  IMPRESSION: Minimal streaky bibasilar opacity, likely atelectasis.   Electronically Signed   By: Logan Bores   On: 11/05/2013 20:55     EKG Interpretation None      MDM   Final diagnoses:  Left ureteral calculus  Hematuria   80 year old male with left flank pain onset this afternoon. Recent hospitalization for left sided pneumonia and patient has history of kidney stones.  On exam, patient is afebrile there are nontoxic-appearing. He has mild pain of his left lateral  abdomen and left lower quadrant.  He does appear clinically dry.  Will plan for basic labs, chest x-ray, u/a, +/- CT w/o contrast.  Small fluid bolus given.  VS stable at this time.  Labs with leukocytosis of 14.9.  Serum creatinine is slightly increased when compared with previous.  UA with noted hematuria. Chest x-ray is clear. CT revealing left-sided hydronephrosis and hydroureter secondary to millimeter left ureteral calculus. AAA graft repair appears stable.  Patient's pain is well controlled in the emergency department and VS remain stable. He will be discharged home with urine strainer, Percocet, Zofran, and Flomax. He will followup with urology.  Discussed plan with patient, he/she acknowledged understanding and agreed with plan of care.  Return precautions given for new or worsening symptoms.  Discussed with Dr. Venora Maples who personally evaluated pt and agrees with plan of care.  Larene Pickett, PA-C 11/06/13 0036

## 2013-11-06 DIAGNOSIS — Z0000 Encounter for general adult medical examination without abnormal findings: Secondary | ICD-10-CM | POA: Diagnosis not present

## 2013-11-06 DIAGNOSIS — Z23 Encounter for immunization: Secondary | ICD-10-CM | POA: Diagnosis not present

## 2013-11-06 DIAGNOSIS — E291 Testicular hypofunction: Secondary | ICD-10-CM | POA: Diagnosis not present

## 2013-11-06 DIAGNOSIS — Z1331 Encounter for screening for depression: Secondary | ICD-10-CM | POA: Diagnosis not present

## 2013-11-06 DIAGNOSIS — Z125 Encounter for screening for malignant neoplasm of prostate: Secondary | ICD-10-CM | POA: Diagnosis not present

## 2013-11-06 DIAGNOSIS — E785 Hyperlipidemia, unspecified: Secondary | ICD-10-CM | POA: Diagnosis not present

## 2013-11-06 MED ORDER — ONDANSETRON HCL 4 MG PO TABS: 4.0000 mg | ORAL_TABLET | Freq: Four times a day (QID) | ORAL | Status: DC

## 2013-11-06 MED ORDER — OXYCODONE-ACETAMINOPHEN 5-325 MG PO TABS: 1.0000 | ORAL_TABLET | ORAL | Status: DC | PRN

## 2013-11-06 MED ORDER — TAMSULOSIN HCL 0.4 MG PO CAPS: 0.4000 mg | ORAL_CAPSULE | Freq: Every day | ORAL | Status: DC

## 2013-11-06 MED ORDER — OXYCODONE-ACETAMINOPHEN 5-325 MG PO TABS
1.0000 | ORAL_TABLET | Freq: Once | ORAL | Status: AC
  Administered 2013-11-06: 1 via ORAL
  Filled 2013-11-06: qty 1

## 2013-11-06 NOTE — ED Provider Notes (Signed)
Medical screening examination/treatment/procedure(s) were conducted as a shared visit with non-physician practitioner(s) and myself.  I personally evaluated the patient during the encounter.   EKG Interpretation None      Pain control.  Small distal ureteral stone.  Outpatient neurology followup.  Overall well-appearing  Ct Abdomen Pelvis Wo Contrast  11/05/2013   CLINICAL DATA:  Left flank and pelvic pain.  EXAM: CT ABDOMEN AND PELVIS WITHOUT CONTRAST  TECHNIQUE: Multidetector CT imaging of the abdomen and pelvis was performed following the standard protocol without IV contrast.  COMPARISON:  05/29/2013  FINDINGS: No pleural effusions identified. Diffuse tree-in-bud nodularity in both lower lobes is again identified. There is no pericardial effusion. Calcified atherosclerotic disease is noted involving RCA Coronary artery. The patient has a moderate size hiatal hernia.  No focal liver abnormality identified. There are 2 stones within the gallbladder measuring up to 6 mm. No biliary dilatation. Normal appearance of the pancreas. The spleen is on unremarkable.  Normal appearance of the right adrenal gland. There is a tiny stone within the inferior pole the right kidney measuring 2-3 mm. No right hydronephrosis or hydroureter identified. No ureteral lithiasis. There is asymmetric left-sided nephromegaly, hydronephrosis and perinephric fat stranding. Within the mid left ureter there is a stone measuring 3 mm, image 46/series 2. Urinary bladder appears normal. There is prostate gland enlargement.  The patient is status post stent graft repair of abdominal aortic aneurysm. The aneurysm sac measure 4.6 cm in AP dimension which is unchanged from previous exam. No upper abdominal adenopathy identified. There is no pelvic or inguinal adenopathy noted.  No free fluid or fluid collections identified.  The small bowel loops have a normal course and caliber. No obstruction. Normal appearance of the colon  Review of the  visualized bony structures is significant for moderate multi level lumbar spondylosis.  IMPRESSION: 1. Left-sided hydronephrosis and hydroureter secondary to 2 mm mid left ureteral calculus. 2. Status post stent graft repair of abdominal aortic aneurysm. This is unchanged in AP dimension compared with previous exam. 3. Hiatal hernia 4. Scattered tree-in-bud nodules in the lung bases are likely the sequelae of chronic inflammation or infection.   Electronically Signed   By: Kerby Moors M.D.   On: 11/05/2013 23:38   Dg Chest 2 View  11/05/2013   CLINICAL DATA:  Chest pain.  EXAM: CHEST  2 VIEW  COMPARISON:  Chest CT 05/29/2013.  Chest radiograph 12/16/2012.  FINDINGS: Cardiac silhouette is upper limits of normal in size to mildly enlarged. The lungs are better inflated than on the prior radiograph. There is minimal linear opacity in the lung bases posteriorly. There is no evidence of segmental airspace opacity, overt pulmonary edema, pleural effusion, or pneumothorax. No acute osseous abnormality is identified. Abdominal aortic stent graft is partially visualized.  IMPRESSION: Minimal streaky bibasilar opacity, likely atelectasis.   Electronically Signed   By: Logan Bores   On: 11/05/2013 20:55     Hoy Morn, MD 11/06/13 [phone]

## 2013-11-06 NOTE — Discharge Instructions (Signed)
Take the prescribed medication as directed for symptom control. Follow-up with [facility]-- call and schedule appt. Return to the ED for new or worsening symptoms.

## 2013-11-13 DIAGNOSIS — E785 Hyperlipidemia, unspecified: Secondary | ICD-10-CM | POA: Diagnosis not present

## 2013-11-13 DIAGNOSIS — I714 Abdominal aortic aneurysm, without rupture, unspecified: Secondary | ICD-10-CM | POA: Diagnosis not present

## 2013-11-13 DIAGNOSIS — R030 Elevated blood-pressure reading, without diagnosis of hypertension: Secondary | ICD-10-CM | POA: Diagnosis not present

## 2013-11-13 DIAGNOSIS — N183 Chronic kidney disease, stage 3 unspecified: Secondary | ICD-10-CM | POA: Diagnosis not present

## 2013-11-20 ENCOUNTER — Ambulatory Visit: Payer: Medicare Other | Admitting: Surgery

## 2013-11-21 ENCOUNTER — Other Ambulatory Visit: Payer: Self-pay | Admitting: Internal Medicine

## 2013-11-21 DIAGNOSIS — R1310 Dysphagia, unspecified: Secondary | ICD-10-CM | POA: Diagnosis not present

## 2013-11-24 DIAGNOSIS — N200 Calculus of kidney: Secondary | ICD-10-CM | POA: Diagnosis not present

## 2013-11-24 DIAGNOSIS — N201 Calculus of ureter: Secondary | ICD-10-CM | POA: Diagnosis not present

## 2013-11-28 ENCOUNTER — Ambulatory Visit
Admission: RE | Admit: 2013-11-28 | Discharge: 2013-11-28 | Disposition: A | Discharge status: Home / Self Care | Payer: Medicare Other | Source: Ambulatory Visit | Attending: Internal Medicine | Admitting: Internal Medicine

## 2013-11-28 DIAGNOSIS — R1310 Dysphagia, unspecified: Secondary | ICD-10-CM

## 2013-11-28 DIAGNOSIS — K449 Diaphragmatic hernia without obstruction or gangrene: Secondary | ICD-10-CM | POA: Diagnosis not present

## 2013-12-07 DIAGNOSIS — K222 Esophageal obstruction: Secondary | ICD-10-CM | POA: Diagnosis not present

## 2013-12-07 DIAGNOSIS — R1310 Dysphagia, unspecified: Secondary | ICD-10-CM | POA: Diagnosis not present

## 2013-12-14 DIAGNOSIS — N179 Acute kidney failure, unspecified: Secondary | ICD-10-CM | POA: Diagnosis not present

## 2013-12-25 ENCOUNTER — Other Ambulatory Visit: Payer: Self-pay | Admitting: Gastroenterology

## 2013-12-25 NOTE — Addendum Note (Signed)
Addended by: Vernie Ammons. on: 12/25/2013 04:46 PM   Modules accepted: Orders

## 2013-12-26 ENCOUNTER — Encounter (HOSPITAL_COMMUNITY): Payer: Self-pay | Admitting: *Deleted

## 2013-12-26 ENCOUNTER — Encounter (HOSPITAL_COMMUNITY)
Admission: RE | Disposition: A | Discharge status: Home / Self Care | Payer: Self-pay | Source: Ambulatory Visit | Attending: Gastroenterology

## 2013-12-26 ENCOUNTER — Ambulatory Visit (HOSPITAL_COMMUNITY)
Admission: RE | Admit: 2013-12-26 | Discharge: 2013-12-26 | Disposition: A | Discharge status: Home / Self Care | Payer: Medicare Other | Source: Ambulatory Visit | Attending: Gastroenterology | Admitting: Gastroenterology

## 2013-12-26 ENCOUNTER — Ambulatory Visit (HOSPITAL_COMMUNITY): Payer: Medicare Other

## 2013-12-26 DIAGNOSIS — N183 Chronic kidney disease, stage 3 unspecified: Secondary | ICD-10-CM | POA: Diagnosis not present

## 2013-12-26 DIAGNOSIS — Z79899 Other long term (current) drug therapy: Secondary | ICD-10-CM | POA: Diagnosis not present

## 2013-12-26 DIAGNOSIS — E785 Hyperlipidemia, unspecified: Secondary | ICD-10-CM | POA: Diagnosis not present

## 2013-12-26 DIAGNOSIS — Z87891 Personal history of nicotine dependence: Secondary | ICD-10-CM | POA: Diagnosis not present

## 2013-12-26 DIAGNOSIS — K222 Esophageal obstruction: Secondary | ICD-10-CM | POA: Diagnosis not present

## 2013-12-26 DIAGNOSIS — R1310 Dysphagia, unspecified: Secondary | ICD-10-CM | POA: Diagnosis present

## 2013-12-26 DIAGNOSIS — I129 Hypertensive chronic kidney disease with stage 1 through stage 4 chronic kidney disease, or unspecified chronic kidney disease: Secondary | ICD-10-CM | POA: Diagnosis not present

## 2013-12-26 HISTORY — PX: SAVORY DILATION: SHX5439

## 2013-12-26 HISTORY — PX: ESOPHAGOGASTRODUODENOSCOPY: SHX5428

## 2013-12-26 SURGERY — EGD (ESOPHAGOGASTRODUODENOSCOPY)
Anesthesia: Moderate Sedation

## 2013-12-26 MED ORDER — BUTAMBEN-TETRACAINE-BENZOCAINE 2-2-14 % EX AERO
INHALATION_SPRAY | CUTANEOUS | Status: DC | PRN
  Administered 2013-12-26: 2 via TOPICAL

## 2013-12-26 MED ORDER — SODIUM CHLORIDE 0.9 % IV SOLN: INTRAVENOUS | Status: DC

## 2013-12-26 MED ORDER — DIPHENHYDRAMINE HCL 50 MG/ML IJ SOLN
INTRAMUSCULAR | Status: AC
  Filled 2013-12-26: qty 1

## 2013-12-26 MED ORDER — MIDAZOLAM HCL 10 MG/2ML IJ SOLN
INTRAMUSCULAR | Status: DC | PRN
  Administered 2013-12-26 (×2): 2 mg via INTRAVENOUS

## 2013-12-26 MED ORDER — ATROPINE SULFATE 0.4 MG/ML IJ SOLN
INTRAMUSCULAR | Status: AC
  Filled 2013-12-26: qty 1

## 2013-12-26 MED ORDER — MIDAZOLAM HCL 10 MG/2ML IJ SOLN
INTRAMUSCULAR | Status: AC
  Filled 2013-12-26: qty 2

## 2013-12-26 MED ORDER — FENTANYL CITRATE 0.05 MG/ML IJ SOLN
INTRAMUSCULAR | Status: DC | PRN
Start: 2013-12-26 — End: 2013-12-26
  Administered 2013-12-26 (×2): 25 ug via INTRAVENOUS

## 2013-12-26 MED ORDER — FENTANYL CITRATE 0.05 MG/ML IJ SOLN
INTRAMUSCULAR | Status: AC
  Filled 2013-12-26: qty 2

## 2013-12-26 NOTE — H&P (Signed)
  Subjective:   Patient is a 80 y.o. male presents with dysphagia. He had barium swallow shown hiatal hernia with his stricture in the distal esophagus.Abbe Amsterdam held up in this area. He did have EGD about 8 years ago with no clear stricture in that area. Procedure including risks and benefits discussed in office.  Patient Active Problem List   Diagnosis Date Noted  . AAA (abdominal aortic aneurysm) without rupture 03/13/2013  . Lung mass 03/13/2013  . Back pain 12/23/2012  . Abdominal aneurysm without mention of rupture 11/21/2012  . AAA (abdominal aortic aneurysm) 11/07/2012  . Hypertension 11/07/2012  . PAD (peripheral artery disease) 11/07/2012  . Dyslipidemia, goal LDL below 70 11/07/2012   Past Medical History  Diagnosis Date  . Ulcerative colitis     Takes mesalamine  . Hyperlipidemia   . Chronic kidney disease, stage 3   . Esophageal reflux   . ED (erectile dysfunction)   . AAA (abdominal aortic aneurysm) without rupture     Abdominal ultrasound showed an increase from 4.1 to 5.3 cm diameter AAA.  Marland Kitchen Borderline hypertension   . AAA (abdominal aortic aneurysm)     Past Surgical History  Procedure Laterality Date  . Kidney stone surgery  1970s  . Tonsillectomy    . Mandible fracture surgery  college  . Abdominal aortic endovascular stent graft N/A 12/16/2012    Procedure: ABDOMINAL AORTIC ENDOVASCULAR STENT GRAFT-GORE;  Surgeon: Serafina Mitchell, MD;  Location: [facility];  Service: Vascular;  Laterality: N/A;  Ultrasound guided    Prescriptions prior to admission  Medication Sig Dispense Refill  . mesalamine (LIALDA) 1.2 G EC tablet Take 2.4 g by mouth daily with breakfast.       . ondansetron (ZOFRAN) 4 MG tablet Take 1 tablet (4 mg total) by mouth every 6 (six) hours.  12 tablet  0  . oxyCODONE-acetaminophen (PERCOCET/ROXICET) 5-325 MG per tablet Take 1 tablet by mouth every 4 (four) hours as needed.  15 tablet  0  . tamsulosin (FLOMAX) 0.4 MG CAPS capsule Take 1 capsule (0.4  mg total) by mouth daily after supper.  7 capsule  0   No Known Allergies  History  Substance Use Topics  . Smoking status: Former Smoker    Types: Cigarettes, Cigars    Quit date: 11/01/1973  . Smokeless tobacco: Never Used  . Alcohol Use: 1.0 - 1.5 oz/week    2-3 drink(s) per week     Comment: occasionally, but not every week    Family History  Problem Relation Age of Onset  . Cancer Mother      Objective:   Patient Vitals for the past 8 hrs:  BP Temp Temp src Pulse Resp SpO2 Height Weight  12/26/13 1346 177/57 mmHg 97.7 F (36.5 C) Oral 43 15 99 % 5' 8.5" (1.74 m) 72.576 kg (160 lb)         See MD Preop evaluation      Assessment:   1. Esophageal stricture. Patient needs dilatation. Have discussed this in detail with him in the office. 2. Bradycardia. Heart rate in the 40s. Discuss with Dr. Noah Delaine, patients PCP. He has had bradycardia for some time with heart rate ranging from low 40s to low 50s and is asymptomatic. He felt that he was safe to go ahead with dilatation. The patient remains asymptomatic.  Plan:   Will proceed with EGD and dilatation using fluoroscopic guidance.

## 2013-12-26 NOTE — Discharge Instructions (Addendum)
Continue current meds. Clear liquids for 4-6 hours, if no chest pain or trouble breathing, soft foods tonight.  Regular diet tomorrow. Call for problems.  Call report to Dr Oletta Lamas office in 1 week  Conscious Sedation, Adult, Care After Refer to this sheet in the next few weeks. These instructions provide you with information on caring for yourself after your procedure. Your health care provider may also give you more specific instructions. Your treatment has been planned according to current medical practices, but problems sometimes occur. Call your health care provider if you have any problems or questions after your procedure. WHAT TO EXPECT AFTER THE PROCEDURE  After your procedure:  You may feel sleepy, clumsy, and have poor balance for several hours.  Vomiting may occur if you eat too soon after the procedure. HOME CARE INSTRUCTIONS  Do not participate in any activities where you could become injured for at least 24 hours. Do not:  Drive.  Swim.  Ride a bicycle.  Operate heavy machinery.  Cook.  Use power tools.  Climb ladders.  Work from a high place.  Do not make important decisions or sign legal documents until you are improved.  If you vomit, drink water, juice, or soup when you can drink without vomiting. Make sure you have little or no nausea before eating solid foods.  Only take over-the-counter or prescription medicines for pain, discomfort, or fever as directed by your health care provider.  Make sure you and your family fully understand everything about the medicines given to you, including what side effects may occur.  You should not drink alcohol, take sleeping pills, or take medicines that cause drowsiness for at least 24 hours.  If you smoke, do not smoke without supervision.  If you are feeling better, you may resume normal activities 24 hours after you were sedated.  Keep all appointments with your health care provider. SEEK MEDICAL CARE  IF:  Your skin is pale or bluish in color.  You continue to feel nauseous or vomit.  Your pain is getting worse and is not helped by medicine.  You have bleeding or swelling.  You are still sleepy or feeling clumsy after 24 hours. SEEK IMMEDIATE MEDICAL CARE IF:  You develop a rash.  You have difficulty breathing.  You develop any type of allergic problem.  You have a fever. MAKE SURE YOU:  Understand these instructions.  Will watch your condition.  Will get help right away if you are not doing well or get worse. Document Released: 02/15/2013 Document Reviewed: 02/15/2013 [facility] Patient Information 2015 [place], [place]. This information is not intended to replace advice given to you by your health care provider. Make sure you discuss any questions you have with your health care provider. Esophagogastroduodenoscopy Care After Refer to this sheet in the next few weeks. These instructions provide you with information on caring for yourself after your procedure. Your caregiver may also give you more specific instructions. Your treatment has been planned according to current medical practices, but problems sometimes occur. Call your caregiver if you have any problems or questions after your procedure.  HOME CARE INSTRUCTIONS  Do not eat or drink anything until the numbing medicine (local anesthetic) has worn off and your gag reflex has returned. You will know that the local anesthetic has worn off when you can swallow comfortably.  Do not drive for 12 hours after the procedure or as directed by your caregiver.  Only take medicines as directed by your caregiver. [place]  CARE IF:   You cannot stop coughing.  You are not urinating at all or less than usual. SEEK IMMEDIATE MEDICAL CARE IF:  You have difficulty swallowing.  You cannot eat or drink.  You have worsening throat or chest pain.  You have dizziness, lightheadedness, or you faint.  You have nausea or  vomiting.  You have chills.  You have a fever.  You have severe abdominal pain.  You have black, tarry, or bloody stools. Document Released: 04/13/2012 Document Reviewed: 04/13/2012 [facility] Patient Information 2015 [place]. This information is not intended to replace advice given to you by your health care provider. Make sure you discuss any questions you have with your health care provider.

## 2013-12-26 NOTE — Op Note (Signed)
[facility] [place], [address]   ENDOSCOPY PROCEDURE REPORT  PATIENT: Ford, Bryan  MR#: [id number] BIRTHDATE: 06-03-1925 , 88  yrs. old GENDER: Male ENDOSCOPIST: Laurence Spates, MD ASSISTANT:   Cherylynn Ridges, technician Elna Breslow, RN REFERRED BY:   Dr Noah Delaine PROCEDURE DATE:  12/26/2013 PROCEDURE:        EGD and Savary dilatation under fluoroscopic guidance ASA CLASS:      ASA 2 INDICATIONS:    dysphagia and esophageal stricture demonstrated on barium swallow MEDICATIONS:       fentanyl 50 mcg, versed 5 mg IV TOPICAL ANESTHETIC:      cetacaine spray  DESCRIPTION OF PROCEDURE:   After the risks benefits and alternatives of the procedure were thoroughly explained, informed consent was obtained.  The [id number]  endoscope was introduced through the mouth  and advanced to the duodenal bulb without problems. ,      The instrument was slowly withdrawn as the mucosa was carefully examind. the duodenum and stomach were carefully examined and were normal.  guidewire place to the scope endoscope withdrawn using fluoroscopic guidance. With the patient's neck extended we passed 12.8 and 14 mm dilators. There was some resistance with a 14 mm dilator but no blood. Unfortunately, the patient moved in the guidewire was pulled back out. Since had been resistance with the 14 mm dilator we elected not to try to dilate further. He tolerated procedure well and there were no immediate complications.    Dilation was then performed at the    12.8 and 14 mm dilators past. 48 seconds of fluro time was used    COMPLICATIONS: There were no complications. ENDOSCOPIC IMPRESSION: 1. Esophageal Stricture. Dilated 14 mm without obvious complication  RECOMMENDATIONS: routine post dilation waters. Will repeat as needed and if repeat needed will need propofol sedation.   eSigned:  Laurence Spates, MD 12/26/2013 2:57 PM  CC: CC: Dr. Thressa Sheller

## 2013-12-27 ENCOUNTER — Encounter (HOSPITAL_COMMUNITY): Payer: Self-pay | Admitting: Gastroenterology

## 2014-01-26 DIAGNOSIS — H9210 Otorrhea, unspecified ear: Secondary | ICD-10-CM | POA: Diagnosis not present

## 2014-01-26 DIAGNOSIS — H908 Mixed conductive and sensorineural hearing loss, unspecified: Secondary | ICD-10-CM | POA: Diagnosis not present

## 2014-01-26 DIAGNOSIS — H6980 Other specified disorders of Eustachian tube, unspecified ear: Secondary | ICD-10-CM | POA: Diagnosis not present

## 2014-02-09 DIAGNOSIS — Z23 Encounter for immunization: Secondary | ICD-10-CM | POA: Diagnosis not present

## 2014-03-01 DIAGNOSIS — Z86010 Personal history of colonic polyps: Secondary | ICD-10-CM | POA: Diagnosis not present

## 2014-03-07 DIAGNOSIS — H9210 Otorrhea, unspecified ear: Secondary | ICD-10-CM | POA: Diagnosis not present

## 2014-03-07 DIAGNOSIS — H6980 Other specified disorders of Eustachian tube, unspecified ear: Secondary | ICD-10-CM | POA: Diagnosis not present

## 2014-03-07 DIAGNOSIS — H9071 Mixed conductive and sensorineural hearing loss, unilateral, right ear, with unrestricted hearing on the contralateral side: Secondary | ICD-10-CM | POA: Diagnosis not present

## 2014-03-15 ENCOUNTER — Other Ambulatory Visit: Payer: Self-pay | Admitting: Surgery

## 2014-03-15 DIAGNOSIS — H9071 Mixed conductive and sensorineural hearing loss, unilateral, right ear, with unrestricted hearing on the contralateral side: Secondary | ICD-10-CM | POA: Diagnosis not present

## 2014-03-15 DIAGNOSIS — I714 Abdominal aortic aneurysm, without rupture: Secondary | ICD-10-CM | POA: Diagnosis not present

## 2014-03-15 DIAGNOSIS — H903 Sensorineural hearing loss, bilateral: Secondary | ICD-10-CM | POA: Diagnosis not present

## 2014-03-15 LAB — CREATININE, SERUM: CREATININE: 1.39 mg/dL — AB (ref 0.50–1.35)

## 2014-03-15 LAB — BUN: BUN: 21 mg/dL (ref 6–23)

## 2014-03-16 ENCOUNTER — Encounter: Payer: Self-pay | Admitting: Surgery

## 2014-03-19 ENCOUNTER — Ambulatory Visit
Admission: RE | Admit: 2014-03-19 | Discharge: 2014-03-19 | Disposition: A | Discharge status: Home / Self Care | Payer: Medicare Other | Source: Ambulatory Visit | Attending: Surgery | Admitting: Surgery

## 2014-03-19 ENCOUNTER — Encounter: Payer: Self-pay | Admitting: Surgery

## 2014-03-19 ENCOUNTER — Ambulatory Visit (INDEPENDENT_AMBULATORY_CARE_PROVIDER_SITE_OTHER): Payer: Medicare Other | Admitting: Surgery

## 2014-03-19 VITALS — BP 172/65 | HR 48 | Resp 18 | Ht 66.5 in | Wt 174.5 lb

## 2014-03-19 DIAGNOSIS — I714 Abdominal aortic aneurysm, without rupture, unspecified: Secondary | ICD-10-CM

## 2014-03-19 DIAGNOSIS — Z95828 Presence of other vascular implants and grafts: Secondary | ICD-10-CM | POA: Diagnosis not present

## 2014-03-19 MED ORDER — IOHEXOL 350 MG/ML SOLN
80.0000 mL | Freq: Once | INTRAVENOUS | Status: AC | PRN
  Administered 2014-03-19: 80 mL via INTRAVENOUS

## 2014-03-19 NOTE — Addendum Note (Signed)
Addended by: Mena Goes on: 03/19/2014 03:10 PM   Modules accepted: Orders

## 2014-03-19 NOTE — Progress Notes (Signed)
    Established EVAR  History of Present Illness  Bryan Ford is a 80 y.o. (25-May-1925) male who presents for routine follow up s/p EVAR   HISTORY OF PRESENT ILLNESS: Patient is back today for a 6 month  followup. On 12/16/2012, he underwent percutaneous endovascular aneurysm repair using a Gore Excluder device. We have been following a subtle type II endoleak. He reports no complaints today. He does not have back pain or abdominal pain.    The patient's PMH, PSH, SH, FamHx, Med, and Allergies are unchanged from 09/11/2013 .  On ROS today: No fever, no chills, no abdominal no N/V/D.  Other wise negative.  Physical Examination  Filed Vitals:   03/19/14 1259  BP: 172/65  Pulse: 48  Resp: 18  Height: 5' 6.5" (1.689 m)  Weight: 174 lb 8 oz (79.153 kg)   Body mass index is 27.75 kg/(m^2).  General: A&O x 3  Pulmonary: Sym exp, good air movt, CTAB   Cardiac: RRR  Vascular: Vessel Right Left  Radial Palpable Palpable  Brachial Palpable Palpable  Carotid Palpable, without bruit Palpable, without bruit  Aorta Not palpable N/A  Femoral Palpable Palpable  Popliteal Not palpable Not palpable  PT Palpable Palpable  DP Palpable Palpable   Gastrointestinal: soft, NTND  Musculoskeletal: M/S 5/5 throughout    Non-Invasive Vascular Imaging   CTA Abd/Pelvis Duplex (Date: 03/19/2014)  AAA sac size: 4.9 cm  Type II endoleak detected   Medical Decision Making  Bryan Ford is a 80 y.o. male who presents s/p EVAR.   There is no change in the aneurysm since last visit.  I reviewed the CT scan with Dr. Trula Slade today and he agrees.  He will f/u in 6 months for an EVAR AAA duplex scan.     [facility] Office: [phone]   03/19/2014, 1:32 PM   Patient is status post endovascular aneurysm repair on 12/16/2012.  He has been followed for a subtle type II endoleak from a right lumbar.  His CT scan today shows decreased and opacification  of the endoleak with a slightly smaller aneurysm sac with maximum diameter of approximate 4.8 cm.  The patient will follow-up in 6 months with an ultrasound  Bryan Ford

## 2014-04-03 DIAGNOSIS — H9210 Otorrhea, unspecified ear: Secondary | ICD-10-CM | POA: Diagnosis not present

## 2014-04-03 DIAGNOSIS — H9071 Mixed conductive and sensorineural hearing loss, unilateral, right ear, with unrestricted hearing on the contralateral side: Secondary | ICD-10-CM | POA: Diagnosis not present

## 2014-04-03 DIAGNOSIS — H6981 Other specified disorders of Eustachian tube, right ear: Secondary | ICD-10-CM | POA: Diagnosis not present

## 2014-05-31 DIAGNOSIS — Z86010 Personal history of colonic polyps: Secondary | ICD-10-CM | POA: Diagnosis not present

## 2014-05-31 DIAGNOSIS — K219 Gastro-esophageal reflux disease without esophagitis: Secondary | ICD-10-CM | POA: Diagnosis not present

## 2014-05-31 DIAGNOSIS — K5150 Left sided colitis without complications: Secondary | ICD-10-CM | POA: Diagnosis not present

## 2014-05-31 DIAGNOSIS — R1310 Dysphagia, unspecified: Secondary | ICD-10-CM | POA: Diagnosis not present

## 2014-06-13 DIAGNOSIS — H9211 Otorrhea, right ear: Secondary | ICD-10-CM | POA: Diagnosis not present

## 2014-06-13 DIAGNOSIS — H6983 Other specified disorders of Eustachian tube, bilateral: Secondary | ICD-10-CM | POA: Diagnosis not present

## 2014-06-13 DIAGNOSIS — H9071 Mixed conductive and sensorineural hearing loss, unilateral, right ear, with unrestricted hearing on the contralateral side: Secondary | ICD-10-CM | POA: Diagnosis not present

## 2014-09-19 ENCOUNTER — Encounter: Payer: Self-pay | Admitting: Cardiology

## 2014-09-24 ENCOUNTER — Other Ambulatory Visit (HOSPITAL_COMMUNITY): Payer: Medicare Other

## 2014-09-24 ENCOUNTER — Ambulatory Visit: Payer: Medicare Other | Admitting: Surgery

## 2014-09-28 ENCOUNTER — Encounter: Payer: Self-pay | Admitting: Surgery

## 2014-10-01 ENCOUNTER — Ambulatory Visit: Payer: Medicare Other | Admitting: Surgery

## 2014-10-01 ENCOUNTER — Other Ambulatory Visit (HOSPITAL_COMMUNITY): Payer: Medicare Other

## 2014-10-04 IMAGING — CR DG CHEST 2V
2 series · 2 of 2 positions shown · non-contrast
Comparison: Chest CT 05/29/2013.  Chest radiograph 12/16/2012.

CLINICAL DATA: Chest pain.

EXAM:
CHEST  2 VIEW

[w chest pa]
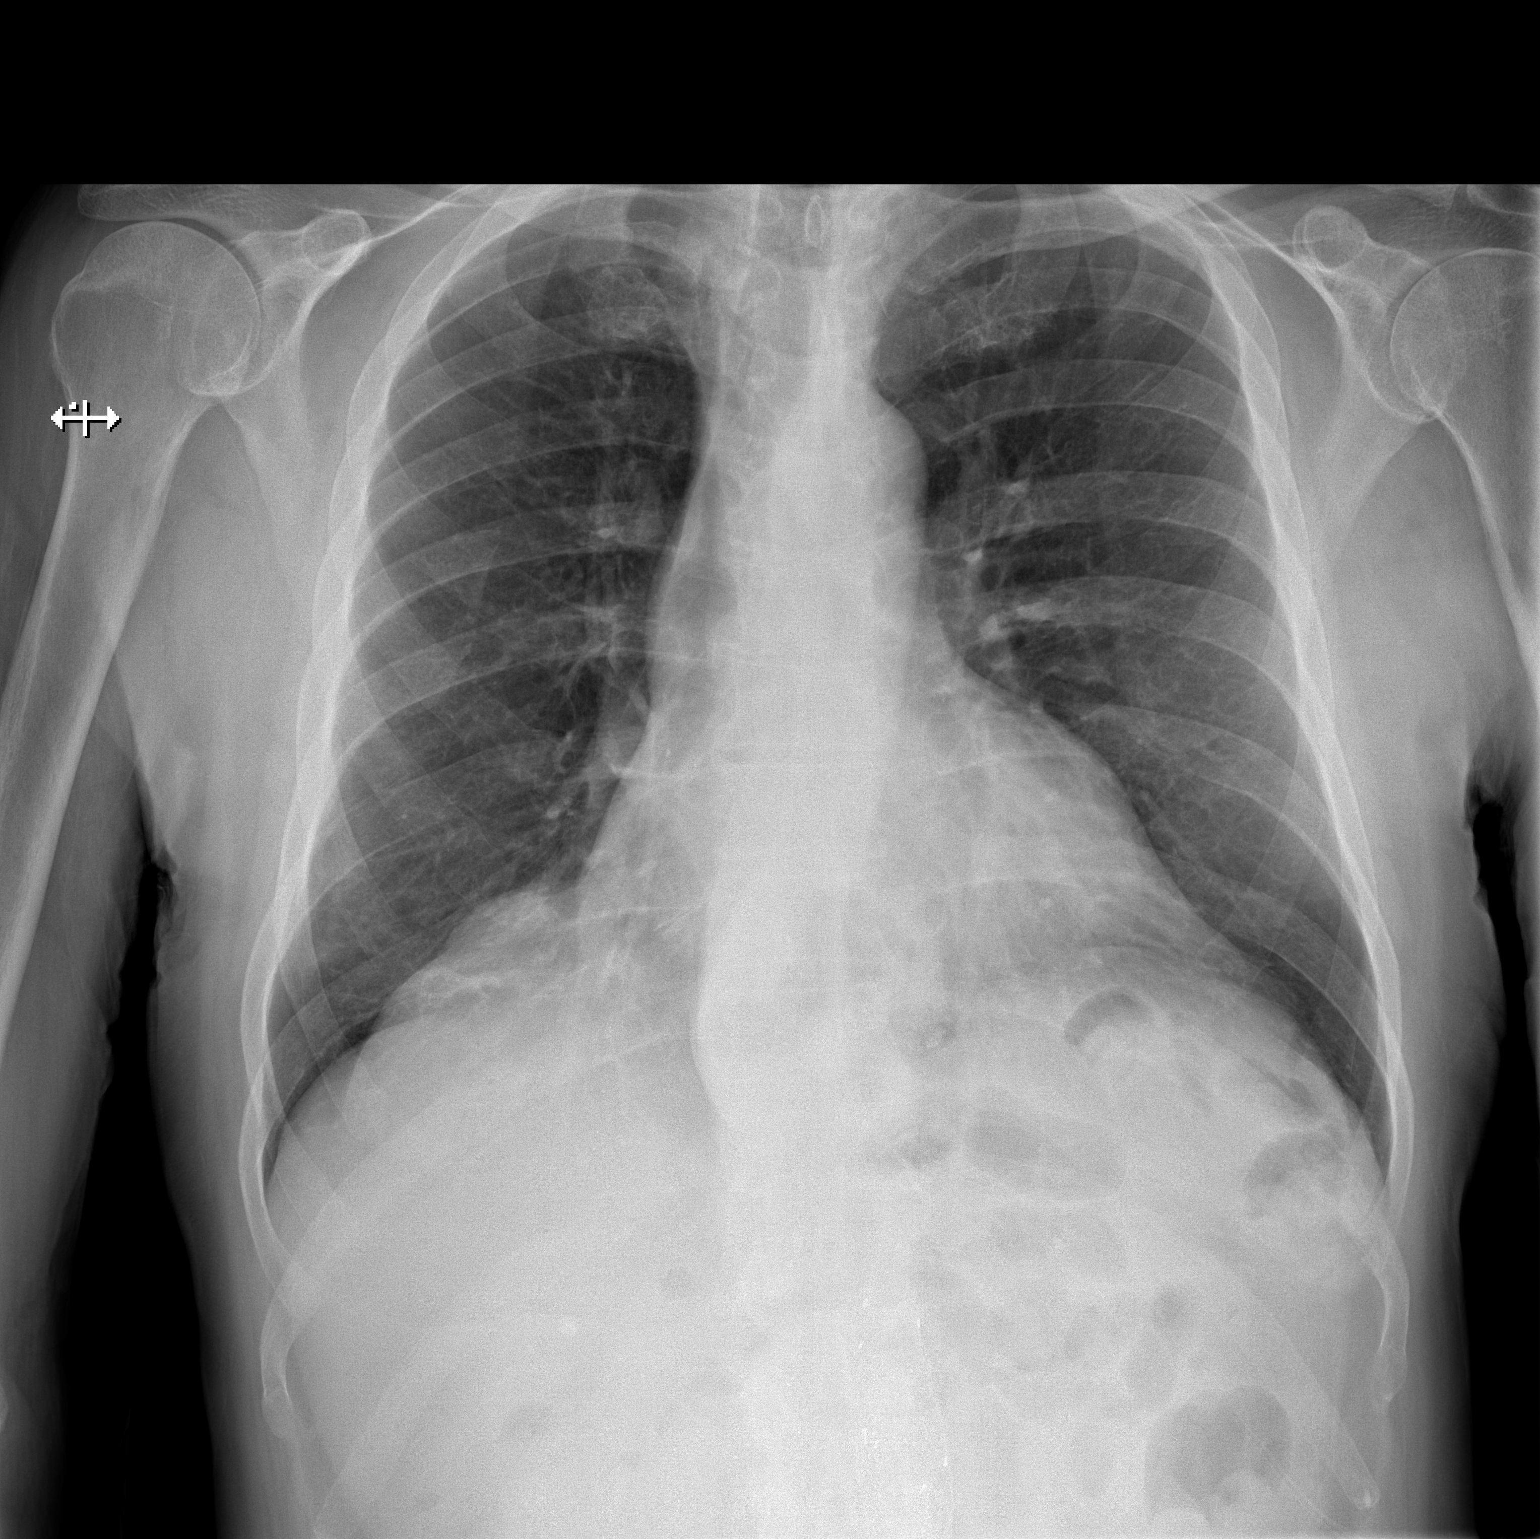

[w chest lat]
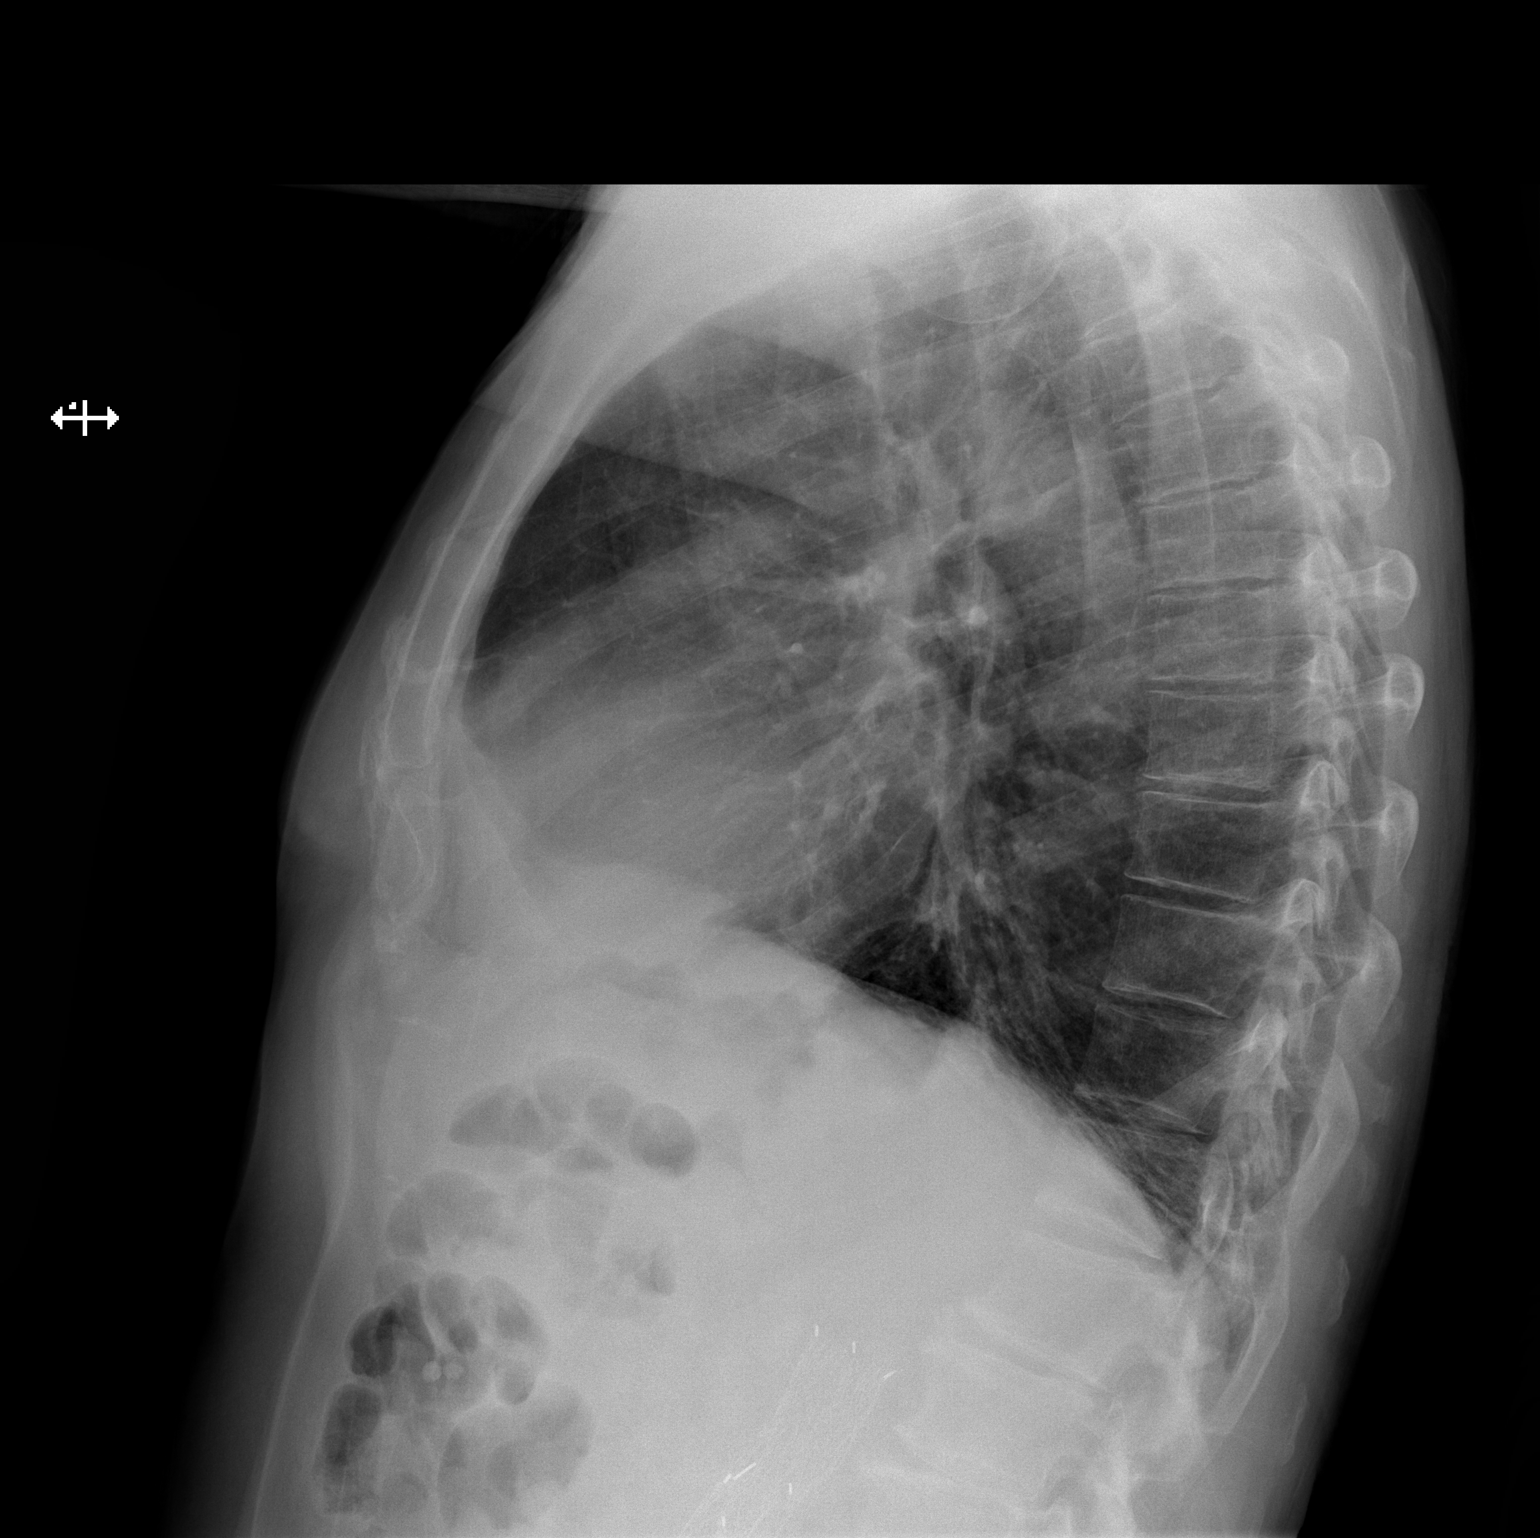

[2 of 2 positions shown; findings below may reference images not displayed]

FINDINGS: Cardiac silhouette is upper limits of normal in size to mildly
enlarged. The lungs are better inflated than on the prior
radiograph. There is minimal linear opacity in the lung bases
posteriorly. There is no evidence of segmental airspace opacity,
overt pulmonary edema, pleural effusion, or pneumothorax. No acute
osseous abnormality is identified. Abdominal aortic stent graft is
partially visualized.
IMPRESSION: Minimal streaky bibasilar opacity, likely atelectasis.

## 2014-10-04 IMAGING — CT CT ABD-PELV W/O CM
1 series · 15 of 24 positions shown, 19 images · non-contrast
Comparison: 05/29/2013

CLINICAL DATA: Left flank and pelvic pain.

EXAM:
CT ABDOMEN AND PELVIS WITHOUT CONTRAST
TECHNIQUE: Multidetector CT imaging of the abdomen and pelvis was performed
following the standard protocol without IV contrast.

[Series 6: lung · axial · 0.74mm/px · z∈[+1026,+1131]mm · 15 of 24 slices shown, 19 images]
[im 2/24  soft-tissue]
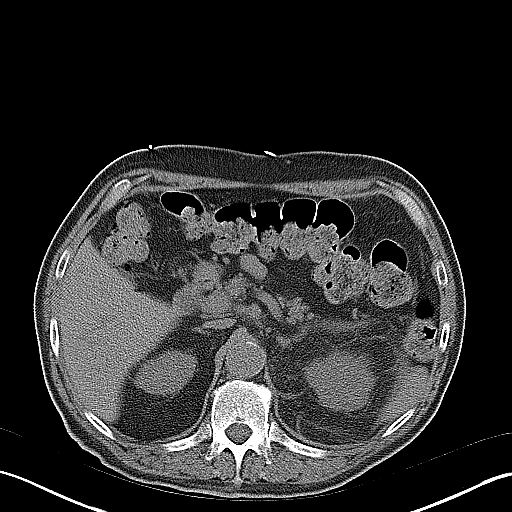
[im 2/24  bone]
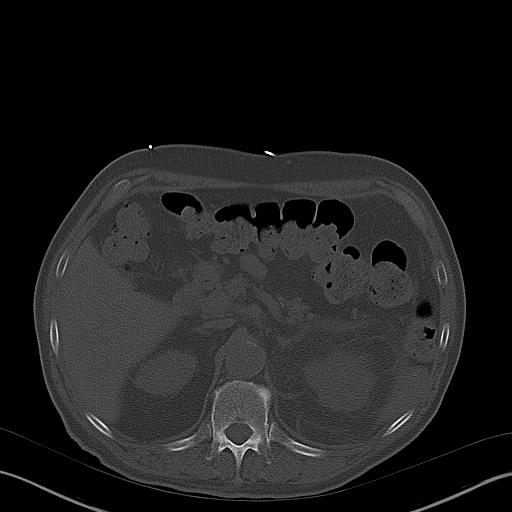
[im 4/24  soft-tissue]
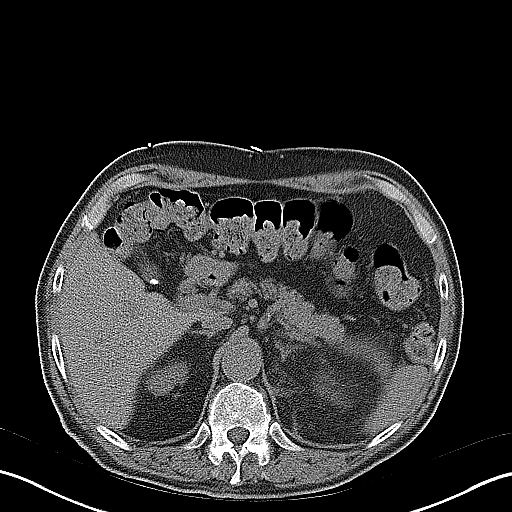
[im 6/24  soft-tissue]
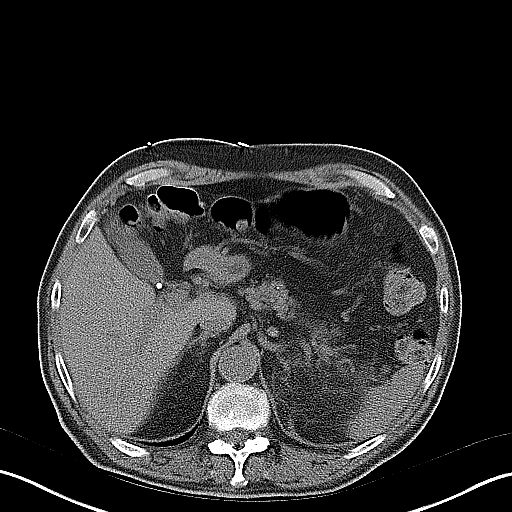
[im 7/24  soft-tissue]
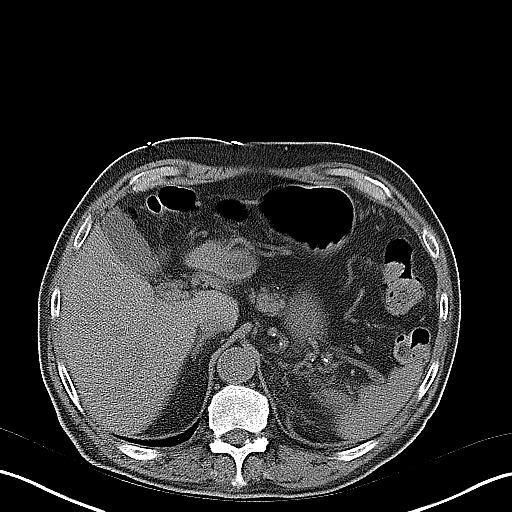
[im 9/24  soft-tissue]
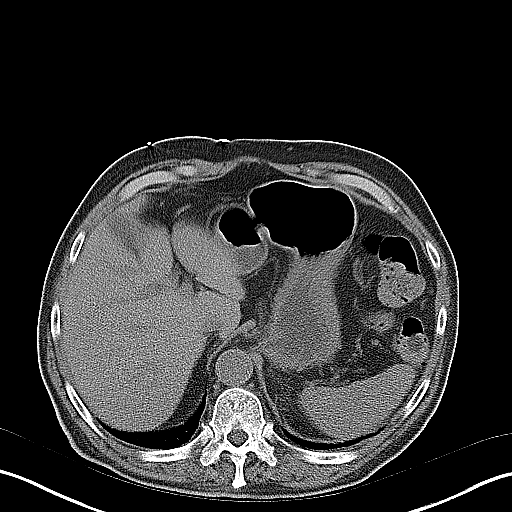
[im 11/24  soft-tissue]
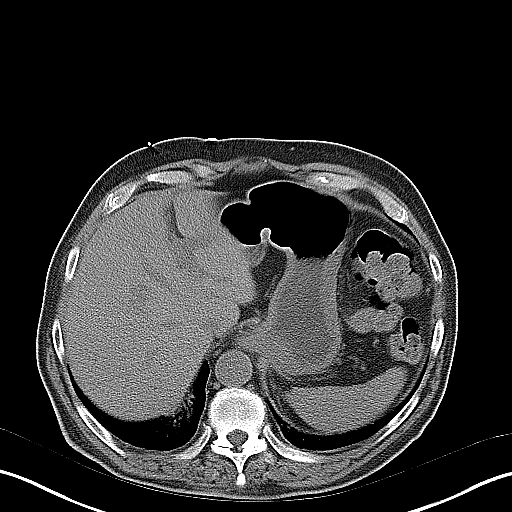
[im 13/24  soft-tissue]
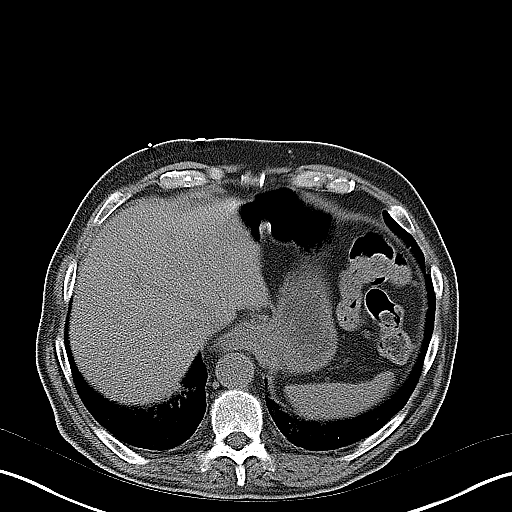
[im 14/24  soft-tissue]
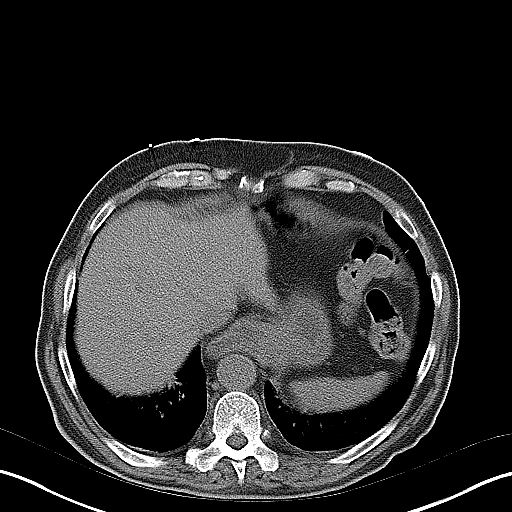
[im 16/24  soft-tissue]
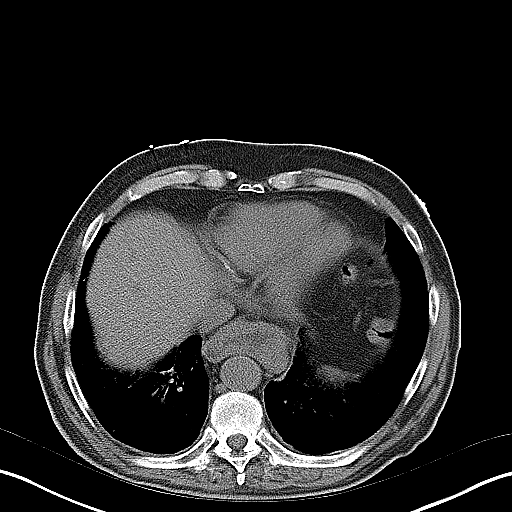
[im 16/24  bone]
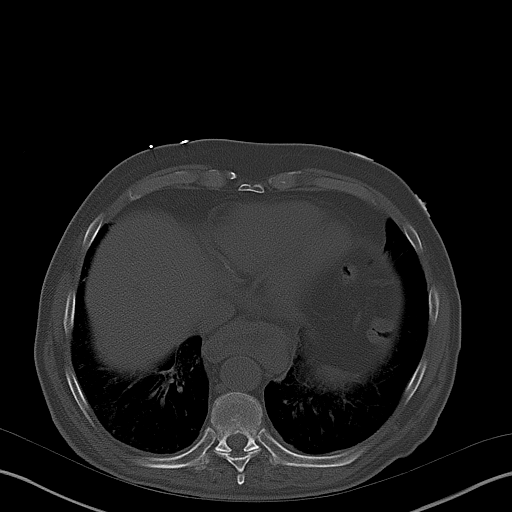
[im 18/24  soft-tissue]
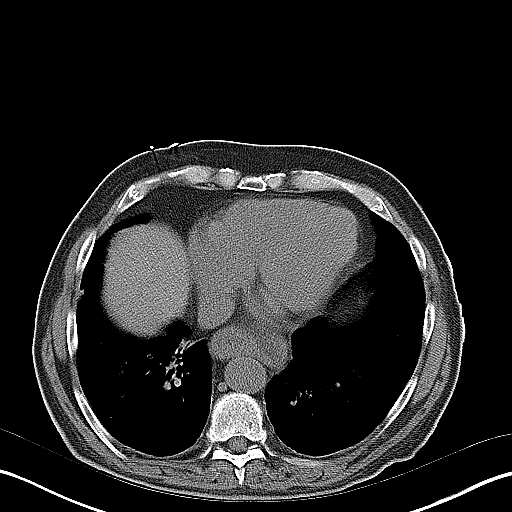
[im 19/24  soft-tissue]
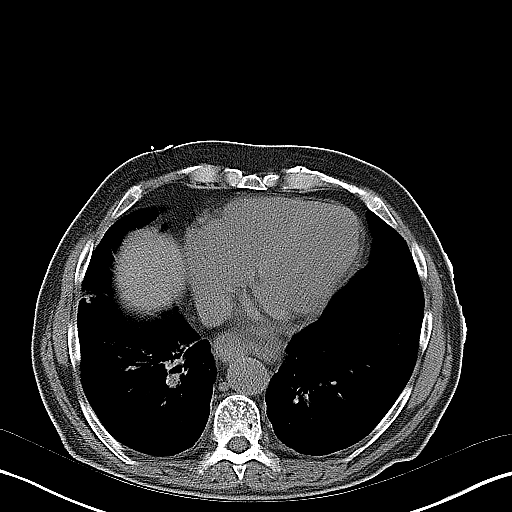
[im 20/24  lung]
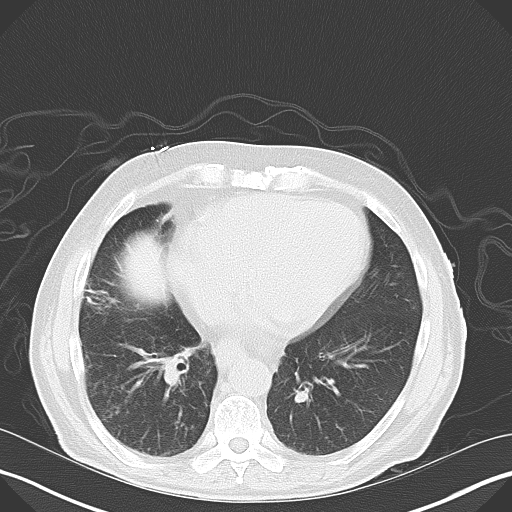
[im 21/24  soft-tissue]
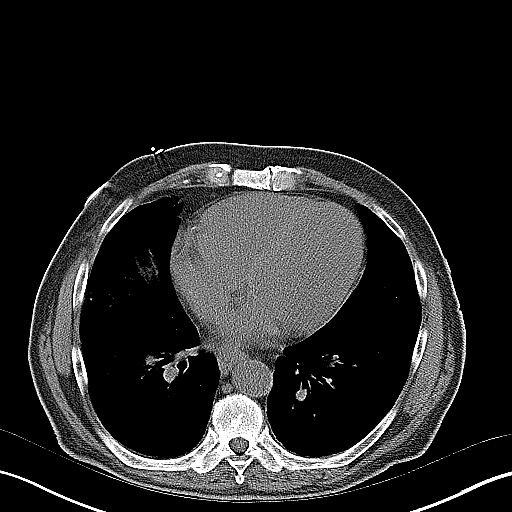
[im 21/24  lung]
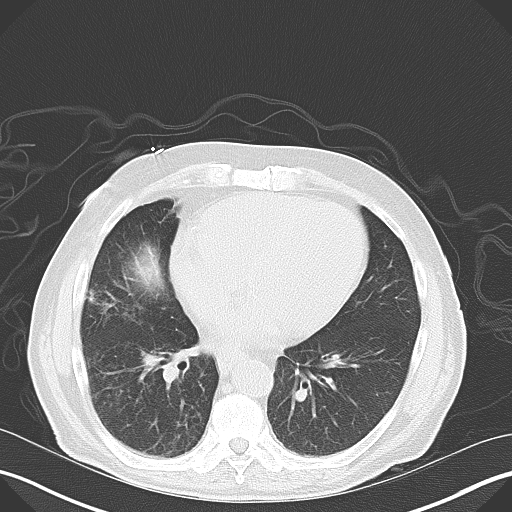
[im 22/24  lung]
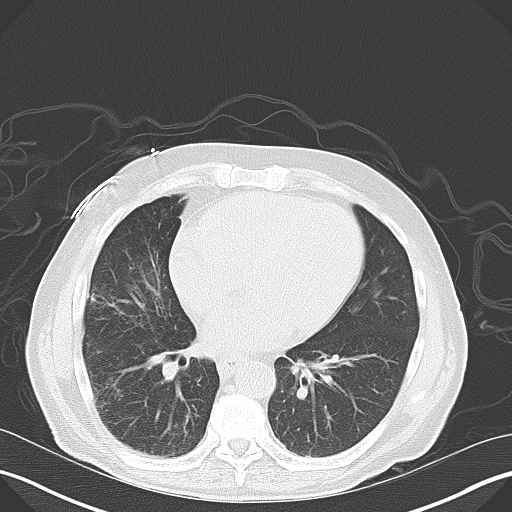
[im 23/24  soft-tissue]
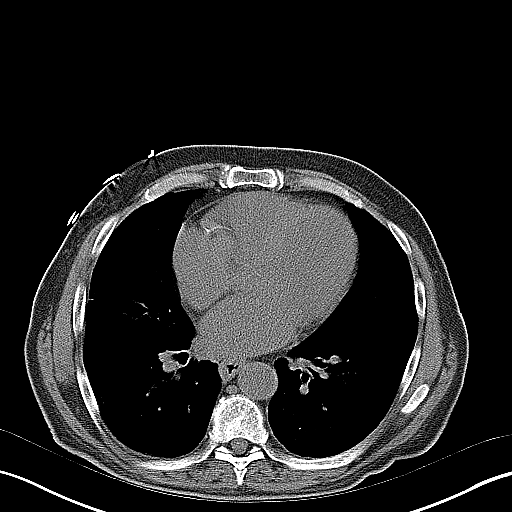
[im 23/24  lung]
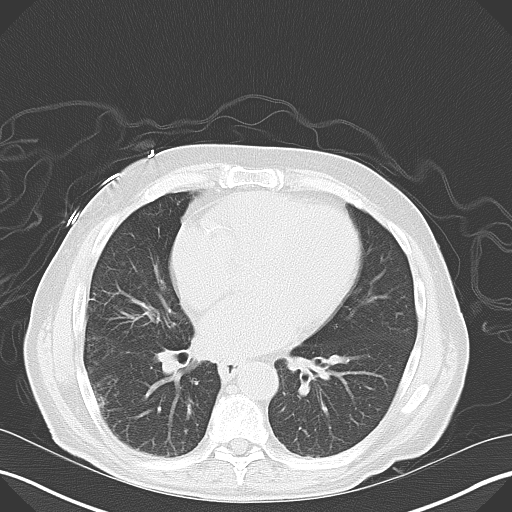

[15 of 24 positions shown; findings below may reference images not displayed]

FINDINGS: No pleural effusions identified. Diffuse tree-in-bud nodularity in
both lower lobes is again identified. There is no pericardial
effusion. Calcified atherosclerotic disease is noted involving RCA
Coronary artery. The patient has a moderate size hiatal hernia.

No focal liver abnormality identified. There are 2 stones within the
gallbladder measuring up to 6 mm. No biliary dilatation. Normal
appearance of the pancreas. The spleen is on unremarkable.

Normal appearance of the right adrenal gland. There is a tiny stone
within the inferior pole the right kidney measuring 2-3 mm. No right
hydronephrosis or hydroureter identified. No ureteral lithiasis.
There is asymmetric left-sided nephromegaly, hydronephrosis and
perinephric fat stranding. Within the mid left ureter there is a
stone measuring 3 mm, image 46/series 2. Urinary bladder appears
normal. There is prostate gland enlargement.

The patient is status post stent graft repair of abdominal aortic
aneurysm. The aneurysm sac measure 4.6 cm in AP dimension which is
unchanged from previous exam. No upper abdominal adenopathy
identified. There is no pelvic or inguinal adenopathy noted.

No free fluid or fluid collections identified.

The small bowel loops have a normal course and caliber. No
obstruction. Normal appearance of the colon

Review of the visualized bony structures is significant for moderate
multi level lumbar spondylosis.
IMPRESSION: 1. Left-sided hydronephrosis and hydroureter secondary to 2 mm mid
left ureteral calculus.
2. Status post stent graft repair of abdominal aortic aneurysm. This
is unchanged in AP dimension compared with previous exam.
3. Hiatal hernia
4. Scattered tree-in-bud nodules in the lung bases are likely the
sequelae of chronic inflammation or infection.

## 2014-11-15 ENCOUNTER — Encounter: Payer: Self-pay | Admitting: Surgery

## 2014-11-19 ENCOUNTER — Encounter: Payer: Self-pay | Admitting: Surgery

## 2014-11-19 ENCOUNTER — Ambulatory Visit (INDEPENDENT_AMBULATORY_CARE_PROVIDER_SITE_OTHER): Payer: Medicare Other | Admitting: Surgery

## 2014-11-19 ENCOUNTER — Ambulatory Visit (HOSPITAL_COMMUNITY)
Admission: RE | Admit: 2014-11-19 | Discharge: 2014-11-19 | Disposition: A | Discharge status: Home / Self Care | Payer: Medicare Other | Source: Ambulatory Visit | Attending: Surgery | Admitting: Surgery

## 2014-11-19 VITALS — BP 144/67 | HR 45 | Ht 66.5 in | Wt 157.4 lb

## 2014-11-19 DIAGNOSIS — Z48812 Encounter for surgical aftercare following surgery on the circulatory system: Secondary | ICD-10-CM

## 2014-11-19 DIAGNOSIS — Z95828 Presence of other vascular implants and grafts: Secondary | ICD-10-CM | POA: Diagnosis not present

## 2014-11-19 DIAGNOSIS — I714 Abdominal aortic aneurysm, without rupture, unspecified: Secondary | ICD-10-CM

## 2014-11-19 NOTE — Addendum Note (Signed)
Addended by: Dorthula Rue L on: 11/19/2014 02:52 PM   Modules accepted: Orders

## 2014-11-19 NOTE — Progress Notes (Signed)
Patient name: Bryan Ford MRN: [id number] DOB: 28-Feb-1926 Sex: male     Chief Complaint  Patient presents with  . Re-evaluation    6 month f/u AAA    HISTORY OF PRESENT ILLNESS: Patient is back today for a 6 month followup. On 12/16/2012, he underwent percutaneous endovascular aneurysm repair using a Gore Excluder device. He had a type II endoleak.  He underwent CT scan in November 2015 which revealed a decreased opacification of his endoleak and a slightly small aneurysm sac with maximum diameter 4.8 cm.  He is back today for follow-up.  He reports no interval change.  Past Medical History  Diagnosis Date  . Ulcerative colitis     Takes mesalamine  . Hyperlipidemia   . Chronic kidney disease, stage 3   . Esophageal reflux   . ED (erectile dysfunction)   . AAA (abdominal aortic aneurysm) without rupture     Abdominal ultrasound showed an increase from 4.1 to 5.3 cm diameter AAA.  Marland Kitchen Borderline hypertension   . AAA (abdominal aortic aneurysm)   . Pneumonia 08-2013    Past Surgical History  Procedure Laterality Date  . Kidney stone surgery  1970s  . Tonsillectomy    . Mandible fracture surgery  college  . Abdominal aortic endovascular stent graft N/A 12/16/2012    Procedure: ABDOMINAL AORTIC ENDOVASCULAR STENT GRAFT-GORE;  Surgeon: Serafina Mitchell, MD;  Location: [facility];  Service: Vascular;  Laterality: N/A;  Ultrasound guided  . Esophagogastroduodenoscopy N/A 12/26/2013    Procedure: ESOPHAGOGASTRODUODENOSCOPY (EGD);  Surgeon: Winfield Cunas., MD;  Location: [facility];  Service: Endoscopy;  Laterality: N/A;  . Savory dilation N/A 12/26/2013    Procedure: SAVORY DILATION;  Surgeon: Winfield Cunas., MD;  Location: [facility];  Service: Endoscopy;  Laterality: N/A;    History   Social History  . Marital Status: Married    Spouse Name: Or rub  . Number of Children: 3  . Years of Education: N/A   Occupational History  . Retired At [employer]     Social History Main Topics  . Smoking status: Former Smoker    Types: Cigarettes, Cigars    Quit date: 11/01/1973  . Smokeless tobacco: Never Used  . Alcohol Use: 1.0 - 1.5 oz/week    2-3 Standard drinks or equivalent per week     Comment: occasionally, but not every week  . Drug Use: No  . Sexual Activity: Not on file   Other Topics Concern  . Not on file   Social History Narrative   7 range of motion. Very is worse on the ER all day, including mowing the lawn. Also plays golf and walks regularly.    Family History  Problem Relation Age of Onset  . Cancer Mother     Allergies as of 11/19/2014  . (No Known Allergies)    Current Outpatient Prescriptions on File Prior to Visit  Medication Sig Dispense Refill  . mesalamine (LIALDA) 1.2 G EC tablet Take 2.4 g by mouth daily with breakfast.     . ondansetron (ZOFRAN) 4 MG tablet Take 1 tablet (4 mg total) by mouth every 6 (six) hours. (Patient not taking: Reported on 11/19/2014) 12 tablet 0  . oxyCODONE-acetaminophen (PERCOCET/ROXICET) 5-325 MG per tablet Take 1 tablet by mouth every 4 (four) hours as needed. (Patient not taking: Reported on 11/19/2014) 15 tablet 0  . tamsulosin (FLOMAX) 0.4 MG CAPS capsule Take 1 capsule (  0.4 mg total) by mouth daily after supper. (Patient not taking: Reported on 11/19/2014) 7 capsule 0   No current facility-administered medications on file prior to visit.     REVIEW OF SYSTEMS: No changes in the last 6 months  PHYSICAL EXAMINATION:   Vital signs are  Filed Vitals:   11/19/14 1406  BP: 144/67  Pulse: 45  Height: 5' 6.5" (1.689 m)  Weight: 157 lb 6.4 oz (71.396 kg)  SpO2: 98%   Body mass index is 25.03 kg/(m^2). General: The patient appears their stated age. HEENT:  No gross abnormalities Pulmonary:  Non labored breathing Abdomen: Soft and non-tender Musculoskeletal: There are no major deformities. Neurologic: No focal weakness or paresthesias are detected, Skin: There are no  ulcer or rashes noted. Psychiatric: The patient has normal affect. Cardiovascular: Palpable pedal pulses   Diagnostic Studies I have reviewed his ultrasound.  Endoleak was unable to be visualized.  Maximum diameter is 4.8 cm  Assessment: Status post endovascular aneurysm repair Plan: Patient has remained stable over the past 6 months.  No endoleak was seen on ultrasound today.  Maximum diameter was 4.8 cm which is essentially unchanged from 6 months ago.  I have discussed that ultrasound may not detect all endoleak's.  We will need to continue to follow his aneurysm sac.  He will follow up in 6 months with repeat ultrasound.  Eldridge Abrahams, M.D. [facility] Office: [phone] Pager:  [phone]

## 2014-12-14 DIAGNOSIS — Z9622 Myringotomy tube(s) status: Secondary | ICD-10-CM | POA: Diagnosis not present

## 2014-12-14 DIAGNOSIS — H9211 Otorrhea, right ear: Secondary | ICD-10-CM | POA: Diagnosis not present

## 2014-12-14 DIAGNOSIS — H7292 Unspecified perforation of tympanic membrane, left ear: Secondary | ICD-10-CM | POA: Diagnosis not present

## 2014-12-31 DIAGNOSIS — H9071 Mixed conductive and sensorineural hearing loss, unilateral, right ear, with unrestricted hearing on the contralateral side: Secondary | ICD-10-CM | POA: Diagnosis not present

## 2015-02-05 DIAGNOSIS — K219 Gastro-esophageal reflux disease without esophagitis: Secondary | ICD-10-CM | POA: Diagnosis not present

## 2015-02-05 DIAGNOSIS — E785 Hyperlipidemia, unspecified: Secondary | ICD-10-CM | POA: Diagnosis not present

## 2015-02-05 DIAGNOSIS — Z125 Encounter for screening for malignant neoplasm of prostate: Secondary | ICD-10-CM | POA: Diagnosis not present

## 2015-02-05 DIAGNOSIS — E291 Testicular hypofunction: Secondary | ICD-10-CM | POA: Diagnosis not present

## 2015-03-10 DIAGNOSIS — Z23 Encounter for immunization: Secondary | ICD-10-CM | POA: Diagnosis not present

## 2015-03-14 DIAGNOSIS — H9071 Mixed conductive and sensorineural hearing loss, unilateral, right ear, with unrestricted hearing on the contralateral side: Secondary | ICD-10-CM | POA: Diagnosis not present

## 2015-03-14 DIAGNOSIS — H9211 Otorrhea, right ear: Secondary | ICD-10-CM | POA: Diagnosis not present

## 2015-03-14 DIAGNOSIS — H903 Sensorineural hearing loss, bilateral: Secondary | ICD-10-CM | POA: Diagnosis not present

## 2015-03-14 DIAGNOSIS — Z9622 Myringotomy tube(s) status: Secondary | ICD-10-CM | POA: Diagnosis not present

## 2015-03-14 DIAGNOSIS — J331 Polypoid sinus degeneration: Secondary | ICD-10-CM | POA: Diagnosis not present

## 2015-03-26 DIAGNOSIS — J019 Acute sinusitis, unspecified: Secondary | ICD-10-CM | POA: Diagnosis not present

## 2015-05-14 DIAGNOSIS — R197 Diarrhea, unspecified: Secondary | ICD-10-CM | POA: Diagnosis not present

## 2015-05-15 DIAGNOSIS — R197 Diarrhea, unspecified: Secondary | ICD-10-CM | POA: Diagnosis not present

## 2015-05-17 ENCOUNTER — Encounter: Payer: Self-pay | Admitting: Family

## 2015-05-24 ENCOUNTER — Other Ambulatory Visit: Payer: Self-pay | Admitting: *Deleted

## 2015-05-24 DIAGNOSIS — I714 Abdominal aortic aneurysm, without rupture, unspecified: Secondary | ICD-10-CM

## 2015-05-24 DIAGNOSIS — Z95828 Presence of other vascular implants and grafts: Secondary | ICD-10-CM

## 2015-05-27 ENCOUNTER — Ambulatory Visit (HOSPITAL_COMMUNITY)
Admission: RE | Admit: 2015-05-27 | Discharge: 2015-05-27 | Disposition: A | Discharge status: Home / Self Care | Payer: Medicare Other | Source: Ambulatory Visit | Attending: Family | Admitting: Family

## 2015-05-27 ENCOUNTER — Encounter: Payer: Self-pay | Admitting: Family

## 2015-05-27 ENCOUNTER — Other Ambulatory Visit: Payer: Self-pay | Admitting: Surgery

## 2015-05-27 ENCOUNTER — Ambulatory Visit (INDEPENDENT_AMBULATORY_CARE_PROVIDER_SITE_OTHER): Payer: Medicare Other | Admitting: Family

## 2015-05-27 VITALS — BP 165/66 | HR 46 | Ht 66.5 in | Wt 170.0 lb

## 2015-05-27 DIAGNOSIS — I714 Abdominal aortic aneurysm, without rupture, unspecified: Secondary | ICD-10-CM

## 2015-05-27 DIAGNOSIS — Z48812 Encounter for surgical aftercare following surgery on the circulatory system: Secondary | ICD-10-CM

## 2015-05-27 DIAGNOSIS — Z95828 Presence of other vascular implants and grafts: Secondary | ICD-10-CM | POA: Insufficient documentation

## 2015-05-27 NOTE — Progress Notes (Signed)
[facility] OF [place]  Established EVAR  History of Present Illness  Bryan Ford is a 82 y.o. (May 08, 1926) male patient of Dr. Trula Slade who returns today for followup. On 12/16/2012, he underwent percutaneous endovascular aneurysm repair using a Gore Excluder device. He had a type II endoleak. He underwent CT scan in November 2015 which revealed a decreased opacification of his endoleak and a slightly small aneurysm sac with maximum diameter 4.8 cm. He is back today for follow-up. He reports no interval change.  The patient was last evaluated by Dr. Trula Slade on 11/19/14. At that time no endoleak was seen on ultrasound. Maximum diameter was 4.8 cm which was essentially unchanged from 6 months prior. Dr. Trula Slade discussed with the pt that ultrasound may not detect all endoleak's. We will need to continue to follow his aneurysm sac. He returns today for 6 months repeat ultrasound.  Pt denies any back or abdominal pain. He had pneumonia within the last year while on vacation in [place], was hospitalized there.    He denies any claudication sx's with walking. Pt states he usually walks the 18 holes of golf. Wife states he does considerable amount of yardwork.  Pt denies light headedness, denies chest pain, denies dyspnea.   Most recent CTA (Date: 03/19/2014) demonstrates:stable aortic stent graft repair of the infrarenal abdominal aortic Aneurysm. Slightly smaller native aneurysm sac diameter measuring 4.5 x 4.7 cm, previously 4.8 x 5.1 cm. Stable subtle type 2 endoleak via a right lumbar artery.  Pt Diabetic: No Pt smoker: former smoker, quit in the 1970's, started smoking in his 20's   Past Medical History  Diagnosis Date  . Ulcerative colitis ([place])     Takes mesalamine  . Hyperlipidemia   . Chronic kidney disease, stage 3   . Esophageal reflux   . ED (erectile dysfunction)   . AAA (abdominal aortic aneurysm) without rupture (HCC)     Abdominal ultrasound  showed an increase from 4.1 to 5.3 cm diameter AAA.  Marland Kitchen Borderline hypertension   . AAA (abdominal aortic aneurysm) ([place])   . Pneumonia 08-2013   Past Surgical History  Procedure Laterality Date  . Kidney stone surgery  1970s  . Tonsillectomy    . Mandible fracture surgery  college  . Abdominal aortic endovascular stent graft N/A 12/16/2012    Procedure: ABDOMINAL AORTIC ENDOVASCULAR STENT GRAFT-GORE;  Surgeon: Serafina Mitchell, MD;  Location: [facility];  Service: Vascular;  Laterality: N/A;  Ultrasound guided  . Esophagogastroduodenoscopy N/A 12/26/2013    Procedure: ESOPHAGOGASTRODUODENOSCOPY (EGD);  Surgeon: Winfield Cunas., MD;  Location: [facility];  Service: Endoscopy;  Laterality: N/A;  . Savory dilation N/A 12/26/2013    Procedure: SAVORY DILATION;  Surgeon: Winfield Cunas., MD;  Location: [facility];  Service: Endoscopy;  Laterality: N/A;   Social History Social History  Substance Use Topics  . Smoking status: Former Smoker    Types: Cigarettes, Cigars    Quit date: 11/01/1973  . Smokeless tobacco: Never Used  . Alcohol Use: 1.0 - 1.5 oz/week    2-3 Standard drinks or equivalent per week     Comment: occasionally, but not every week   Family History Family History  Problem Relation Age of Onset  . Cancer Mother    Current Outpatient Prescriptions on File Prior to Visit  Medication Sig Dispense Refill  . mesalamine (LIALDA) 1.2 G EC tablet Take 2.4 g by mouth daily with breakfast.     . ondansetron (ZOFRAN) 4  MG tablet Take 1 tablet (4 mg total) by mouth every 6 (six) hours. (Patient not taking: Reported on 11/19/2014) 12 tablet 0  . oxyCODONE-acetaminophen (PERCOCET/ROXICET) 5-325 MG per tablet Take 1 tablet by mouth every 4 (four) hours as needed. (Patient not taking: Reported on 11/19/2014) 15 tablet 0  . tamsulosin (FLOMAX) 0.4 MG CAPS capsule Take 1 capsule (0.4 mg total) by mouth daily after supper. (Patient not taking: Reported on 11/19/2014) 7 capsule 0   No  current facility-administered medications on file prior to visit.   No Known Allergies   ROS: See HPI for pertinent positives and negatives.  Physical Examination  Filed Vitals:   05/27/15 0827 05/27/15 0831  BP: 164/67 165/66  Pulse: 46   Height: 5' 6.5" (1.689 m)   Weight: 170 lb (77.111 kg)   SpO2: 99%    Body mass index is 27.03 kg/(m^2).  General: A&O x 3, WD  Pulmonary: Sym exp, good air movt, CTAB, no rales, rhonchi, or wheezing   Cardiac: Bradycardic, Nl S1, S2, no murmur appreciated  Vascular: Vessel Right Left  Radial 3+Palpable 3+Palpable  Carotid  without bruit  without bruit  Aorta Not palpable N/A  Femoral 3+Palpable 2+Palpable  Popliteal 2+ palpable 2+ palpable  PT 1+Palpable 1+Palpable  DP 2+Palpable 2+Palpable   Gastrointestinal: soft, NTND, -G/R, - HSM, - palpable masses, - CVAT B.  Musculoskeletal: M/S 5/5 throughout, extremities without ischemic changes.  Neurologic: Pain and light touch intact in extremities, Motor exam as listed above. Is hard of hearing, hearing aids in place.    Non-Invasive Vascular Imaging  EVAR Duplex (Date: 05/27/2015) ABDOMINAL AORTA DUPLEX EVALUATION - POST ENDOVASCULAR REPAIR    INDICATION: Abdominal Aortic Aneurysm    PREVIOUS INTERVENTION(S): Endovascular repair 12/16/2012 with history of type II endoleak.    DUPLEX EXAM:      DIAMETER AP (cm) DIAMETER TRANSVERSE (cm) VELOCITIES (cm/sec)  Aorta 4.57 5.45 68  Right Common Iliac 1.44 1.55 97  Left Common Iliac 1.09 1.16 128    Comparison Study       Date DIAMETER AP (cm) DIAMETER TRANSVERSE (cm)  11/19/2014 4.5 4.8     ADDITIONAL FINDINGS:     IMPRESSION: Abdominal aortic sac present measuring 4.57cm AP x 5.45cm TRV. Unable to visualize endoleak.    Compared to the previous exam:  Increase in diameter since previous study on 11/19/2014.     CTA Abd/Pelvis Duplex (Date: 03/19/2014) IMPRESSION: Stable aortic stent graft repair of the infrarenal  abdominal aortic aneurysm.  Slightly smaller native aneurysm sac diameter measuring 4.5 x 4.7 cm, previously 4.8 x 5.1 cm.  Stable subtle type 2 endoleak via a right lumbar artery.   Electronically Signed  By: Daryll Brod M.D.  On: 03/19/2014 13:30  Medical Decision Making  Bryan Ford is a 82 y.o. male who presents s/p EVAR (Date:  12/16/2012).  Pt is asymptomatic with an increase in sac size.   I advised pt to see his PCP as soon as possible re his elevated blood pressure; he has an appointment in the next couple of weeks.  His last CT performed result on file was to evaluate his EVAR on 03/19/14; pt and his wife concur with this as his last imaging other than a CXR within the last year.   I discussed with the patient the importance of surveillance of the endograft.  I discussed with Dr. Trula Slade the result of today's EVAR duplex  The next CTA will be scheduled for 6 months.  The  patient will follow up with Dr. Trula Slade months with these studies.  I emphasized the importance of maximal medical management including strict control of blood pressure, blood glucose, and lipid levels, antiplatelet agents, obtaining regular exercise, and cessation of smoking.   Thank you for allowing [place] to participate in this patient's care.  Clemon Chambers, RN, MSN, FNP-C [facility] [place] Office: [facility] Physician: Trula Slade  05/27/2015, 8:34 AM

## 2015-05-27 NOTE — Addendum Note (Signed)
Addended by: Dorthula Rue L on: 05/27/2015 11:57 AM   Modules accepted: Orders

## 2015-07-04 ENCOUNTER — Other Ambulatory Visit: Payer: Self-pay | Admitting: Gastroenterology

## 2015-07-11 DIAGNOSIS — N183 Chronic kidney disease, stage 3 (moderate): Secondary | ICD-10-CM | POA: Diagnosis not present

## 2015-07-11 DIAGNOSIS — I714 Abdominal aortic aneurysm, without rupture: Secondary | ICD-10-CM | POA: Diagnosis not present

## 2015-07-11 DIAGNOSIS — K5190 Ulcerative colitis, unspecified, without complications: Secondary | ICD-10-CM | POA: Diagnosis not present

## 2015-07-11 DIAGNOSIS — E785 Hyperlipidemia, unspecified: Secondary | ICD-10-CM | POA: Diagnosis not present

## 2015-11-06 ENCOUNTER — Encounter: Payer: Self-pay | Admitting: Surgery

## 2015-11-07 ENCOUNTER — Other Ambulatory Visit: Payer: Self-pay | Admitting: Surgery

## 2015-11-07 LAB — CREATININE, SERUM: CREATININE: 1.41 mg/dL — AB (ref 0.70–1.11)

## 2015-11-11 ENCOUNTER — Ambulatory Visit
Admission: RE | Admit: 2015-11-11 | Discharge: 2015-11-11 | Disposition: A | Discharge status: Home / Self Care | Payer: Medicare Other | Source: Ambulatory Visit | Attending: Family | Admitting: Family

## 2015-11-11 DIAGNOSIS — I714 Abdominal aortic aneurysm, without rupture, unspecified: Secondary | ICD-10-CM

## 2015-11-11 DIAGNOSIS — Z95828 Presence of other vascular implants and grafts: Secondary | ICD-10-CM

## 2015-11-11 DIAGNOSIS — Z48812 Encounter for surgical aftercare following surgery on the circulatory system: Secondary | ICD-10-CM

## 2015-11-11 MED ORDER — IOPAMIDOL (ISOVUE-370) INJECTION 76%
75.0000 mL | Freq: Once | INTRAVENOUS | Status: AC | PRN
  Administered 2015-11-11: 75 mL via INTRAVENOUS

## 2015-11-18 ENCOUNTER — Ambulatory Visit: Payer: Medicare Other | Admitting: Surgery

## 2015-11-27 ENCOUNTER — Ambulatory Visit: Payer: Medicare Other | Admitting: Surgery

## 2015-12-12 ENCOUNTER — Encounter: Payer: Self-pay | Admitting: Surgery

## 2015-12-16 ENCOUNTER — Encounter: Payer: Self-pay | Admitting: Surgery

## 2015-12-16 ENCOUNTER — Ambulatory Visit (INDEPENDENT_AMBULATORY_CARE_PROVIDER_SITE_OTHER): Payer: Medicare Other | Admitting: Surgery

## 2015-12-16 VITALS — BP 143/62 | HR 52 | Temp 97.5°F | Resp 16 | Ht 66.5 in | Wt 160.0 lb

## 2015-12-16 DIAGNOSIS — I714 Abdominal aortic aneurysm, without rupture, unspecified: Secondary | ICD-10-CM

## 2015-12-16 DIAGNOSIS — Z48812 Encounter for surgical aftercare following surgery on the circulatory system: Secondary | ICD-10-CM | POA: Diagnosis not present

## 2015-12-16 NOTE — Progress Notes (Signed)
[facility] of [place]  Patient name: Bryan Ford MRN: [id number] DOB: Nov 29, 1925 Sex: male  REASON FOR VISIT: follow up  HPI: Patient is back today for a 6 month followup. On 12/16/2012, he underwent percutaneous endovascular aneurysm repair using a Gore Excluder device. He had a type II endoleak.  He underwent CT scan in November 2015 which revealed a decreased opacification of his endoleak and a slightly small aneurysm sac with maximum diameter 4.8 cm.  He reports no complaints today.  He denies any abdominal pain.  Past Medical History:  Diagnosis Date  . AAA (abdominal aortic aneurysm) ([place])   . AAA (abdominal aortic aneurysm) without rupture (HCC)    Abdominal ultrasound showed an increase from 4.1 to 5.3 cm diameter AAA.  Marland Kitchen Borderline hypertension   . Chronic kidney disease, stage 3   . ED (erectile dysfunction)   . Esophageal reflux   . Hyperlipidemia   . Pneumonia 08-2013  . Ulcerative colitis ([place])    Takes mesalamine    Family History  Problem Relation Age of Onset  . Cancer Mother     SOCIAL HISTORY: Social History  Substance Use Topics  . Smoking status: Former Smoker    Types: Cigarettes, Cigars    Quit date: 11/01/1973  . Smokeless tobacco: Never Used  . Alcohol use 1.0 - 1.5 oz/week    2 - 3 Standard drinks or equivalent per week     Comment: occasionally, but not every week    No Known Allergies  Current Outpatient Prescriptions  Medication Sig Dispense Refill  . aspirin 81 MG tablet Take 81 mg by mouth daily.    . mesalamine (LIALDA) 1.2 G EC tablet Take 2.4 g by mouth daily with breakfast.     . ondansetron (ZOFRAN) 4 MG tablet Take 1 tablet (4 mg total) by mouth every 6 (six) hours. 12 tablet 0  . oxyCODONE-acetaminophen (PERCOCET/ROXICET) 5-325 MG per tablet Take 1 tablet by mouth every 4 (four) hours as needed. 15 tablet 0  . tamsulosin (FLOMAX) 0.4 MG CAPS capsule Take 1 capsule (0.4  mg total) by mouth daily after supper. 7 capsule 0   No current facility-administered medications for this visit.     REVIEW OF SYSTEMS:  [X]  denotes positive finding, [ ]  denotes negative finding Cardiac  Comments:  Chest pain or chest pressure:    Shortness of breath upon exertion:    Short of breath when lying flat:    Irregular heart rhythm:        Vascular    Pain in calf, thigh, or hip brought on by ambulation:    Pain in feet at night that wakes you up from your sleep:     Blood clot in your veins:    Leg swelling:         Pulmonary    Oxygen at home:    Productive cough:     Wheezing:         Neurologic    Sudden weakness in arms or legs:     Sudden numbness in arms or legs:     Sudden onset of difficulty speaking or slurred speech:    Temporary loss of vision in one eye:     Problems with dizziness:         Gastrointestinal    Blood in stool:     Vomited blood:         Genitourinary    Burning when urinating:  Blood in urine:        Psychiatric    Major depression:         Hematologic    Bleeding problems:    Problems with blood clotting too easily:        Skin    Rashes or ulcers:        Constitutional    Fever or chills:      PHYSICAL EXAM: Vitals:   12/16/15 1513 12/16/15 1515  BP: (!) 143/43 (!) 143/62  Pulse: (!) 52   Resp: 16   Temp: 97.5 F (36.4 C)   TempSrc: Oral   SpO2: 98%   Weight: 160 lb (72.6 kg)   Height: 5' 6.5" (1.689 m)     GENERAL: The patient is a well-nourished male, in no acute distress. The vital signs are documented above. CARDIAC: There is a regular rate and rhythm.  VASCULAR: Palpable pedal pulses.  No carotid bruit. PULMONARY: There is good air exchange bilaterally without wheezing or rales. ABDOMEN: Soft and non-tender with normal pitched bowel sounds.  No pulsatile mass MUSCULOSKELETAL: There are no major deformities or cyanosis. NEUROLOGIC: No focal weakness or paresthesias are detected. SKIN: There  are no ulcers or rashes noted. PSYCHIATRIC: The patient has a normal affect.  DATA:  Ultrasound was ordered and reviewed.  This shows a maximum diameter of 4.8 cm.  MEDICAL ISSUES: Status post endovascular aneurysm repair: By ultrasound, the patient's aneurysm remains stable at 4.8 cm.  I discussed that the next follow-up will be in one year with an ultrasound.    [name], MD [facility] [phone] Pager [phone]

## 2015-12-30 NOTE — Addendum Note (Signed)
Addended by: Mena Goes on: 12/30/2015 05:31 PM   Modules accepted: Orders

## 2016-12-01 ENCOUNTER — Encounter: Payer: Self-pay | Admitting: Surgery

## 2016-12-21 ENCOUNTER — Ambulatory Visit (HOSPITAL_COMMUNITY)
Admission: RE | Admit: 2016-12-21 | Discharge: 2016-12-21 | Disposition: A | Discharge status: Home / Self Care | Payer: Medicare Other | Source: Ambulatory Visit | Attending: Surgery | Admitting: Surgery

## 2016-12-21 ENCOUNTER — Ambulatory Visit (INDEPENDENT_AMBULATORY_CARE_PROVIDER_SITE_OTHER): Payer: Medicare Other | Admitting: Surgery

## 2016-12-21 ENCOUNTER — Encounter: Payer: Self-pay | Admitting: Surgery

## 2016-12-21 VITALS — BP 155/55 | HR 49 | Temp 98.0°F | Resp 16 | Ht 66.0 in | Wt 161.0 lb

## 2016-12-21 DIAGNOSIS — I714 Abdominal aortic aneurysm, without rupture, unspecified: Secondary | ICD-10-CM

## 2016-12-21 DIAGNOSIS — Z48812 Encounter for surgical aftercare following surgery on the circulatory system: Secondary | ICD-10-CM | POA: Insufficient documentation

## 2016-12-21 NOTE — Progress Notes (Signed)
[facility] Specialist of [place]  Patient name: Bryan Ford MRN: [id number] DOB: Sep 12, 1925 Sex: male   REASON FOR VISIT:    Follow up  HISOTRY OF PRESENT ILLNESS:    Bryan Ford is a 83 y.o. male who is status post percutaneous endovascular abdominal aortic aneurysm repair on 12/16/2012.  He had a type II endoleak initially.  A CT scan in November 2015 showed decreased opacification of his endoleak with a slight decrease in the maximum aortic diameter, 4.8 cm.    He is here today for follow-up.  He has no complaints.  He denies any abdominal pain.   PAST MEDICAL HISTORY:   Past Medical History:  Diagnosis Date  . AAA (abdominal aortic aneurysm) ([place])   . AAA (abdominal aortic aneurysm) without rupture (HCC)    Abdominal ultrasound showed an increase from 4.1 to 5.3 cm diameter AAA.  Marland Kitchen Borderline hypertension   . Chronic kidney disease, stage 3   . ED (erectile dysfunction)   . Esophageal reflux   . Hyperlipidemia   . Pneumonia 08-2013  . Ulcerative colitis ([place])    Takes mesalamine     FAMILY HISTORY:   Family History  Problem Relation Age of Onset  . Cancer Mother     SOCIAL HISTORY:   Social History  Substance Use Topics  . Smoking status: Former Smoker    Types: Cigarettes, Cigars    Quit date: 11/01/1973  . Smokeless tobacco: Never Used  . Alcohol use 1.0 - 1.5 oz/week    2 - 3 Standard drinks or equivalent per week     Comment: occasionally, but not every week     ALLERGIES:   No Known Allergies   CURRENT MEDICATIONS:   Current Outpatient Prescriptions  Medication Sig Dispense Refill  . aspirin 81 MG tablet Take 81 mg by mouth daily.    . mesalamine (LIALDA) 1.2 G EC tablet Take 2.4 g by mouth daily with breakfast.     . ondansetron (ZOFRAN) 4 MG tablet Take 1 tablet (4 mg total) by mouth every 6 (six) hours. 12 tablet 0  . oxyCODONE-acetaminophen (PERCOCET/ROXICET) 5-325 MG per tablet Take 1  tablet by mouth every 4 (four) hours as needed. 15 tablet 0  . tamsulosin (FLOMAX) 0.4 MG CAPS capsule Take 1 capsule (0.4 mg total) by mouth daily after supper. 7 capsule 0   No current facility-administered medications for this visit.     REVIEW OF SYSTEMS:   [X]  denotes positive finding, [ ]  denotes negative finding Cardiac  Comments:  Chest pain or chest pressure:    Shortness of breath upon exertion:    Short of breath when lying flat:    Irregular heart rhythm:        Vascular    Pain in calf, thigh, or hip brought on by ambulation:    Pain in feet at night that wakes you up from your sleep:     Blood clot in your veins:    Leg swelling:         Pulmonary    Oxygen at home:    Productive cough:     Wheezing:         Neurologic    Sudden weakness in arms or legs:     Sudden numbness in arms or legs:     Sudden onset of difficulty speaking or slurred speech:    Temporary loss of vision in one eye:     Problems with dizziness:  Gastrointestinal    Blood in stool:     Vomited blood:         Genitourinary    Burning when urinating:     Blood in urine:        Psychiatric    Major depression:         Hematologic    Bleeding problems:    Problems with blood clotting too easily:        Skin    Rashes or ulcers:        Constitutional    Fever or chills:      PHYSICAL EXAM:   There were no vitals filed for this visit.  GENERAL: The patient is a well-nourished male, in no acute distress. The vital signs are documented above. CARDIAC: There is a regular rate and rhythm.  PULMONARY: Non-labored respirations ABDOMEN: Soft and non-tender with normal pitched bowel sounds.  MUSCULOSKELETAL: There are no major deformities or cyanosis. NEUROLOGIC: No focal weakness or paresthesias are detected. SKIN: There are no ulcers or rashes noted. PSYCHIATRIC: The patient has a normal affect.  STUDIES:   I have ordered and reviewed her ultrasound shows a maximum  diameter 5.5 cm.  This is no significant change  MEDICAL ISSUES:   Status post endovascular aneurysm repair: The patient will follow up in 6 months with a CT scan to further evaluate the endoleak that was seen a year ago    [name], MD [facility] [phone] Pager [phone]

## 2017-05-13 NOTE — Addendum Note (Signed)
Addended by: Lianne Cure A on: 05/13/2017 09:41 AM   Modules accepted: Orders

## 2017-06-23 ENCOUNTER — Other Ambulatory Visit: Payer: Medicare Other

## 2017-06-28 ENCOUNTER — Ambulatory Visit: Payer: Medicare Other | Admitting: Surgery

## 2017-07-01 ENCOUNTER — Encounter (HOSPITAL_COMMUNITY): Payer: Self-pay

## 2017-07-01 ENCOUNTER — Emergency Department (HOSPITAL_COMMUNITY): Payer: Medicare Other

## 2017-07-01 ENCOUNTER — Inpatient Hospital Stay (HOSPITAL_COMMUNITY)
Admission: EM | Admit: 2017-07-01 | Discharge: 2017-07-08 | DRG: 070 | Disposition: A | Discharge status: Skilled Nursing Facility | Payer: Medicare Other | Attending: Family Medicine | Admitting: Family Medicine

## 2017-07-01 DIAGNOSIS — R918 Other nonspecific abnormal finding of lung field: Secondary | ICD-10-CM | POA: Diagnosis present

## 2017-07-01 DIAGNOSIS — I714 Abdominal aortic aneurysm, without rupture, unspecified: Secondary | ICD-10-CM | POA: Diagnosis present

## 2017-07-01 DIAGNOSIS — R937 Abnormal findings on diagnostic imaging of other parts of musculoskeletal system: Secondary | ICD-10-CM | POA: Diagnosis present

## 2017-07-01 DIAGNOSIS — W19XXXA Unspecified fall, initial encounter: Secondary | ICD-10-CM

## 2017-07-01 DIAGNOSIS — K529 Noninfective gastroenteritis and colitis, unspecified: Secondary | ICD-10-CM | POA: Diagnosis not present

## 2017-07-01 DIAGNOSIS — R059 Cough, unspecified: Secondary | ICD-10-CM

## 2017-07-01 DIAGNOSIS — E871 Hypo-osmolality and hyponatremia: Secondary | ICD-10-CM | POA: Diagnosis not present

## 2017-07-01 DIAGNOSIS — N183 Chronic kidney disease, stage 3 unspecified: Secondary | ICD-10-CM | POA: Diagnosis present

## 2017-07-01 DIAGNOSIS — R531 Weakness: Secondary | ICD-10-CM | POA: Diagnosis present

## 2017-07-01 DIAGNOSIS — D638 Anemia in other chronic diseases classified elsewhere: Secondary | ICD-10-CM | POA: Diagnosis present

## 2017-07-01 DIAGNOSIS — S8262XA Displaced fracture of lateral malleolus of left fibula, initial encounter for closed fracture: Secondary | ICD-10-CM

## 2017-07-01 DIAGNOSIS — R05 Cough: Secondary | ICD-10-CM

## 2017-07-01 DIAGNOSIS — M25473 Effusion, unspecified ankle: Secondary | ICD-10-CM

## 2017-07-01 DIAGNOSIS — W182XXA Fall in (into) shower or empty bathtub, initial encounter: Secondary | ICD-10-CM | POA: Diagnosis present

## 2017-07-01 DIAGNOSIS — R402413 Glasgow coma scale score 13-15, at hospital admission: Secondary | ICD-10-CM | POA: Diagnosis present

## 2017-07-01 DIAGNOSIS — I10 Essential (primary) hypertension: Secondary | ICD-10-CM | POA: Diagnosis present

## 2017-07-01 DIAGNOSIS — Z79899 Other long term (current) drug therapy: Secondary | ICD-10-CM

## 2017-07-01 DIAGNOSIS — Y93E1 Activity, personal bathing and showering: Secondary | ICD-10-CM

## 2017-07-01 DIAGNOSIS — R000 Tachycardia, unspecified: Secondary | ICD-10-CM | POA: Diagnosis present

## 2017-07-01 DIAGNOSIS — D631 Anemia in chronic kidney disease: Secondary | ICD-10-CM | POA: Diagnosis present

## 2017-07-01 DIAGNOSIS — Z95828 Presence of other vascular implants and grafts: Secondary | ICD-10-CM

## 2017-07-01 DIAGNOSIS — R062 Wheezing: Secondary | ICD-10-CM

## 2017-07-01 DIAGNOSIS — B349 Viral infection, unspecified: Secondary | ICD-10-CM | POA: Diagnosis present

## 2017-07-01 DIAGNOSIS — R296 Repeated falls: Secondary | ICD-10-CM | POA: Diagnosis present

## 2017-07-01 DIAGNOSIS — R410 Disorientation, unspecified: Secondary | ICD-10-CM

## 2017-07-01 DIAGNOSIS — K5190 Ulcerative colitis, unspecified, without complications: Secondary | ICD-10-CM | POA: Diagnosis present

## 2017-07-01 DIAGNOSIS — E611 Iron deficiency: Secondary | ICD-10-CM | POA: Diagnosis present

## 2017-07-01 DIAGNOSIS — G912 (Idiopathic) normal pressure hydrocephalus: Secondary | ICD-10-CM | POA: Diagnosis present

## 2017-07-01 DIAGNOSIS — I739 Peripheral vascular disease, unspecified: Secondary | ICD-10-CM | POA: Diagnosis present

## 2017-07-01 DIAGNOSIS — Z7982 Long term (current) use of aspirin: Secondary | ICD-10-CM

## 2017-07-01 DIAGNOSIS — G9341 Metabolic encephalopathy: Principal | ICD-10-CM | POA: Diagnosis present

## 2017-07-01 DIAGNOSIS — E860 Dehydration: Secondary | ICD-10-CM | POA: Diagnosis present

## 2017-07-01 DIAGNOSIS — R159 Full incontinence of feces: Secondary | ICD-10-CM | POA: Diagnosis present

## 2017-07-01 DIAGNOSIS — S82832A Other fracture of upper and lower end of left fibula, initial encounter for closed fracture: Secondary | ICD-10-CM | POA: Diagnosis present

## 2017-07-01 DIAGNOSIS — I129 Hypertensive chronic kidney disease with stage 1 through stage 4 chronic kidney disease, or unspecified chronic kidney disease: Secondary | ICD-10-CM | POA: Diagnosis present

## 2017-07-01 DIAGNOSIS — K219 Gastro-esophageal reflux disease without esophagitis: Secondary | ICD-10-CM | POA: Diagnosis present

## 2017-07-01 DIAGNOSIS — Z6826 Body mass index (BMI) 26.0-26.9, adult: Secondary | ICD-10-CM

## 2017-07-01 DIAGNOSIS — E43 Unspecified severe protein-calorie malnutrition: Secondary | ICD-10-CM | POA: Diagnosis present

## 2017-07-01 DIAGNOSIS — M48061 Spinal stenosis, lumbar region without neurogenic claudication: Secondary | ICD-10-CM | POA: Diagnosis present

## 2017-07-01 DIAGNOSIS — E785 Hyperlipidemia, unspecified: Secondary | ICD-10-CM | POA: Diagnosis present

## 2017-07-01 DIAGNOSIS — Z87891 Personal history of nicotine dependence: Secondary | ICD-10-CM

## 2017-07-01 DIAGNOSIS — N39498 Other specified urinary incontinence: Secondary | ICD-10-CM | POA: Diagnosis present

## 2017-07-01 DIAGNOSIS — R2689 Other abnormalities of gait and mobility: Secondary | ICD-10-CM | POA: Diagnosis present

## 2017-07-01 DIAGNOSIS — Y92002 Bathroom of unspecified non-institutional (private) residence single-family (private) house as the place of occurrence of the external cause: Secondary | ICD-10-CM

## 2017-07-01 DIAGNOSIS — M549 Dorsalgia, unspecified: Secondary | ICD-10-CM | POA: Diagnosis present

## 2017-07-01 DIAGNOSIS — T07XXXA Unspecified multiple injuries, initial encounter: Secondary | ICD-10-CM

## 2017-07-01 DIAGNOSIS — R52 Pain, unspecified: Secondary | ICD-10-CM

## 2017-07-01 DIAGNOSIS — S82433A Displaced oblique fracture of shaft of unspecified fibula, initial encounter for closed fracture: Secondary | ICD-10-CM | POA: Diagnosis present

## 2017-07-01 DIAGNOSIS — R509 Fever, unspecified: Secondary | ICD-10-CM

## 2017-07-01 HISTORY — DX: Personal history of urinary calculi: Z87.442

## 2017-07-01 LAB — COMPREHENSIVE METABOLIC PANEL
ALBUMIN: 3.5 g/dL (ref 3.5–5.0)
ALT: 12 U/L — ABNORMAL LOW (ref 17–63)
ANION GAP: 11 (ref 5–15)
AST: 27 U/L (ref 15–41)
Alkaline Phosphatase: 69 U/L (ref 38–126)
BILIRUBIN TOTAL: 0.9 mg/dL (ref 0.3–1.2)
BUN: 19 mg/dL (ref 6–20)
CO2: 24 mmol/L (ref 22–32)
Calcium: 8.8 mg/dL — ABNORMAL LOW (ref 8.9–10.3)
Chloride: 101 mmol/L (ref 101–111)
Creatinine, Ser: 1.7 mg/dL — ABNORMAL HIGH (ref 0.61–1.24)
GFR, EST AFRICAN AMERICAN: 39 mL/min — AB (ref >60)
GFR, EST NON AFRICAN AMERICAN: 34 mL/min — AB (ref >60)
Glucose, Bld: 115 mg/dL — ABNORMAL HIGH (ref 65–99)
POTASSIUM: 4.6 mmol/L (ref 3.5–5.1)
Sodium: 136 mmol/L (ref 135–145)
TOTAL PROTEIN: 6.9 g/dL (ref 6.5–8.1)

## 2017-07-01 LAB — CBC WITH DIFFERENTIAL/PLATELET
BASOS ABS: 0 10*3/uL (ref 0.0–0.1)
BASOS PCT: 0 %
Eosinophils Absolute: 0 10*3/uL (ref 0.0–0.7)
Eosinophils Relative: 0 %
HEMATOCRIT: 28.5 % — AB (ref 39.0–52.0)
Hemoglobin: 9.3 g/dL — ABNORMAL LOW (ref 13.0–17.0)
LYMPHS PCT: 5 %
Lymphs Abs: 0.5 10*3/uL — ABNORMAL LOW (ref 0.7–4.0)
MCH: 28.5 pg (ref 26.0–34.0)
MCHC: 32.6 g/dL (ref 30.0–36.0)
MCV: 87.4 fL (ref 78.0–100.0)
Monocytes Absolute: 0.9 10*3/uL (ref 0.1–1.0)
Monocytes Relative: 9 %
NEUTROS ABS: 8 10*3/uL — AB (ref 1.7–7.7)
Neutrophils Relative %: 86 %
PLATELETS: 278 10*3/uL (ref 150–400)
RBC: 3.26 MIL/uL — AB (ref 4.22–5.81)
RDW: 15.2 % (ref 11.5–15.5)
WBC: 9.4 10*3/uL (ref 4.0–10.5)

## 2017-07-01 LAB — URINALYSIS, ROUTINE W REFLEX MICROSCOPIC
Bilirubin Urine: NEGATIVE
GLUCOSE, UA: NEGATIVE mg/dL
Hgb urine dipstick: NEGATIVE
Ketones, ur: NEGATIVE mg/dL
LEUKOCYTES UA: NEGATIVE
NITRITE: NEGATIVE
PH: 7 (ref 5.0–8.0)
PROTEIN: NEGATIVE mg/dL
Specific Gravity, Urine: 1.013 (ref 1.005–1.030)

## 2017-07-01 LAB — I-STAT CG4 LACTIC ACID, ED: Lactic Acid, Venous: 1.29 mmol/L (ref 0.5–1.9)

## 2017-07-01 MED ORDER — SODIUM CHLORIDE 0.9 % IV BOLUS (SEPSIS)
1000.0000 mL | Freq: Once | INTRAVENOUS | Status: AC
  Administered 2017-07-01: 1000 mL via INTRAVENOUS

## 2017-07-01 MED ORDER — SODIUM CHLORIDE 0.9 % IV BOLUS (SEPSIS)
500.0000 mL | Freq: Once | INTRAVENOUS | Status: AC
  Administered 2017-07-01: 500 mL via INTRAVENOUS

## 2017-07-01 MED ORDER — ACETAMINOPHEN 650 MG RE SUPP
650.0000 mg | Freq: Once | RECTAL | Status: AC
  Administered 2017-07-01: 650 mg via RECTAL
  Filled 2017-07-01: qty 1

## 2017-07-01 MED ORDER — ACETAMINOPHEN 325 MG PO TABS
650.0000 mg | ORAL_TABLET | Freq: Once | ORAL | Status: DC
  Filled 2017-07-01: qty 2

## 2017-07-01 MED ORDER — FENTANYL CITRATE (PF) 100 MCG/2ML IJ SOLN
100.0000 ug | Freq: Once | INTRAMUSCULAR | Status: AC
  Administered 2017-07-01: 100 ug via INTRAVENOUS
  Filled 2017-07-01: qty 2

## 2017-07-01 MED ORDER — SODIUM CHLORIDE 0.9 % IV SOLN
2.0000 g | Freq: Once | INTRAVENOUS | Status: AC
  Administered 2017-07-01: 2 g via INTRAVENOUS
  Filled 2017-07-01: qty 20

## 2017-07-01 NOTE — ED Triage Notes (Signed)
Pt BIB [facility] for eval of altered mental status and fall in shower today. EMS reports pt fell x 1 last week and was not evaluated. Today pt fell in shower due to unsteadiness on feet thought to be from last weeks fall. Pt reports severe lower back pain, L ankle pain, R hip pain. No obvious deformity/hematom to hip. L ankle swelling. Received 150 mcgs of fentanyl by EMS

## 2017-07-01 NOTE — ED Provider Notes (Signed)
[facility] EMERGENCY DEPARTMENT Provider Note   CSN: [id number] Arrival date & time: 07/01/17  2059     History   Chief Complaint Chief Complaint  Patient presents with  . Fall  . Altered Mental Status    HPI Bryan Ford is a 84 y.o. male.  Here for evaluation of multiple falls, and confusion.  He also injured his left low back, working in the yard about 10 days ago.  He saw his PCP about 1 week ago, had a "physical and x-rays,"and was told they were normal.  In the last few days he has not been eating as much as usual.  No known fever at home.  There is been no cough.  He had a influenza vaccine, this year.  Patient fell yesterday injuring his left ankle.  Overnight he had diarrhea, brown in color.  Today he was taking a shower, and his wife was helping him get out when he tripped and fell backwards, injuring his left hip and low back.  Also after that he spontaneously had copious diarrhea.  His wife helped to clean him up abdomen bed and he slept for several hours.  When she woke him up this evening, he had had urine incontinence, which is unusual.  Because of this scenario she decided to bring him here, by EMS for evaluation.  Patient is unable to give history.  History given by wife and daughter.  There are no other known modifying factors.  HPI  Past Medical History:  Diagnosis Date  . AAA (abdominal aortic aneurysm) ([place])   . AAA (abdominal aortic aneurysm) without rupture (HCC)    Abdominal ultrasound showed an increase from 4.1 to 5.3 cm diameter AAA.  Marland Kitchen Borderline hypertension   . Chronic kidney disease, stage 3 ([place])   . ED (erectile dysfunction)   . Esophageal reflux   . Hyperlipidemia   . Pneumonia 08-2013  . Ulcerative colitis ([place])    Takes mesalamine    Patient Active Problem List   Diagnosis Date Noted  . AAA (abdominal aortic aneurysm) without rupture ([place]) 03/13/2013  . Lung mass 03/13/2013  . Back pain 12/23/2012  . Abdominal  aneurysm without mention of rupture 11/21/2012  . AAA (abdominal aortic aneurysm) ([place]) 11/07/2012  . Hypertension 11/07/2012  . PAD (peripheral artery disease) ([place]) 11/07/2012  . Dyslipidemia, goal LDL below 70 11/07/2012    Past Surgical History:  Procedure Laterality Date  . ABDOMINAL AORTIC ENDOVASCULAR STENT GRAFT N/A 12/16/2012   Procedure: ABDOMINAL AORTIC ENDOVASCULAR STENT GRAFT-GORE;  Surgeon: Serafina Mitchell, MD;  Location: [facility];  Service: Vascular;  Laterality: N/A;  Ultrasound guided  . ESOPHAGOGASTRODUODENOSCOPY N/A 12/26/2013   Procedure: ESOPHAGOGASTRODUODENOSCOPY (EGD);  Surgeon: Winfield Cunas., MD;  Location: [facility];  Service: Endoscopy;  Laterality: N/A;  . KIDNEY STONE SURGERY  1970s  . MANDIBLE FRACTURE SURGERY  college  . SAVORY DILATION N/A 12/26/2013   Procedure: SAVORY DILATION;  Surgeon: Winfield Cunas., MD;  Location: [facility];  Service: Endoscopy;  Laterality: N/A;  . TONSILLECTOMY         Home Medications    Prior to Admission medications   Medication Sig Start Date End Date Taking? Authorizing Provider  aspirin 81 MG tablet Take 81 mg by mouth daily.    [provider]  mesalamine (LIALDA) 1.2 G EC tablet Take 2.4 g by mouth daily with breakfast.     [provider]    Family History Family  History  Problem Relation Age of Onset  . Cancer Mother     Social History Social History   Tobacco Use  . Smoking status: Former Smoker    Types: Cigarettes, Cigars    Last attempt to quit: 11/01/1973    Years since quitting: 43.6  . Smokeless tobacco: Never Used  Substance Use Topics  . Alcohol use: Yes    Alcohol/week: 1.0 - 1.5 oz    Types: 2 - 3 Standard drinks or equivalent per week    Comment: occasionally, but not every week  . Drug use: No     Allergies   Patient has no known allergies.   Review of Systems Review of Systems  Unable to perform ROS: Mental status change     Physical Exam Updated  Vital Signs Wt 77.1 kg (170 lb)   BMI 27.44 kg/m   Physical Exam  Constitutional: He appears well-developed. He appears distressed (Uncomfortable).  Elderly, frail  HENT:  Head: Normocephalic and atraumatic.  Right Ear: External ear normal.  Left Ear: External ear normal.  No tenderness crepitation or deformity of the cranium or scalp.  Eyes: Conjunctivae and EOM are normal. Pupils are equal, round, and reactive to light.  Neck: Normal range of motion and phonation normal. Neck supple.  Cardiovascular: Normal rate, regular rhythm and normal heart sounds.  Pulmonary/Chest: Effort normal and breath sounds normal. He exhibits no tenderness (No chest wall instability.) and no bony tenderness.  Abdominal: Soft. There is no tenderness.  Musculoskeletal:  Patient guards against movement of the left hip.  He is tender on the left hip to palpation, there is no left hip deformity or leg shortening.  Mild tenderness lumbar, bilateral.  No pelvic instability.  Cervical collar on neck.  Neurological: He is alert. No cranial nerve deficit or sensory deficit. He exhibits normal muscle tone. Coordination normal.  Skin: Skin is warm, dry and intact.  Psychiatric: He has a normal mood and affect. His behavior is normal.  Nursing note and vitals reviewed.    ED Treatments / Results  Labs (all labs ordered are listed, but only abnormal results are displayed) Labs Reviewed - No data to display  EKG  EKG Interpretation None       Radiology No results found.  Procedures Procedures (including critical care time)  Medications Ordered in ED Medications - No data to display   Initial Impression / Assessment and Plan / ED Course  I have reviewed the triage vital signs and the nursing notes.  Pertinent labs & imaging results that were available during my care of the patient were reviewed by me and considered in my medical decision making (see chart for details).  Clinical Course as of Jul 01 2157  Thu Jul 01, 2017  2152 Initial evaluation consistent with falls, likely bony injury, and fever with altered mental status.  Comprehensive evaluation and treatment initiated.  [EW]    Clinical Course User Index [EW] Daleen Bo, MD     Patient Vitals for the past 24 hrs:  BP Temp Temp src Pulse Resp SpO2 Weight  07/01/17 2114 (!) 194/75 (!) 101.8 F (38.8 C) Rectal 87 19 100 % -  07/01/17 2113 (!) 194/75 - - - - - -  07/01/17 2109 - - - - - - 77.1 kg (170 lb)    9:58 PM-care to oncoming provider team to evaluate post treatment and imaging.     Final Clinical Impressions(s) / ED Diagnoses   Final diagnoses:  Fall, initial encounter  Multiple contusions  Fever, unspecified fever cause  Confusion   Falls with pain, low back and left hip primarily.  Confusion with fever, diarrhea and urinary incontinence.  Primary suspect for fever and confusion is urinary tract infection.  Comprehensive evaluation ordered.  Treat with IV fluids and empiric antibiotic begun.  Follow-up of treatment results, imaging and labs by oncoming provider team.  Patient will likely require admission  Nursing Notes Reviewed/ Care Coordinated Applicable Imaging Reviewed Interpretation of Laboratory Data incorporated into ED treatment   Plan-evaluate for admission after return of testing   ED Discharge Orders    None       Daleen Bo, MD 07/01/17 2201

## 2017-07-02 ENCOUNTER — Encounter (HOSPITAL_COMMUNITY): Payer: Self-pay | Admitting: Radiology

## 2017-07-02 ENCOUNTER — Emergency Department (HOSPITAL_COMMUNITY): Payer: Medicare Other

## 2017-07-02 ENCOUNTER — Other Ambulatory Visit: Payer: Self-pay

## 2017-07-02 DIAGNOSIS — S8262XA Displaced fracture of lateral malleolus of left fibula, initial encounter for closed fracture: Secondary | ICD-10-CM | POA: Diagnosis present

## 2017-07-02 DIAGNOSIS — M48061 Spinal stenosis, lumbar region without neurogenic claudication: Secondary | ICD-10-CM | POA: Diagnosis present

## 2017-07-02 DIAGNOSIS — G9341 Metabolic encephalopathy: Secondary | ICD-10-CM | POA: Diagnosis not present

## 2017-07-02 DIAGNOSIS — N183 Chronic kidney disease, stage 3 unspecified: Secondary | ICD-10-CM | POA: Diagnosis present

## 2017-07-02 DIAGNOSIS — D631 Anemia in chronic kidney disease: Secondary | ICD-10-CM | POA: Diagnosis present

## 2017-07-02 DIAGNOSIS — E871 Hypo-osmolality and hyponatremia: Secondary | ICD-10-CM | POA: Diagnosis not present

## 2017-07-02 DIAGNOSIS — W19XXXA Unspecified fall, initial encounter: Secondary | ICD-10-CM | POA: Diagnosis present

## 2017-07-02 DIAGNOSIS — R000 Tachycardia, unspecified: Secondary | ICD-10-CM | POA: Diagnosis present

## 2017-07-02 DIAGNOSIS — K529 Noninfective gastroenteritis and colitis, unspecified: Secondary | ICD-10-CM | POA: Diagnosis not present

## 2017-07-02 DIAGNOSIS — R05 Cough: Secondary | ICD-10-CM | POA: Diagnosis not present

## 2017-07-02 DIAGNOSIS — B349 Viral infection, unspecified: Secondary | ICD-10-CM | POA: Diagnosis present

## 2017-07-02 DIAGNOSIS — R531 Weakness: Secondary | ICD-10-CM | POA: Diagnosis present

## 2017-07-02 DIAGNOSIS — R296 Repeated falls: Secondary | ICD-10-CM | POA: Diagnosis present

## 2017-07-02 DIAGNOSIS — R2689 Other abnormalities of gait and mobility: Secondary | ICD-10-CM | POA: Diagnosis present

## 2017-07-02 DIAGNOSIS — E43 Unspecified severe protein-calorie malnutrition: Secondary | ICD-10-CM | POA: Diagnosis present

## 2017-07-02 DIAGNOSIS — K5190 Ulcerative colitis, unspecified, without complications: Secondary | ICD-10-CM | POA: Diagnosis not present

## 2017-07-02 DIAGNOSIS — E860 Dehydration: Secondary | ICD-10-CM | POA: Diagnosis present

## 2017-07-02 DIAGNOSIS — R937 Abnormal findings on diagnostic imaging of other parts of musculoskeletal system: Secondary | ICD-10-CM | POA: Diagnosis present

## 2017-07-02 DIAGNOSIS — Y93E1 Activity, personal bathing and showering: Secondary | ICD-10-CM | POA: Diagnosis not present

## 2017-07-02 DIAGNOSIS — R509 Fever, unspecified: Secondary | ICD-10-CM | POA: Diagnosis not present

## 2017-07-02 DIAGNOSIS — R062 Wheezing: Secondary | ICD-10-CM | POA: Diagnosis not present

## 2017-07-02 DIAGNOSIS — S82433A Displaced oblique fracture of shaft of unspecified fibula, initial encounter for closed fracture: Secondary | ICD-10-CM | POA: Diagnosis present

## 2017-07-02 DIAGNOSIS — D638 Anemia in other chronic diseases classified elsewhere: Secondary | ICD-10-CM | POA: Diagnosis present

## 2017-07-02 DIAGNOSIS — M549 Dorsalgia, unspecified: Secondary | ICD-10-CM | POA: Diagnosis present

## 2017-07-02 DIAGNOSIS — G912 (Idiopathic) normal pressure hydrocephalus: Secondary | ICD-10-CM | POA: Diagnosis present

## 2017-07-02 DIAGNOSIS — K219 Gastro-esophageal reflux disease without esophagitis: Secondary | ICD-10-CM | POA: Diagnosis present

## 2017-07-02 DIAGNOSIS — W182XXA Fall in (into) shower or empty bathtub, initial encounter: Secondary | ICD-10-CM | POA: Diagnosis present

## 2017-07-02 DIAGNOSIS — I129 Hypertensive chronic kidney disease with stage 1 through stage 4 chronic kidney disease, or unspecified chronic kidney disease: Secondary | ICD-10-CM | POA: Diagnosis present

## 2017-07-02 DIAGNOSIS — I714 Abdominal aortic aneurysm, without rupture: Secondary | ICD-10-CM | POA: Diagnosis not present

## 2017-07-02 DIAGNOSIS — I10 Essential (primary) hypertension: Secondary | ICD-10-CM | POA: Diagnosis not present

## 2017-07-02 DIAGNOSIS — Y92002 Bathroom of unspecified non-institutional (private) residence single-family (private) house as the place of occurrence of the external cause: Secondary | ICD-10-CM | POA: Diagnosis not present

## 2017-07-02 DIAGNOSIS — R159 Full incontinence of feces: Secondary | ICD-10-CM | POA: Diagnosis present

## 2017-07-02 DIAGNOSIS — R402413 Glasgow coma scale score 13-15, at hospital admission: Secondary | ICD-10-CM | POA: Diagnosis present

## 2017-07-02 DIAGNOSIS — E611 Iron deficiency: Secondary | ICD-10-CM | POA: Diagnosis present

## 2017-07-02 DIAGNOSIS — S82832A Other fracture of upper and lower end of left fibula, initial encounter for closed fracture: Secondary | ICD-10-CM | POA: Diagnosis not present

## 2017-07-02 LAB — LACTIC ACID, PLASMA
LACTIC ACID, VENOUS: 1.2 mmol/L (ref 0.5–1.9)
LACTIC ACID, VENOUS: 1.7 mmol/L (ref 0.5–1.9)
LACTIC ACID, VENOUS: 0.8 mmol/L (ref 0.5–1.9)
Lactic Acid, Venous: 1.2 mmol/L (ref 0.5–1.9)

## 2017-07-02 LAB — I-STAT CG4 LACTIC ACID, ED: LACTIC ACID, VENOUS: 0.94 mmol/L (ref 0.5–1.9)

## 2017-07-02 LAB — PROCALCITONIN: PROCALCITONIN: 0.12 ng/mL

## 2017-07-02 LAB — INFLUENZA PANEL BY PCR (TYPE A & B)
Influenza A By PCR: NEGATIVE
Influenza B By PCR: NEGATIVE

## 2017-07-02 LAB — CBG MONITORING, ED: GLUCOSE-CAPILLARY: 106 mg/dL — AB (ref 65–99)

## 2017-07-02 LAB — MRSA PCR SCREENING: MRSA BY PCR: NEGATIVE

## 2017-07-02 LAB — VITAMIN B12: Vitamin B-12: 205 pg/mL (ref 180–914)

## 2017-07-02 MED ORDER — METRONIDAZOLE IN NACL 5-0.79 MG/ML-% IV SOLN
500.0000 mg | Freq: Once | INTRAVENOUS | Status: AC
  Administered 2017-07-02: 500 mg via INTRAVENOUS
  Filled 2017-07-02 (×2): qty 100

## 2017-07-02 MED ORDER — ZOLPIDEM TARTRATE 5 MG PO TABS
5.0000 mg | ORAL_TABLET | Freq: Every evening | ORAL | Status: DC | PRN
  Administered 2017-07-04 – 2017-07-07 (×2): 5 mg via ORAL
  Filled 2017-07-02 (×2): qty 1

## 2017-07-02 MED ORDER — HYDRALAZINE HCL 20 MG/ML IJ SOLN
5.0000 mg | Freq: Four times a day (QID) | INTRAMUSCULAR | Status: DC | PRN
  Administered 2017-07-03 – 2017-07-04 (×3): 5 mg via INTRAVENOUS
  Filled 2017-07-02 (×3): qty 1

## 2017-07-02 MED ORDER — HYDRALAZINE HCL 20 MG/ML IJ SOLN: 5.0000 mg | INTRAMUSCULAR | Status: DC | PRN

## 2017-07-02 MED ORDER — METRONIDAZOLE IN NACL 5-0.79 MG/ML-% IV SOLN
500.0000 mg | Freq: Once | INTRAVENOUS | Status: AC
  Administered 2017-07-02: 500 mg via INTRAVENOUS
  Filled 2017-07-02: qty 100

## 2017-07-02 MED ORDER — IOPAMIDOL (ISOVUE-300) INJECTION 61%
INTRAVENOUS | Status: AC
  Administered 2017-07-02: 75 mL
  Filled 2017-07-02: qty 75

## 2017-07-02 MED ORDER — CIPROFLOXACIN IN D5W 400 MG/200ML IV SOLN
400.0000 mg | Freq: Once | INTRAVENOUS | Status: AC
  Administered 2017-07-02: 400 mg via INTRAVENOUS
  Filled 2017-07-02: qty 200

## 2017-07-02 MED ORDER — ASPIRIN 81 MG PO CHEW
81.0000 mg | CHEWABLE_TABLET | Freq: Every evening | ORAL | Status: DC
  Administered 2017-07-02 – 2017-07-07 (×6): 81 mg via ORAL
  Filled 2017-07-02 (×6): qty 1

## 2017-07-02 MED ORDER — SODIUM CHLORIDE 0.9 % IV SOLN
INTRAVENOUS | Status: DC
  Administered 2017-07-02: 1000 mL via INTRAVENOUS

## 2017-07-02 MED ORDER — FENTANYL CITRATE (PF) 100 MCG/2ML IJ SOLN: 50.0000 ug | Freq: Once | INTRAMUSCULAR | Status: DC

## 2017-07-02 MED ORDER — ONDANSETRON HCL 4 MG/2ML IJ SOLN: 4.0000 mg | Freq: Three times a day (TID) | INTRAMUSCULAR | Status: DC | PRN

## 2017-07-02 MED ORDER — SODIUM CHLORIDE 0.9 % IV SOLN
INTRAVENOUS | Status: DC
  Administered 2017-07-02 (×2): via INTRAVENOUS
  Administered 2017-07-03: 1 mL via INTRAVENOUS
  Administered 2017-07-03: 75 mL/h via INTRAVENOUS
  Administered 2017-07-04: 04:00:00 via INTRAVENOUS

## 2017-07-02 MED ORDER — HEPARIN SODIUM (PORCINE) 5000 UNIT/ML IJ SOLN
5000.0000 [IU] | Freq: Three times a day (TID) | INTRAMUSCULAR | Status: DC
  Administered 2017-07-02 – 2017-07-08 (×20): 5000 [IU] via SUBCUTANEOUS
  Filled 2017-07-02 (×20): qty 1

## 2017-07-02 MED ORDER — CIPROFLOXACIN IN D5W 400 MG/200ML IV SOLN
400.0000 mg | INTRAVENOUS | Status: DC
  Administered 2017-07-03 – 2017-07-05 (×3): 400 mg via INTRAVENOUS
  Filled 2017-07-02 (×3): qty 200

## 2017-07-02 MED ORDER — MESALAMINE 1.2 G PO TBEC
2.4000 g | DELAYED_RELEASE_TABLET | Freq: Every day | ORAL | Status: DC
  Administered 2017-07-02 – 2017-07-08 (×6): 2.4 g via ORAL
  Filled 2017-07-02 (×7): qty 2

## 2017-07-02 MED ORDER — FERROUS SULFATE 325 (65 FE) MG PO TABS
325.0000 mg | ORAL_TABLET | Freq: Every evening | ORAL | Status: DC
  Administered 2017-07-02 – 2017-07-07 (×6): 325 mg via ORAL
  Filled 2017-07-02 (×6): qty 1

## 2017-07-02 MED ORDER — METRONIDAZOLE IN NACL 5-0.79 MG/ML-% IV SOLN: 500.0000 mg | Freq: Three times a day (TID) | INTRAVENOUS | Status: DC

## 2017-07-02 MED ORDER — ACETAMINOPHEN 325 MG PO TABS
650.0000 mg | ORAL_TABLET | Freq: Four times a day (QID) | ORAL | Status: DC | PRN
  Administered 2017-07-02 – 2017-07-08 (×8): 650 mg via ORAL
  Filled 2017-07-02 (×8): qty 2

## 2017-07-02 MED ORDER — HYDROCODONE-ACETAMINOPHEN 5-325 MG PO TABS
1.0000 | ORAL_TABLET | ORAL | Status: DC | PRN
  Administered 2017-07-03 – 2017-07-04 (×3): 1 via ORAL
  Filled 2017-07-02 (×3): qty 1

## 2017-07-02 NOTE — Progress Notes (Signed)
Pharmacy Antibiotic Note  Bryan Ford is a 84 y.o. male admitted on 07/01/2017 with possible colitis.  Pharmacy has been consulted for cipro dosing. Renal insufficiency noted with sCr 1.7 and CrCl 28 ml/min.  Plan: 1) Cipro 475m IV q24 2) Follow renal function, cultures, LOT  Height: 5' 6.14" (168 cm) Weight: 170 lb (77.1 kg) IBW/kg (Calculated) : 64.13  Temp (24hrs), Avg:101.8 F (38.8 C), Min:101.8 F (38.8 C), Max:101.8 F (38.8 C)  Recent Labs  Lab 07/01/17 2128 07/01/17 2222 07/02/17 0142 07/02/17 0530  WBC 9.4  --   --   --   CREATININE 1.70*  --   --   --   LATICACIDVEN  --  1.29 0.94 1.2    Estimated Creatinine Clearance: 28.3 mL/min (A) (by C-G formula based on SCr of 1.7 mg/dL (H)).    No Known Allergies  Antimicrobials this admission: 2/21 Ceftriaxone x 1 2/22 Flagyl x 1 2/22 Cipro >>  Dose adjustments this admission: n/a  Microbiology results: 2/21 blood cx>>  Thank you for allowing pharmacy to be a part of this patient's care.  MDeboraha Sprang2/22/2019 8:11 AM

## 2017-07-02 NOTE — H&P (Addendum)
History and Physical    Bryan Ford DOB: 1925-08-18 DOA: 07/01/2017   PCP: Thressa Sheller, MD   Patient coming from:  Home    Chief Complaint: Acute confusion with falls, diarrhea  HPI: Level 5 caveat due to confusion Bryan Ford is a 84 y.o. male with medical history significant for Ulcerative colitis, GERD, CKD3, AAA, hyperlipidemia, brought to the emergency department for evaluation of acute confusion.  Over the last few days, the patient had not been eating as usual, but had no fever at home, or cough.  As of yesterday, the patient began to show signs of weakness, confusion.  History is obtained by chart, as no family is present at this time.  He fell during that time, injuring his left ankle, but no apparent head injury or other injuries.  Overnight, status was complicated by nonbloody diarrhea.  His wife was taking him to the bathroom, to shower and remove the stools, when he fell again, injuring his left hip and low back, falling backwards.  She brought him back to the bed, and cleaned in the ER, having reported that the patient showed copious diarrhea again.  He also has chronic urine incontinence, but she denies any changes in it.    ED Course: T-max 101.8, pulse is now 69, respirations 16, O2 sats normal at 99.  Weight 170 pounds. On presentation, the patient was febrile with a T-max of 101.8, without leukocytosis.  Initially was called because of sepsis, due to fever and tachycardia, but this is improving with IV fluids.  Suspecting initial UTI, the patient received Rocephin in the ED, however the UA was negative.  Flu and PCR were negative as well.  Because of his history of ulcerative colitis, this diarrhea is suspected to be secondary to flare.  No odor suggestive of C. difficile he is present.  At the time of evaluation, no stools were seen.  CT of the abdomen and pelvis were showing chronic colitis.  He was given Flagyl and Cipro, which are to continue on  admission. Lactic acid and UA are negative  Labs remarkable for normal white count at 9.4, normal platelets 278, bicarb 24, creatinine 1.7, potassium normal at 4.6, sodium 136. Hemoglobin is 9.3. Received Rocephin at the ED, then Flagyl and Cipro suspecting patient to have colitis   Review of Systems: Unable to obtain, due to level 5 caveat due to confusion  Past Medical History:  Diagnosis Date  . AAA (abdominal aortic aneurysm) ([place])   . AAA (abdominal aortic aneurysm) without rupture ([facility])    Abdominal ultrasound showed an increase from 4.1 to 5.3 cm diameter AAA.  Marland Kitchen Borderline hypertension   . Chronic kidney disease, stage 3 ([place])   . ED (erectile dysfunction)   . Esophageal reflux   . Hyperlipidemia   . Pneumonia 08-2013  . Ulcerative colitis ([place])    Takes mesalamine    Past Surgical History:  Procedure Laterality Date  . ABDOMINAL AORTIC ENDOVASCULAR STENT GRAFT N/A 12/16/2012   Procedure: ABDOMINAL AORTIC ENDOVASCULAR STENT GRAFT-GORE;  Surgeon: Serafina Mitchell, MD;  Location: [facility];  Service: Vascular;  Laterality: N/A;  Ultrasound guided  . ESOPHAGOGASTRODUODENOSCOPY N/A 12/26/2013   Procedure: ESOPHAGOGASTRODUODENOSCOPY (EGD);  Surgeon: Winfield Cunas., MD;  Location: [facility];  Service: Endoscopy;  Laterality: N/A;  . KIDNEY STONE SURGERY  1970s  . MANDIBLE FRACTURE SURGERY  college  . SAVORY DILATION N/A 12/26/2013   Procedure: SAVORY DILATION;  Surgeon: Winfield Cunas.,  MD;  Location: [facility];  Service: Endoscopy;  Laterality: N/A;  . TONSILLECTOMY      Social History Social History   Socioeconomic History  . Marital status: Married    Spouse name: Or rub  . Number of children: 3  . Years of education: Not on file  . Highest education level: Not on file  Social Needs  . Financial resource strain: Not on file  . Food insecurity - worry: Not on file  . Food insecurity - inability: Not on file  . Transportation needs - medical: Not on file  .  Transportation needs - non-medical: Not on file  Occupational History  . Occupation: Retired    Fish farm manager: [employer]    Comment:  procurement  Tobacco Use  . Smoking status: Former Smoker    Types: Cigarettes, Cigars    Last attempt to quit: 11/01/1973    Years since quitting: 43.6  . Smokeless tobacco: Never Used  Substance and Sexual Activity  . Alcohol use: Yes    Alcohol/week: 1.0 - 1.5 oz    Types: 2 - 3 Standard drinks or equivalent per week    Comment: occasionally, but not every week  . Drug use: No  . Sexual activity: Not on file  Other Topics Concern  . Not on file  Social History Narrative   7 range of motion. Very is worse on the ER all day, including mowing the lawn. Also plays golf and walks regularly.     No Known Allergies  Family History  Problem Relation Age of Onset  . Cancer Mother       Prior to Admission medications   Medication Sig Start Date End Date Taking? Authorizing Provider  aspirin 81 MG tablet Take 81 mg by mouth every evening.    Yes [provider]  ferrous sulfate 325 (65 FE) MG tablet Take 325 mg by mouth every evening.   Yes [provider]  mesalamine (LIALDA) 1.2 G EC tablet Take 2.4 g by mouth daily with breakfast.    Yes [provider]    Physical Exam:  Vitals:   07/02/17 0400 07/02/17 0500 07/02/17 0700 07/02/17 0800  BP: (!) 144/56 (!) 154/62 (!) 163/63   Pulse: 63 (!) 59 69   Resp: 18 (!) 21 16   Temp:      TempSrc:      SpO2: 97% 99% 99%   Weight:      Height:    5' 6.14" (1.68 m)   Constitutional: NAD,but obviously confused, unable to provide history of participate on exam.  Patient is awake however and is able to verbalize unintelligible words Eyes: PERRL, lids and conjunctivae normal ENMT: Mucous membranes are moist, without exudate or lesions  Neck: normal, supple, no masses, no thyromegaly Respiratory: clear to auscultation bilaterally, no wheezing, no crackles. Normal respiratory  effort  Cardiovascular: Regular rate and rhythm,  murmur, rubs or gallops.  1+ bilateral extremity edema. 2+ pedal pulses. No carotid bruits.  Abdomen: Soft, non tender, No hepatosplenomegaly. Bowel sounds positive.  Musculoskeletal: no clubbing / cyanosis. Moves all extremities Skin: no jaundice, No lesions.  Neurologic: Sensation intact  Strength unable to be performed as the patient is not participating in exam at this time Psychiatric:   Awake, not agitated, however he spells any intelligible words.    Labs on Admission: I have personally reviewed following labs and imaging studies  CBC: Recent Labs  Lab 07/01/17 2128  WBC 9.4  NEUTROABS 8.0*  HGB 9.3*  HCT 28.5*  MCV 87.4  PLT 660    Basic Metabolic Panel: Recent Labs  Lab 07/01/17 2128  NA 136  K 4.6  CL 101  CO2 24  GLUCOSE 115*  BUN 19  CREATININE 1.70*  CALCIUM 8.8*    GFR: Estimated Creatinine Clearance: 28.3 mL/min (A) (by C-G formula based on SCr of 1.7 mg/dL (H)).  Liver Function Tests: Recent Labs  Lab 07/01/17 2128  AST 27  ALT 12*  ALKPHOS 69  BILITOT 0.9  PROT 6.9  ALBUMIN 3.5   No results for input(s): LIPASE, AMYLASE in the last 168 hours. No results for input(s): AMMONIA in the last 168 hours.  Coagulation Profile: No results for input(s): INR, PROTIME in the last 168 hours.  Cardiac Enzymes: No results for input(s): CKTOTAL, CKMB, CKMBINDEX, TROPONINI in the last 168 hours.  BNP (last 3 results) No results for input(s): PROBNP in the last 8760 hours.  HbA1C: No results for input(s): HGBA1C in the last 72 hours.  CBG: Recent Labs  Lab 07/01/17 2131  GLUCAP 106*    Lipid Profile: No results for input(s): CHOL, HDL, LDLCALC, TRIG, CHOLHDL, LDLDIRECT in the last 72 hours.  Thyroid Function Tests: No results for input(s): TSH, T4TOTAL, FREET4, T3FREE, THYROIDAB in the last 72 hours.  Anemia Panel: No results for input(s): VITAMINB12, FOLATE, FERRITIN, TIBC, IRON,  RETICCTPCT in the last 72 hours.  Urine analysis:    Component Value Date/Time   COLORURINE STRAW (A) 07/01/2017 2128   APPEARANCEUR CLEAR 07/01/2017 2128   LABSPEC 1.013 07/01/2017 2128   PHURINE 7.0 07/01/2017 2128   GLUCOSEU NEGATIVE 07/01/2017 2128   HGBUR NEGATIVE 07/01/2017 2128   BILIRUBINUR NEGATIVE 07/01/2017 2128   KETONESUR NEGATIVE 07/01/2017 2128   PROTEINUR NEGATIVE 07/01/2017 2128   UROBILINOGEN 0.2 11/05/2013 2253   NITRITE NEGATIVE 07/01/2017 2128   LEUKOCYTESUR NEGATIVE 07/01/2017 2128    Sepsis Labs: @LABRCNTIP (procalcitonin:4,lacticidven:4) )No results found for this or any previous visit (from the past 240 hour(s)).   Radiological Exams on Admission: Dg Chest 1 View  Result Date: 07/01/2017 CLINICAL DATA:  84 year old male with chest pain. EXAM: CHEST 1 VIEW COMPARISON:  Chest radiograph dated 11/05/2013 FINDINGS: There is mild cardiomegaly. No vascular congestion or edema minimal left lung base atelectatic changes. No focal consolidation, pleural effusion, or pneumothorax. No acute osseous pathology. IMPRESSION: Left lung base densities, likely atelectatic changes no pleural effusion or pneumothorax. Mild cardiomegaly. Electronically Signed   By: Anner Crete M.D.   On: 07/01/2017 23:41   Ct Head Wo Contrast  Result Date: 07/01/2017 CLINICAL DATA:  Altered mental status.  Fell in shower today. EXAM: CT HEAD WITHOUT CONTRAST CT CERVICAL SPINE WITHOUT CONTRAST TECHNIQUE: Multidetector CT imaging of the head and cervical spine was performed following the standard protocol without intravenous contrast. Multiplanar CT image reconstructions of the cervical spine were also generated. COMPARISON:  10/31/2007 head CT FINDINGS: CT HEAD FINDINGS Brain: Mild superficial and moderate to marked central atrophy with marked ventriculomegaly is noted. A moderate degree of chronic appearing small vessel ischemic disease of periventricular white matter is identified.  Age-indeterminate nonhemorrhagic infarct involving the left occipital lobe with loss of cortical-medullary distinction. No midline shift. No effacement of the basal cisterns or fourth ventricle. Vascular: Moderate atherosclerosis of the carotid siphons. No hyperdense vessels. Skull: No skull fracture. Sinuses/Orbits: Mild ethmoid and moderate right maxillary sinus mucosal thickening. Trace air-fluid level in the right maxillary sinus may represent an acute on chronic sinusitis. Right mastoid  effusion with opacification noted. Left mastoid is clear. Bilateral lens replacements. Other: No significant scalp contusion or hematoma noted. CT CERVICAL SPINE FINDINGS Alignment: Maintained cervical lordosis. Skull base and vertebrae: Osteoarthritis and remodeling across the atlantodental interval. Calcification of the transverse ligament about the odontoid PEG. The clivus abuts the superior margin of the anterior arch of C1 on the lateral view. No acute vertebral fracture. Soft tissues and spinal canal: No prevertebral fluid or swelling. No visible canal hematoma. Disc levels: Marked disc space narrowing C6-7. No significant central or neural foraminal encroachment. Multilevel degenerative facet arthropathy. Upper chest: Negative. Other: None IMPRESSION: 1. Mild superficial and marked central atrophy with moderate degree of chronic appearing small vessel ischemic disease. 2. Slight loss of cortical-medullary distinction in the left occipital lobe without hemorrhage. This represents an age-indeterminate infarct. 3. No acute cervical spine fracture. No listhesis. Marked disc space narrowing C6-7. Electronically Signed   By: Ashley Royalty M.D.   On: 07/01/2017 23:45   Ct Cervical Spine Wo Contrast  Result Date: 07/01/2017 CLINICAL DATA:  Altered mental status.  Fell in shower today. EXAM: CT HEAD WITHOUT CONTRAST CT CERVICAL SPINE WITHOUT CONTRAST TECHNIQUE: Multidetector CT imaging of the head and cervical spine was  performed following the standard protocol without intravenous contrast. Multiplanar CT image reconstructions of the cervical spine were also generated. COMPARISON:  10/31/2007 head CT FINDINGS: CT HEAD FINDINGS Brain: Mild superficial and moderate to marked central atrophy with marked ventriculomegaly is noted. A moderate degree of chronic appearing small vessel ischemic disease of periventricular white matter is identified. Age-indeterminate nonhemorrhagic infarct involving the left occipital lobe with loss of cortical-medullary distinction. No midline shift. No effacement of the basal cisterns or fourth ventricle. Vascular: Moderate atherosclerosis of the carotid siphons. No hyperdense vessels. Skull: No skull fracture. Sinuses/Orbits: Mild ethmoid and moderate right maxillary sinus mucosal thickening. Trace air-fluid level in the right maxillary sinus may represent an acute on chronic sinusitis. Right mastoid effusion with opacification noted. Left mastoid is clear. Bilateral lens replacements. Other: No significant scalp contusion or hematoma noted. CT CERVICAL SPINE FINDINGS Alignment: Maintained cervical lordosis. Skull base and vertebrae: Osteoarthritis and remodeling across the atlantodental interval. Calcification of the transverse ligament about the odontoid PEG. The clivus abuts the superior margin of the anterior arch of C1 on the lateral view. No acute vertebral fracture. Soft tissues and spinal canal: No prevertebral fluid or swelling. No visible canal hematoma. Disc levels: Marked disc space narrowing C6-7. No significant central or neural foraminal encroachment. Multilevel degenerative facet arthropathy. Upper chest: Negative. Other: None IMPRESSION: 1. Mild superficial and marked central atrophy with moderate degree of chronic appearing small vessel ischemic disease. 2. Slight loss of cortical-medullary distinction in the left occipital lobe without hemorrhage. This represents an age-indeterminate  infarct. 3. No acute cervical spine fracture. No listhesis. Marked disc space narrowing C6-7. Electronically Signed   By: Ashley Royalty M.D.   On: 07/01/2017 23:45   Ct Abdomen Pelvis W Contrast  Result Date: 07/02/2017 CLINICAL DATA:  84 year old male with abdominal pain and diarrhea. EXAM: CT ABDOMEN AND PELVIS WITH CONTRAST TECHNIQUE: Multidetector CT imaging of the abdomen and pelvis was performed using the standard protocol following bolus administration of intravenous contrast. CONTRAST:  ISOVUE-300 IOPAMIDOL (ISOVUE-300) INJECTION 61% COMPARISON:  Abdominal CT dated 11/11/2015 FINDINGS: Lower chest: Partially visualized probable trace pleural effusions versus pleural thickening. There are minimal bibasilar atelectasis and interstitial coarsening. Mild cardiomegaly. No intra-abdominal free air or free fluid. Hepatobiliary: Apparent fatty infiltration of the  liver. No intrahepatic biliary ductal dilatation. Small gallstones. No pericholecystic fluid. Pancreas: Unremarkable. No pancreatic ductal dilatation or surrounding inflammatory changes. Spleen: The spleen is unremarkable. Small amount of fluid noted adjacent to the spleen. Adrenals/Urinary Tract: The adrenal glands are unremarkable. Probable bilateral renal cysts. There is no hydronephrosis on either side. There is symmetric enhancement and excretion of contrast by both kidneys. The visualized ureters and urinary bladder appear unremarkable Stomach/Bowel: There is sigmoid diverticulosis without active inflammatory changes. No bowel obstruction. There is a small hiatal hernia. The appendix is not visualized, likely surgically absent. Vascular/Lymphatic: There is atherosclerotic calcification of the aorta. An aorto bi-iliac endovascular stent graft repair is seen. The stent appears patent. Evaluation of the stent is however limited due to suboptimal opacification and timing of the contrast. High attenuating content outside of the confines of the stent  within the excluded aneurysmal sac noted concerning for endoleak. Evaluation is however limited as precontrast images are not obtained. The aneurysm sac measures 5.0 x 5.2 cm in greatest axial dimensions similar to prior CT. No evidence of rupture. Reproductive: The prostate and seminal vesicles are grossly unremarkable. Other: None Musculoskeletal: Degenerative changes of the spine. No acute osseous pathology. IMPRESSION: 1. Colonic diverticulosis. No bowel obstruction or active inflammation. 2. Cholelithiasis. 3. Small amount of free fluid adjacent to the spleen of indeterminate etiology. Clinical correlation is recommended. 4. Small hiatal hernia. 5. Endovascular stent graft repair of infrarenal abdominal aortic aneurysm with findings concerning for an endoleak. No significant interval increase in the size of the excluded aneurysmal sac since the prior CT. No evidence of rupture. Electronically Signed   By: Anner Crete M.D.   On: 07/02/2017 01:16   Dg Hip Unilat With Pelvis 2-3 Views Left  Result Date: 07/01/2017 CLINICAL DATA:  Fall with hip pain EXAM: DG HIP (WITH OR WITHOUT PELVIS) 2-3V LEFT COMPARISON:  CT 11/11/2015 FINDINGS: Partially visualized aorto iliac stent. SI joints are patent bilaterally. Pubic symphysis and rami are intact. No definite acute displaced fracture or malalignment is seen. Joint space is maintained IMPRESSION: No definite acute osseous abnormality. Electronically Signed   By: Donavan Foil M.D.   On: 07/01/2017 23:41    EKG: Independently reviewed.  Assessment/Plan Principal Problem:   Fall Active Problems:   Acute metabolic encephalopathy   Ulcerative colitis ([place])   AAA (abdominal aortic aneurysm) ([facility])   Hypertension   PAD (peripheral artery disease) ([facility])   Dyslipidemia, goal LDL below 70   Lung mass   CKD (chronic kidney disease), stage III ([facility])   GERD (gastroesophageal reflux disease)   Anemia of chronic disease    Acute encephalopathy with falls   Etiology unclear, but likely due to dehydration from acute diarrhea   No seizures or CVA are suspected On presentation, the patient was febrile with a T-max of 101.8, without leukocytosis,(fever likely due to colitis flare).  Initially was called because of sepsis, due to fever and tachycardia, but this is improving with IV fluids.  Suspecting initial UTI, the patient received Rocephin in the ED, however the UA was negative.  Flu and PCR were negative as well.  Because of his history of ulcerative colitis, this diarrhea is suspected to be secondary to flare.  No odor suggestive of C. difficile he is present.  At the time of evaluation, no stools were seen.  CT of the abdomen and pelvis were showing chronic colitis.  He was given Flagyl and Cipro, which are to continue on admission. Lactic acid negative, Procal  0.12  IP telemetry  Vitamin B-12   Received 2.5 L, will continue at 75 cc/h for now due to CKD 3  Continue Cipro and Flagyl  PT/OT  Diarrhea likely to bout of Ulcerative colitis, patient has several episodes of nonbloody diarrhea prior to admission.  He takes mesalamine at home.  Fever likely due to inflammatory response from U.C  Stool culture Cipro/Flagyl N.p.o. for now, and to the patient's acute confusion improves, then clear liquid diet and advance as tolerated Continue IV fluids as above  Check CBC and BMET in am Antiemetics  GI consult if symptoms do not improve  Continue mesalamine   Chronic kidney disease stage  3   baseline creatinine 1.3-1.4, now at 1.7    Lab Results  Component Value Date   CREATININE 1.70 (H) 07/01/2017   CREATININE 1.41 (H) 11/07/2015   CREATININE 1.39 (H) 03/15/2014  IVF Repeat BMET in am  No  NSAIDS  Anemia of chronic disease Hemoglobin on admission 9.3 at baseline   Repeat CBC in am  No transfusion is indicated at this time Continue Iron supplements   GERD, no acute symptoms, not on meds at home      Hypertension BP (!) 163/63   Pulse 69    Temp (S) (!) 101.8 F (38.8 C) (Rectal) Comment: MD Eulis Foster made aware  Resp 16   Ht 5' 6.14" (1.68 m)   Wt 77.1 kg (170 lb)   SpO2 99%   BMI 27.32 kg/m  Not on meds at home Hydralazine Q6 hours as needed for BP 160/90    DVT prophylaxis:  Heparin  Code Status:    Full  Family Communication, none  Disposition Plan: Expect patient to be discharged to home after condition improves Consults called:    None  Admission status: IP tel e   Sharene Butters, PA-C [facility]   Amion text  [phone]   07/02/2017, 8:42 AM

## 2017-07-02 NOTE — Care Management (Signed)
This is a no charge note  Pending admission per PA, Rob  84 year old male with a past medical history of hyperlipidemia, ulcerative colitis, GERD, CKD-3, AAA, anemia, CKD-III, who presents with confusion and fall. CT head and C-spine negative. Left hip x-rays negative for fracture.   Found to have fever, but no leukocytosis. Patient meets criteria criteria for sepsis (fever and tachycardia). Source of infection is not clear. Initially though that patient could have UTI, and one dose of Rocephin was given in ED, but urinalysis is negative. Flu PCR negative. Patient has diarrhea indicating possible colitis given history of ulcerative colitis. CT abdomen/pelvis is not impressive. Patient was started with Cipro plus Flagyl. Patient is admitted to telemetry bed as inpatient.   Ivor Costa, MD  [facility] Pager [phone]  If 7PM-7AM, please contact night-coverage [email or web address] Password [facility] 07/02/2017, 2:27 AM

## 2017-07-02 NOTE — ED Provider Notes (Signed)
Patient signed out to me.  Patient with AMS, fever, and falls.  Originally thought to be UTI, but UA is inconsistent with UTI.  Will add flu panel.  Also patient has extensive hx of colitis and has had numerous large non-bloody BMs today.  Will check CT abd.  CT is largely unremarkable, however given the patient's several large loose bowel movements tonight, we will treat for colitis.    Appreciate Dr. Blaine Hamper, who will admit.  CRITICAL CARE Performed by: Montine Circle  Multiple reassessments, additional imaging, extensive discussion with family, and consultations. Total critical care time: 41 minutes  Critical care time was exclusive of separately billable procedures and treating other patients.  Critical care was necessary to treat or prevent imminent or life-threatening deterioration.  Critical care was time spent personally by me on the following activities: development of treatment plan with patient and/or surrogate as well as nursing, discussions with consultants, evaluation of patient's response to treatment, examination of patient, obtaining history from patient or surrogate, ordering and performing treatments and interventions, ordering and review of laboratory studies, ordering and review of radiographic studies, pulse oximetry and re-evaluation of patient's condition.    Montine Circle, PA-C 07/02/17 Monico Blitz, MD 07/02/17 1010

## 2017-07-03 ENCOUNTER — Inpatient Hospital Stay (HOSPITAL_COMMUNITY): Payer: Medicare Other

## 2017-07-03 DIAGNOSIS — N183 Chronic kidney disease, stage 3 unspecified: Secondary | ICD-10-CM | POA: Diagnosis present

## 2017-07-03 LAB — BASIC METABOLIC PANEL
ANION GAP: 14 (ref 5–15)
BUN: 23 mg/dL — ABNORMAL HIGH (ref 6–20)
CALCIUM: 8.3 mg/dL — AB (ref 8.9–10.3)
CO2: 18 mmol/L — AB (ref 22–32)
CREATININE: 1.74 mg/dL — AB (ref 0.61–1.24)
Chloride: 108 mmol/L (ref 101–111)
GFR calc non Af Amer: 33 mL/min — ABNORMAL LOW (ref >60)
GFR, EST AFRICAN AMERICAN: 38 mL/min — AB (ref >60)
Glucose, Bld: 82 mg/dL (ref 65–99)
Potassium: 3.8 mmol/L (ref 3.5–5.1)
SODIUM: 140 mmol/L (ref 135–145)

## 2017-07-03 LAB — URINE CULTURE: Culture: NO GROWTH

## 2017-07-03 LAB — CBC
HCT: 28.2 % — ABNORMAL LOW (ref 39.0–52.0)
HEMOGLOBIN: 8.9 g/dL — AB (ref 13.0–17.0)
MCH: 27.7 pg (ref 26.0–34.0)
MCHC: 31.6 g/dL (ref 30.0–36.0)
MCV: 87.9 fL (ref 78.0–100.0)
PLATELETS: 225 10*3/uL (ref 150–400)
RBC: 3.21 MIL/uL — AB (ref 4.22–5.81)
RDW: 15.2 % (ref 11.5–15.5)
WBC: 6.6 10*3/uL (ref 4.0–10.5)

## 2017-07-03 MED ORDER — IPRATROPIUM-ALBUTEROL 0.5-2.5 (3) MG/3ML IN SOLN
3.0000 mL | Freq: Four times a day (QID) | RESPIRATORY_TRACT | Status: DC | PRN
  Administered 2017-07-03 – 2017-07-05 (×2): 3 mL via RESPIRATORY_TRACT
  Filled 2017-07-03 (×2): qty 3

## 2017-07-03 MED ORDER — BENZONATATE 100 MG PO CAPS
100.0000 mg | ORAL_CAPSULE | Freq: Three times a day (TID) | ORAL | Status: DC | PRN
  Administered 2017-07-03 – 2017-07-06 (×3): 100 mg via ORAL
  Filled 2017-07-03 (×3): qty 1

## 2017-07-03 NOTE — Clinical Social Work Note (Signed)
Clinical Social Work Assessment  Patient Details  Name: Bryan Ford MRN: [id number] Date of Birth: [birth year]  Date of referral:  07/03/17               Reason for consult:  Facility Placement, [employer], Discharge Planning                Permission sought to share information with:  Facility Sport and exercise psychologist, Family Supports Permission granted to share information::  No(pt only oriented to self)  Name::     [name]  Agency::  SNFs (desire for [facility] or [facility])  Relationship::  wife  Contact Information:     Housing/Transportation Living arrangements for the past 2 months:  [place] of Information:  Adult Children, Patient Patient Interpreter Needed:  None Criminal Activity/Legal Involvement Pertinent to Current Situation/Hospitalization:  No - Comment as needed Significant Relationships:  Adult Children, Warehouse manager, Spouse Lives with:  Spouse Do you feel safe going back to the place where you live?  Yes Need for family participation in patient care:  Yes (Comment)  Care giving concerns: Pt wife concerned with current fatigue and therapy needs that she could not manage pt at home. Pt wife would like SNF and is amenable to PT recommendations.    Social Worker assessment / plan:  CSW met with pt wife and three children at bedside, pt was asleep and has only been oriented to person today. Pt wife is worried about current therapy needs, and agrees with PT recommendation of SNF before pt comes back home. Pt children also amenable, one of the pt's daughters is familiar with SNF process. Pt and pt wife live in the [place] area and would like [facility] options. CSW will complete SNF faxing out and f/u with bed offers. Pt will need insurance authorization.   Employment status:  Retired Nurse, adult PT Recommendations:  [place] / Referral to community resources:   [place]  Patient/Family's Response to care: Pt wife and pt children understand CSW role and were grateful for support with SNF referral process.   Patient/Family's Understanding of and Emotional Response to Diagnosis, Current Treatment, and Prognosis:  Pt family understand pt current diagnosis, treatment, and prognosis. Pt wife would like treatment to happen closer to home, and was very worried appearing during assessment, this was also apparent in her rushed speech with CSW, calming down later in assessment. CSW provided brief emotional support and helped pt family understand SNF process.   Emotional Assessment Appearance:  Appears stated age Attitude/Demeanor/Rapport:  Unable to Assess Affect (typically observed):  Unable to Assess Orientation:  Oriented to Self Alcohol / Substance use:  Not Applicable Psych involvement (Current and /or in the community):  No (Comment)  Discharge Needs  Concerns to be addressed:  Discharge Planning Concerns, Care Coordination Readmission within the last 30 days:  No Current discharge risk:  Dependent with Mobility Barriers to Discharge:  Continued Medical Work up, [employer], [place] 07/03/2017, 1:23 PM

## 2017-07-03 NOTE — [place] FL2 (Signed)
[place] MEDICAID FL2 LEVEL OF CARE SCREENING TOOL     IDENTIFICATION  Patient Name: Bryan Ford Birthdate: Sep 12, 1925 Sex: male Admission Date (Current Location): 07/01/2017  [facility] and [place] Number:  Herbalist and Address:  [facility] [place]. [facility], [place] [address], [place],  [address]      Provider Number: [id number]  Attending Physician Name and Address:  Jani Gravel, MD  Relative Name and Phone Number:  Rosario Kushner [phone]    Current Level of Care: Hospital Recommended Level of Care: [place] Prior Approval Number:    Date Approved/Denied:   PASRR Number:   [id number]  Discharge Plan: SNF    Current Diagnoses: Patient Active Problem List   Diagnosis Date Noted  . Fall 07/02/2017  . Acute metabolic encephalopathy 71/24/5809  . Ulcerative colitis ([place]) 07/02/2017  . CKD (chronic kidney disease), stage III ([place]) 07/02/2017  . GERD (gastroesophageal reflux disease) 07/02/2017  . Anemia of chronic disease 07/02/2017  . AAA (abdominal aortic aneurysm) without rupture ([place]) 03/13/2013  . Lung mass 03/13/2013  . Back pain 12/23/2012  . Abdominal aneurysm without mention of rupture 11/21/2012  . AAA (abdominal aortic aneurysm) ([place]) 11/07/2012  . Hypertension 11/07/2012  . PAD (peripheral artery disease) ([place]) 11/07/2012  . Dyslipidemia, goal LDL below 70 11/07/2012    Orientation RESPIRATION BLADDER Height & Weight     Self, Time, Place, Situation  Normal Incontinent, External catheter Weight: 165 lb 5.5 oz (75 kg) Height:  5' 6"  (167.6 cm)  BEHAVIORAL SYMPTOMS/MOOD NEUROLOGICAL BOWEL NUTRITION STATUS      Incontinent Diet(see discharge summary)  AMBULATORY STATUS COMMUNICATION OF NEEDS Skin   Extensive Assist Verbally Normal                       Personal Care Assistance Level of Assistance  Bathing, Feeding, Dressing Bathing Assistance: Limited assistance Feeding assistance:  Independent Dressing Assistance: Limited assistance     Functional Limitations Info  Sight, Hearing, Speech Sight Info: Adequate Hearing Info: Adequate Speech Info: Adequate    SPECIAL CARE FACTORS FREQUENCY  PT (By licensed PT), OT (By licensed OT)     PT Frequency: 2x week OT Frequency: 2x week            Contractures Contractures Info: Not present    Additional Factors Info  Code Status, Allergies Code Status Info: Full Code Allergies Info: No Known Allergies           Current Medications (07/03/2017):  This is the current hospital active medication list Current Facility-Administered Medications  Medication Dose Route Frequency Provider Last Rate Last Dose  . 0.9 %  sodium chloride infusion   Intravenous Continuous Rondel Jumbo, PA-C 75 mL/hr at 07/03/17 0131 75 mL/hr at 07/03/17 0131  . acetaminophen (TYLENOL) tablet 650 mg  650 mg Oral Q6H PRN Ivor Costa, MD   650 mg at 07/02/17 1236  . aspirin chewable tablet 81 mg  81 mg Oral QPM Ivor Costa, MD   81 mg at 07/02/17 1647  . ciprofloxacin (CIPRO) IVPB 400 mg  400 mg Intravenous Q24H Otilio Miu, RPH 200 mL/hr at 07/03/17 0544 400 mg at 07/03/17 0544  . fentaNYL (SUBLIMAZE) injection 50 mcg  50 mcg Intravenous Once Montine Circle, PA-C      . ferrous sulfate tablet 325 mg  325 mg Oral QPM Ivor Costa, MD   325 mg at 07/02/17 1647  . heparin injection 5,000 Units  5,000 Units Subcutaneous Q8H Rondel Jumbo, PA-C   5,000 Units at 07/03/17 0981  . hydrALAZINE (APRESOLINE) injection 5 mg  5 mg Intravenous Q6H PRN Rondel Jumbo, PA-C   5 mg at 07/03/17 0601  . HYDROcodone-acetaminophen (NORCO/VICODIN) 5-325 MG per tablet 1-2 tablet  1-2 tablet Oral Q4H PRN Rondel Jumbo, PA-C   1 tablet at 07/03/17 0604  . mesalamine (LIALDA) EC tablet 2.4 g  2.4 g Oral Q breakfast Ivor Costa, MD   2.4 g at 07/03/17 0853  . ondansetron (ZOFRAN) injection 4 mg  4 mg Intravenous Q8H PRN Ivor Costa, MD      . zolpidem (AMBIEN)  tablet 5 mg  5 mg Oral QHS PRN Ivor Costa, MD         Discharge Medications: Please see discharge summary for a list of discharge medications.  Relevant Imaging Results:  Relevant Lab Results:   Additional Information SS# [place], [place]

## 2017-07-03 NOTE — Progress Notes (Signed)
Pt having productive cough, wheezing, BP 177/105 given hydralazine, incentive spirometer, informed Dr. Jani Gravel, waiting for new order.

## 2017-07-03 NOTE — Progress Notes (Signed)
Physical Therapy Evaluation Patient Details Name: Bryan Ford MRN: [id number] DOB: 08/11/1925 Today's Date: 07/03/2017   History of Present Illness  [name] is a 84 y/o male admitted on 07/01/17 due to acute confusion and diarrhea with multiple falls within the home without injury. Patient with a PMH significant for ulcerative colitis, GERD, CKD3, AAA.  Clinical Impression  Patient admitted with the above listed diagnosis. Patients wife and daughter present for PT session with wife providing patient history and PLOF. PTA, patient was independent with ADLs and ambulation without AD. Today, Patient requiring MOD A for all bed mobility and transfers with heavy VC for hand placement prior to sit to stand for overall safety and efficiency of transfer. Able to stand bedside with RW 2 x 30-45 sec with fatigue limiting. Will continue to follow acutely to maximize functional mobility prior to d/c.     Follow Up Recommendations SNF    Equipment Recommendations  Other (comment)(will assess at next venue of care vs. as patient progresses)    Recommendations for Other Services OT consult     Precautions / Restrictions Precautions Precautions: Fall Restrictions Weight Bearing Restrictions: No      Mobility  Bed Mobility Overal bed mobility: Needs Assistance Bed Mobility: Supine to Sit;Sit to Supine     Supine to sit: Mod assist Sit to supine: Mod assist   General bed mobility comments: Mod A for LE management adn trunk control with transfers  Transfers Overall transfer level: Needs assistance Equipment used: Rolling walker (2 wheeled) Transfers: Sit to/from Stand Sit to Stand: Mod assist         General transfer comment: heavy VC for hand placement during transfer; height of bed elevated  Ambulation/Gait                Stairs            Wheelchair Mobility    Modified Rankin (Stroke Patients Only)       Balance Overall balance assessment: Needs  assistance Sitting-balance support: Bilateral upper extremity supported;Feet supported Sitting balance-Leahy Scale: Fair     Standing balance support: Bilateral upper extremity supported Standing balance-Leahy Scale: Poor                               Pertinent Vitals/Pain Pain Assessment: Faces Faces Pain Scale: Hurts even more Pain Location: lower abdomen, L hip Pain Descriptors / Indicators: Grimacing;Guarding;Moaning Pain Intervention(s): Limited activity within patient's tolerance;Monitored during session;Repositioned    Home Living Family/patient expects to be discharged to:: Private residence Living Arrangements: Spouse/significant other Available Help at Discharge: Family Type of Home: House Home Access: Stairs to enter Entrance Stairs-Rails: Right Entrance Stairs-Number of Steps: 3 Home Layout: Two level;Able to live on main level with bedroom/bathroom        Prior Function Level of Independence: Independent         Comments: wife providing PLOF - independent without AD, lived on 2nd story, enjoyed working outdoors     Journalist, newspaper        Extremity/Trunk Assessment   Upper Extremity Assessment Upper Extremity Assessment: Defer to OT evaluation    Lower Extremity Assessment Lower Extremity Assessment: Generalized weakness       Communication   Communication: HOH  Cognition Arousal/Alertness: Awake/alert Behavior During Therapy: WFL for tasks assessed/performed Overall Cognitive Status: Within Functional Limits for tasks assessed  General Comments      Exercises     Assessment/Plan    PT Assessment Patient needs continued PT services  PT Problem List Decreased strength;Decreased balance;Decreased activity tolerance;Decreased mobility;Decreased knowledge of use of DME;Decreased safety awareness       PT Treatment Interventions DME instruction;Gait training;Stair  training;Functional mobility training;Therapeutic activities;Therapeutic exercise;Balance training;Patient/family education    PT Goals (Current goals can be found in the Care Plan section)  Acute Rehab PT Goals Patient Stated Goal: family would like patient to regain strength to transfer and walk PT Goal Formulation: With patient/family Time For Goal Achievement: 07/17/17 Potential to Achieve Goals: Good    Frequency Min 2X/week   Barriers to discharge Inaccessible home environment;Decreased caregiver support elderly patient with elderly wife. 3 steps to enter home. Only half bath on lower level.    Co-evaluation               AM-PAC PT "6 Clicks" Daily Activity  Outcome Measure Difficulty turning over in bed (including adjusting bedclothes, sheets and blankets)?: Unable Difficulty moving from lying on back to sitting on the side of the bed? : Unable Difficulty sitting down on and standing up from a chair with arms (e.g., wheelchair, bedside commode, etc,.)?: Unable Help needed moving to and from a bed to chair (including a wheelchair)?: A Lot Help needed walking in hospital room?: A Lot Help needed climbing 3-5 steps with a railing? : Total 6 Click Score: 8    End of Session Equipment Utilized During Treatment: Gait belt Activity Tolerance: Patient tolerated treatment well Patient left: in bed;with call bell/phone within reach;with family/visitor present Nurse Communication: Mobility status PT Visit Diagnosis: Unsteadiness on feet (R26.81);Other abnormalities of gait and mobility (R26.89);Muscle weakness (generalized) (M62.81);History of falling (Z91.81);Difficulty in walking, not elsewhere classified (R26.2)    Time: 9702-6378 PT Time Calculation (min) (ACUTE ONLY): 37 min   Charges:   PT Evaluation $PT Eval Moderate Complexity: 1 Mod PT Treatments $Therapeutic Activity: 8-22 mins   PT G Codes:        Lanney Gins, PT, DPT 07/03/17 10:34 AM

## 2017-07-03 NOTE — Progress Notes (Signed)
Patient ID: DENIRO LAYMON, male   DOB: Sep 23, 1925, 84 y.o.   MRN: [id number]                                                                PROGRESS NOTE                                                                                                                                                                                                             Patient Demographics:    Marrio Scribner, is a 84 y.o. male, DOB - August 17, 1925, SJG:[id number]  Admit date - 07/01/2017   Admitting Physician Ivor Costa, MD  Outpatient Primary MD for the patient is Deland Pretty, MD  LOS - 1  Outpatient Specialists:     Chief Complaint  Patient presents with  . Fall  . Altered Mental Status       Brief Narrative   84 y.o. male with medical history significant for Ulcerative colitis, GERD, CKD3, AAA, hyperlipidemia, brought to the emergency department for evaluation of acute confusion.  Over the last few days, the patient had not been eating as usual, but had no fever at home, or cough.  As of yesterday, the patient began to show signs of weakness, confusion.  History is obtained by chart, as no family is present at this time.  He fell during that time, injuring his left ankle, but no apparent head injury or other injuries.  Overnight, status was complicated by nonbloody diarrhea.  His wife was taking him to the bathroom, to shower and remove the stools, when he fell again, injuring his left hip and low back, falling backwards.  She brought him back to the bed, and cleaned in the ER, having reported that the patient showed copious diarrhea again.  He also has chronic urine incontinence, but she denies any changes in it.    ED Course: T-max 101.8, pulse is now 69, respirations 16, O2 sats normal at 99.  Weight 170 pounds. On presentation, the patient was febrile with a T-max of 101.8, without leukocytosis.  Initially was called because of sepsis, due to fever and tachycardia, but this is improving with IV fluids.   Suspecting initial UTI, the patient received Rocephin in the ED, however the UA was negative.  Flu and PCR were negative as  well.  Because of his history of ulcerative colitis, this diarrhea is suspected to be secondary to flare.  No odor suggestive of C. difficile he is present.  At the time of evaluation, no stools were seen.  CT of the abdomen and pelvis were showing chronic colitis.  He was given Flagyl and Cipro, which are to continue on admission. Lactic acid and UA are negative  Labs remarkable for normal white count at 9.4, normal platelets 278, bicarb 24, creatinine 1.7, potassium normal at 4.6, sodium 136. Hemoglobin is 9.3. Received Rocephin at the ED, then Flagyl and Cipro suspecting patient to have colitis      Subjective:    [name] today has states that diarrhea has improved. pt appears to have had low grade temp over the past 24 hours.  Denies fever presently.  Denies n/v, constipation, brbpr.   Per family PT recommended SNF for rehab, generally still weak.   No headache, No chest pain No Cough - SOB. No Dysuria.    Assessment  & Plan :    Principal Problem:   Ulcerative colitis ([place]) Active Problems:   AAA (abdominal aortic aneurysm) (HCC)   Hypertension   PAD (peripheral artery disease) (HCC)   Dyslipidemia, goal LDL below 70   Lung mass   Fall   Acute metabolic encephalopathy   CKD (chronic kidney disease), stage III (HCC)   GERD (gastroesophageal reflux disease)   Anemia of chronic disease     Acute encephalopathy with falls  Etiology unclear, but likely due to dehydration from acute diarrhea   No seizures or CVA are suspected On presentation, the patient was febrile with a T-max of 101.8, without leukocytosis,(fever likely due to colitis flare).  Initially was called because of sepsis, due to fever and tachycardia, but this is improving with IV fluids.  Suspecting initial UTI, the patient received Rocephin in the ED, however the UA was negative.  Flu and  PCR were negative as well.  Because of his history of ulcerative colitis, this diarrhea is suspected to be secondary to flare.  No odor suggestive of C. difficile he is present.  At the time of evaluation, no stools were seen.  CT of the abdomen and pelvis were showing chronic colitis.  He was given Flagyl and Cipro, which are to continue on admission. Lactic acid negative, Procal 0.12  IP telemetry  Vitamin B-12   Received 2.5 L, will continue at 75 cc/h for now due to CKD 3  Continue Cipro and Flagyl  PT/OT  Diarrhea likely to bout of Ulcerative colitis, patient has several episodes of nonbloody diarrhea prior to admission.  He takes mesalamine at home.  Fever likely due to inflammatory response from U.C  Stool culture Cipro/Flagyl N.p.o. for now, and to the patient's acute confusion improves, then clear liquid diet and advance as tolerated Continue IV fluids as above  Check CBC and BMET in am Antiemetics  GI consult if symptoms do not improve  Continue mesalamine Will try on clears and try to advance diet   Chronic kidney disease stage  3   baseline creatinine 1.3-1.4, now at 1.7    RecentLabs       Lab Results  Component Value Date   CREATININE 1.70 (H) 07/01/2017   CREATININE 1.41 (H) 11/07/2015   CREATININE 1.39 (H) 03/15/2014    IVF Repeat BMET in am  No  NSAIDS  Anemia of chronic disease Hemoglobin on admission 9.3 at baseline   Repeat CBC in am  No transfusion is indicated at this time Continue Iron supplements   GERD, no acute symptoms, not on meds at home       Code Status : FULL CODE  Family Communication  : w wife  Disposition Plan  : SNF  Barriers For Discharge :   Consults  :  PT/OT, social work  Procedures  :    DVT Prophylaxis  :  - SCDs   Lab Results  Component Value Date   PLT 225 07/03/2017    Antibiotics  :  Cipro, flagyll  Anti-infectives (From admission, onward)   Start     Dose/Rate Route Frequency Ordered Stop    07/03/17 0500  ciprofloxacin (CIPRO) IVPB 400 mg     400 mg 200 mL/hr over 60 Minutes Intravenous Every 24 hours 07/02/17 0808     07/02/17 1300  metroNIDAZOLE (FLAGYL) IVPB 500 mg     500 mg 100 mL/hr over 60 Minutes Intravenous  Once 07/02/17 0752 07/02/17 1318   07/02/17 0800  metroNIDAZOLE (FLAGYL) IVPB 500 mg  Status:  Discontinued     500 mg 100 mL/hr over 60 Minutes Intravenous Every 8 hours 07/02/17 0752 07/02/17 0752   07/02/17 0230  ciprofloxacin (CIPRO) IVPB 400 mg     400 mg 200 mL/hr over 60 Minutes Intravenous  Once 07/02/17 0224 07/02/17 0713   07/02/17 0230  metroNIDAZOLE (FLAGYL) IVPB 500 mg     500 mg 100 mL/hr over 60 Minutes Intravenous  Once 07/02/17 0224 07/02/17 0712   07/01/17 2200  cefTRIAXone (ROCEPHIN) 2 g in sodium chloride 0.9 % 100 mL IVPB     2 g 200 mL/hr over 30 Minutes Intravenous  Once 07/01/17 2155 07/01/17 2247        Objective:   Vitals:   07/02/17 1433 07/02/17 1446 07/02/17 2127 07/03/17 0456  BP: (!) 138/101 135/61 (!) 149/61 (!) 180/83  Pulse: 60  64 66  Resp: 18  18 17   Temp: 99 F (37.2 C)  98.1 F (36.7 C) 97.9 F (36.6 C)  TempSrc: Axillary  Oral Oral  SpO2: 100%  98% 97%  Weight:      Height:        Wt Readings from Last 3 Encounters:  07/02/17 75 kg (165 lb 5.5 oz)  12/21/16 73 kg (161 lb)  12/16/15 72.6 kg (160 lb)     Intake/Output Summary (Last 24 hours) at 07/03/2017 1032 Last data filed at 07/03/2017 0500 Gross per 24 hour  Intake 0 ml  Output 1050 ml  Net -1050 ml     Physical Exam  Awake Alert, Oriented X 2, No new F.N deficits, Normal affect [place].AT,PERRAL Supple Neck,No JVD, No cervical lymphadenopathy appriciated.  Symmetrical Chest wall movement, Good air movement bilaterally, CTAB RRR,No Gallops,Rubs or new Murmurs, No Parasternal Heave +ve B.Sounds, Abd Soft, No tenderness, No organomegaly appriciated, No rebound - guarding or rigidity. No Cyanosis, Clubbing or edema, No new Rash or bruise      Data Review:    CBC Recent Labs  Lab 07/01/17 2128 07/03/17 0551  WBC 9.4 6.6  HGB 9.3* 8.9*  HCT 28.5* 28.2*  PLT 278 225  MCV 87.4 87.9  MCH 28.5 27.7  MCHC 32.6 31.6  RDW 15.2 15.2  LYMPHSABS 0.5*  --   MONOABS 0.9  --   EOSABS 0.0  --   BASOSABS 0.0  --     Chemistries  Recent Labs  Lab 07/01/17 2128 07/03/17 0551  NA 136 140  K 4.6 3.8  CL 101 108  CO2 24 18*  GLUCOSE 115* 82  BUN 19 23*  CREATININE 1.70* 1.74*  CALCIUM 8.8* 8.3*  AST 27  --   ALT 12*  --   ALKPHOS 69  --   BILITOT 0.9  --    ------------------------------------------------------------------------------------------------------------------ No results for input(s): CHOL, HDL, LDLCALC, TRIG, CHOLHDL, LDLDIRECT in the last 72 hours.  No results found for: HGBA1C ------------------------------------------------------------------------------------------------------------------ No results for input(s): TSH, T4TOTAL, T3FREE, THYROIDAB in the last 72 hours.  Invalid input(s): FREET3 ------------------------------------------------------------------------------------------------------------------ Recent Labs    07/02/17 0835  VITAMINB12 205    Coagulation profile No results for input(s): INR, PROTIME in the last 168 hours.  No results for input(s): DDIMER in the last 72 hours.  Cardiac Enzymes No results for input(s): CKMB, TROPONINI, MYOGLOBIN in the last 168 hours.  Invalid input(s): CK ------------------------------------------------------------------------------------------------------------------ No results found for: BNP  Inpatient Medications  Scheduled Meds: . aspirin  81 mg Oral QPM  . fentaNYL (SUBLIMAZE) injection  50 mcg Intravenous Once  . ferrous sulfate  325 mg Oral QPM  . heparin  5,000 Units Subcutaneous Q8H  . mesalamine  2.4 g Oral Q breakfast   Continuous Infusions: . sodium chloride 75 mL/hr (07/03/17 0131)  . ciprofloxacin 400 mg (07/03/17 0544)   PRN  Meds:.acetaminophen, hydrALAZINE, HYDROcodone-acetaminophen, ondansetron (ZOFRAN) IV, zolpidem  Micro Results Recent Results (from the past 240 hour(s))  Urine culture     Status: None   Collection Time: 07/01/17  9:45 PM  Result Value Ref Range Status   Specimen Description URINE, CATHETERIZED  Final   Special Requests NONE  Final   Culture   Final    NO GROWTH Performed at [facility], [address], [place], [place] [address]    Report Status 07/03/2017 FINAL  Final  MRSA PCR Screening     Status: None   Collection Time: 07/02/17 11:51 AM  Result Value Ref Range Status   MRSA by PCR NEGATIVE NEGATIVE Final    Comment:        The GeneXpert MRSA Assay (FDA approved for NASAL specimens only), is one component of a comprehensive MRSA colonization surveillance program. It is not intended to diagnose MRSA infection nor to guide or monitor treatment for MRSA infections. Performed at [facility], [place] [address], [place], [place] [address]     Radiology Reports Dg Chest 1 View  Result Date: 07/01/2017 CLINICAL DATA:  84 year old male with chest pain. EXAM: CHEST 1 VIEW COMPARISON:  Chest radiograph dated 11/05/2013 FINDINGS: There is mild cardiomegaly. No vascular congestion or edema minimal left lung base atelectatic changes. No focal consolidation, pleural effusion, or pneumothorax. No acute osseous pathology. IMPRESSION: Left lung base densities, likely atelectatic changes no pleural effusion or pneumothorax. Mild cardiomegaly. Electronically Signed   By: Anner Crete M.D.   On: 07/01/2017 23:41   Ct Head Wo Contrast  Result Date: 07/01/2017 CLINICAL DATA:  Altered mental status.  Fell in shower today. EXAM: CT HEAD WITHOUT CONTRAST CT CERVICAL SPINE WITHOUT CONTRAST TECHNIQUE: Multidetector CT imaging of the head and cervical spine was performed following the standard protocol without intravenous contrast. Multiplanar CT image reconstructions of the cervical  spine were also generated. COMPARISON:  10/31/2007 head CT FINDINGS: CT HEAD FINDINGS Brain: Mild superficial and moderate to marked central atrophy with marked ventriculomegaly is noted. A moderate degree of chronic appearing small vessel ischemic disease of periventricular white matter is identified. Age-indeterminate nonhemorrhagic infarct involving  the left occipital lobe with loss of cortical-medullary distinction. No midline shift. No effacement of the basal cisterns or fourth ventricle. Vascular: Moderate atherosclerosis of the carotid siphons. No hyperdense vessels. Skull: No skull fracture. Sinuses/Orbits: Mild ethmoid and moderate right maxillary sinus mucosal thickening. Trace air-fluid level in the right maxillary sinus may represent an acute on chronic sinusitis. Right mastoid effusion with opacification noted. Left mastoid is clear. Bilateral lens replacements. Other: No significant scalp contusion or hematoma noted. CT CERVICAL SPINE FINDINGS Alignment: Maintained cervical lordosis. Skull base and vertebrae: Osteoarthritis and remodeling across the atlantodental interval. Calcification of the transverse ligament about the odontoid PEG. The clivus abuts the superior margin of the anterior arch of C1 on the lateral view. No acute vertebral fracture. Soft tissues and spinal canal: No prevertebral fluid or swelling. No visible canal hematoma. Disc levels: Marked disc space narrowing C6-7. No significant central or neural foraminal encroachment. Multilevel degenerative facet arthropathy. Upper chest: Negative. Other: None IMPRESSION: 1. Mild superficial and marked central atrophy with moderate degree of chronic appearing small vessel ischemic disease. 2. Slight loss of cortical-medullary distinction in the left occipital lobe without hemorrhage. This represents an age-indeterminate infarct. 3. No acute cervical spine fracture. No listhesis. Marked disc space narrowing C6-7. Electronically Signed   By:  Ashley Royalty M.D.   On: 07/01/2017 23:45   Ct Cervical Spine Wo Contrast  Result Date: 07/01/2017 CLINICAL DATA:  Altered mental status.  Fell in shower today. EXAM: CT HEAD WITHOUT CONTRAST CT CERVICAL SPINE WITHOUT CONTRAST TECHNIQUE: Multidetector CT imaging of the head and cervical spine was performed following the standard protocol without intravenous contrast. Multiplanar CT image reconstructions of the cervical spine were also generated. COMPARISON:  10/31/2007 head CT FINDINGS: CT HEAD FINDINGS Brain: Mild superficial and moderate to marked central atrophy with marked ventriculomegaly is noted. A moderate degree of chronic appearing small vessel ischemic disease of periventricular white matter is identified. Age-indeterminate nonhemorrhagic infarct involving the left occipital lobe with loss of cortical-medullary distinction. No midline shift. No effacement of the basal cisterns or fourth ventricle. Vascular: Moderate atherosclerosis of the carotid siphons. No hyperdense vessels. Skull: No skull fracture. Sinuses/Orbits: Mild ethmoid and moderate right maxillary sinus mucosal thickening. Trace air-fluid level in the right maxillary sinus may represent an acute on chronic sinusitis. Right mastoid effusion with opacification noted. Left mastoid is clear. Bilateral lens replacements. Other: No significant scalp contusion or hematoma noted. CT CERVICAL SPINE FINDINGS Alignment: Maintained cervical lordosis. Skull base and vertebrae: Osteoarthritis and remodeling across the atlantodental interval. Calcification of the transverse ligament about the odontoid PEG. The clivus abuts the superior margin of the anterior arch of C1 on the lateral view. No acute vertebral fracture. Soft tissues and spinal canal: No prevertebral fluid or swelling. No visible canal hematoma. Disc levels: Marked disc space narrowing C6-7. No significant central or neural foraminal encroachment. Multilevel degenerative facet arthropathy.  Upper chest: Negative. Other: None IMPRESSION: 1. Mild superficial and marked central atrophy with moderate degree of chronic appearing small vessel ischemic disease. 2. Slight loss of cortical-medullary distinction in the left occipital lobe without hemorrhage. This represents an age-indeterminate infarct. 3. No acute cervical spine fracture. No listhesis. Marked disc space narrowing C6-7. Electronically Signed   By: Ashley Royalty M.D.   On: 07/01/2017 23:45   Ct Abdomen Pelvis W Contrast  Result Date: 07/02/2017 CLINICAL DATA:  84 year old male with abdominal pain and diarrhea. EXAM: CT ABDOMEN AND PELVIS WITH CONTRAST TECHNIQUE: Multidetector CT imaging of the abdomen  and pelvis was performed using the standard protocol following bolus administration of intravenous contrast. CONTRAST:  ISOVUE-300 IOPAMIDOL (ISOVUE-300) INJECTION 61% COMPARISON:  Abdominal CT dated 11/11/2015 FINDINGS: Lower chest: Partially visualized probable trace pleural effusions versus pleural thickening. There are minimal bibasilar atelectasis and interstitial coarsening. Mild cardiomegaly. No intra-abdominal free air or free fluid. Hepatobiliary: Apparent fatty infiltration of the liver. No intrahepatic biliary ductal dilatation. Small gallstones. No pericholecystic fluid. Pancreas: Unremarkable. No pancreatic ductal dilatation or surrounding inflammatory changes. Spleen: The spleen is unremarkable. Small amount of fluid noted adjacent to the spleen. Adrenals/Urinary Tract: The adrenal glands are unremarkable. Probable bilateral renal cysts. There is no hydronephrosis on either side. There is symmetric enhancement and excretion of contrast by both kidneys. The visualized ureters and urinary bladder appear unremarkable Stomach/Bowel: There is sigmoid diverticulosis without active inflammatory changes. No bowel obstruction. There is a small hiatal hernia. The appendix is not visualized, likely surgically absent. Vascular/Lymphatic: There  is atherosclerotic calcification of the aorta. An aorto bi-iliac endovascular stent graft repair is seen. The stent appears patent. Evaluation of the stent is however limited due to suboptimal opacification and timing of the contrast. High attenuating content outside of the confines of the stent within the excluded aneurysmal sac noted concerning for endoleak. Evaluation is however limited as precontrast images are not obtained. The aneurysm sac measures 5.0 x 5.2 cm in greatest axial dimensions similar to prior CT. No evidence of rupture. Reproductive: The prostate and seminal vesicles are grossly unremarkable. Other: None Musculoskeletal: Degenerative changes of the spine. No acute osseous pathology. IMPRESSION: 1. Colonic diverticulosis. No bowel obstruction or active inflammation. 2. Cholelithiasis. 3. Small amount of free fluid adjacent to the spleen of indeterminate etiology. Clinical correlation is recommended. 4. Small hiatal hernia. 5. Endovascular stent graft repair of infrarenal abdominal aortic aneurysm with findings concerning for an endoleak. No significant interval increase in the size of the excluded aneurysmal sac since the prior CT. No evidence of rupture. Electronically Signed   By: Anner Crete M.D.   On: 07/02/2017 01:16   Dg Hip Unilat With Pelvis 2-3 Views Left  Result Date: 07/01/2017 CLINICAL DATA:  Fall with hip pain EXAM: DG HIP (WITH OR WITHOUT PELVIS) 2-3V LEFT COMPARISON:  CT 11/11/2015 FINDINGS: Partially visualized aorto iliac stent. SI joints are patent bilaterally. Pubic symphysis and rami are intact. No definite acute displaced fracture or malalignment is seen. Joint space is maintained IMPRESSION: No definite acute osseous abnormality. Electronically Signed   By: Donavan Foil M.D.   On: 07/01/2017 23:41    Time Spent in minutes  30   Jani Gravel M.D on 07/03/2017 at 10:32 AM  Between 7am to 7pm - Pager - [phone]    After 7pm go to [email or web address] - password  [facility]  [facility] -  Office  [phone]

## 2017-07-04 LAB — GASTROINTESTINAL PANEL BY PCR, STOOL (REPLACES STOOL CULTURE)
Adenovirus F40/41: NOT DETECTED
Astrovirus: NOT DETECTED
CAMPYLOBACTER SPECIES: NOT DETECTED
CRYPTOSPORIDIUM: NOT DETECTED
Cyclospora cayetanensis: NOT DETECTED
Entamoeba histolytica: NOT DETECTED
Enteroaggregative E coli (EAEC): NOT DETECTED
Enteropathogenic E coli (EPEC): NOT DETECTED
Enterotoxigenic E coli (ETEC): NOT DETECTED
Giardia lamblia: NOT DETECTED
Norovirus GI/GII: NOT DETECTED
PLESIMONAS SHIGELLOIDES: NOT DETECTED
ROTAVIRUS A: NOT DETECTED
SAPOVIRUS (I, II, IV, AND V): NOT DETECTED
SHIGELLA/ENTEROINVASIVE E COLI (EIEC): NOT DETECTED
Salmonella species: NOT DETECTED
Shiga like toxin producing E coli (STEC): NOT DETECTED
Vibrio cholerae: NOT DETECTED
Vibrio species: NOT DETECTED
YERSINIA ENTEROCOLITICA: NOT DETECTED

## 2017-07-04 LAB — COMPREHENSIVE METABOLIC PANEL
ALK PHOS: 57 U/L (ref 38–126)
ALT: 16 U/L — AB (ref 17–63)
AST: 47 U/L — ABNORMAL HIGH (ref 15–41)
Albumin: 2.8 g/dL — ABNORMAL LOW (ref 3.5–5.0)
Anion gap: 9 (ref 5–15)
BILIRUBIN TOTAL: 0.4 mg/dL (ref 0.3–1.2)
BUN: 20 mg/dL (ref 6–20)
CALCIUM: 8.2 mg/dL — AB (ref 8.9–10.3)
CO2: 20 mmol/L — AB (ref 22–32)
CREATININE: 1.62 mg/dL — AB (ref 0.61–1.24)
Chloride: 107 mmol/L (ref 101–111)
GFR calc non Af Amer: 36 mL/min — ABNORMAL LOW (ref >60)
GFR, EST AFRICAN AMERICAN: 41 mL/min — AB (ref >60)
Glucose, Bld: 109 mg/dL — ABNORMAL HIGH (ref 65–99)
Potassium: 3.1 mmol/L — ABNORMAL LOW (ref 3.5–5.1)
SODIUM: 136 mmol/L (ref 135–145)
TOTAL PROTEIN: 5.9 g/dL — AB (ref 6.5–8.1)

## 2017-07-04 LAB — CBC
HEMATOCRIT: 29.5 % — AB (ref 39.0–52.0)
HEMOGLOBIN: 9.4 g/dL — AB (ref 13.0–17.0)
MCH: 27.2 pg (ref 26.0–34.0)
MCHC: 31.9 g/dL (ref 30.0–36.0)
MCV: 85.5 fL (ref 78.0–100.0)
Platelets: 247 10*3/uL (ref 150–400)
RBC: 3.45 MIL/uL — AB (ref 4.22–5.81)
RDW: 15.2 % (ref 11.5–15.5)
WBC: 6.4 10*3/uL (ref 4.0–10.5)

## 2017-07-04 MED ORDER — POTASSIUM CHLORIDE CRYS ER 20 MEQ PO TBCR
20.0000 meq | EXTENDED_RELEASE_TABLET | Freq: Once | ORAL | Status: AC
  Administered 2017-07-04: 20 meq via ORAL
  Filled 2017-07-04: qty 1

## 2017-07-04 MED ORDER — HALOPERIDOL LACTATE 5 MG/ML IJ SOLN
5.0000 mg | Freq: Four times a day (QID) | INTRAMUSCULAR | Status: DC | PRN
  Administered 2017-07-04 – 2017-07-05 (×2): 5 mg via INTRAVENOUS
  Filled 2017-07-04 (×2): qty 1

## 2017-07-04 MED ORDER — POTASSIUM CHLORIDE 10 MEQ/100ML IV SOLN
10.0000 meq | INTRAVENOUS | Status: AC
  Administered 2017-07-04 (×2): 10 meq via INTRAVENOUS
  Filled 2017-07-04 (×2): qty 100

## 2017-07-04 MED ORDER — PRO-STAT SUGAR FREE PO LIQD
30.0000 mL | Freq: Two times a day (BID) | ORAL | Status: DC
  Administered 2017-07-04 – 2017-07-08 (×7): 30 mL via ORAL
  Filled 2017-07-04 (×8): qty 30

## 2017-07-04 MED ORDER — POTASSIUM CHLORIDE IN NACL 20-0.9 MEQ/L-% IV SOLN: INTRAVENOUS | Status: DC

## 2017-07-04 NOTE — Progress Notes (Signed)
Left ankle bruised and swollen, no redness. Message left for Dr Maudie Mercury.

## 2017-07-04 NOTE — Progress Notes (Addendum)
Patient ID: Bryan Ford, male   DOB: Jul 21, 1925, 84 y.o.   MRN: [id number]                                                                PROGRESS NOTE                                                                                                                                                                                                             Patient Demographics:    Bryan Ford, is a 84 y.o. male, DOB - 1925-09-05, HMC:[id number]  Admit date - 07/01/2017   Admitting Physician Ivor Costa, MD  Outpatient Primary MD for the patient is Deland Pretty, MD  LOS - 2  Outpatient Specialists:    Chief Complaint  Patient presents with  . Fall  . Altered Mental Status       Brief Narrative   84 y.o.malewith medical history significant forUlcerative colitis, GERD, CKD3, AAA,hyperlipidemia, brought to the emergency department for evaluation of acute confusion. Over the last few days, the patient had not been eating as usual, but had no fever at home, or cough. As of yesterday, the patient began to show signs of weakness, confusion. History is obtained by chart, as no family is present at this time. He fell during that time, injuring his left ankle, but no apparent head injury or other injuries. Overnight, status was complicated by nonbloody diarrhea. His wife was taking him to the bathroom, to shower and remove the stools, when he fell again, injuring his left hip and low back, falling backwards. She brought him back to the bed, and cleaned in the ER, having reported that the patient showed copious diarrhea again. He also has chronic urine incontinence,but she denies any changes in it.  ED Course:T-max 101.8,pulse is now 69, respirations 16, O2 sats normal at 99. Weight 170 pounds. On presentation, the patient was febrile with a T-max of 101.8, without leukocytosis. Initially was called because of sepsis, due to fever and tachycardia, but this is improving with IV fluids.  Suspecting initial UTI, the patient received Rocephin in the ED, however the UA was negative. Flu and PCR were negative as well. Because of his history of ulcerative colitis, this diarrhea is suspected to be secondary to flare. No odor suggestive of C.  difficile he is present. At the time of evaluation, no stools were seen. CT of the abdomen and pelvis were showing chronic colitis. He was given Flagyl and Cipro, which are to continue on admission. Lactic acidandUA are negative  Labs remarkable fornormal white count at 9.4, normal platelets 278, bicarb 24, creatinine 1.7, potassium normal at 4.6, sodium 136. Hemoglobin is 9.3. Received Rocephin at the ED, then Flagyl and Cipro suspecting patient to have colitis        Subjective:    [name] today has no cough, no wheezing,  Afebrile over nite. No abdominal pain, no diarrhea, no brbpr, no n/v.  PT has recommended SNF for rehab since generally weak.   No headache, No chest pain, No new weakness tingling or numbness, - SOB.    Assessment  & Plan :    Principal Problem:   Ulcerative colitis ([place]) Active Problems:   AAA (abdominal aortic aneurysm) (HCC)   Hypertension   PAD (peripheral artery disease) (HCC)   Dyslipidemia, goal LDL below 70   Lung mass   Fall   Acute metabolic encephalopathy   CKD (chronic kidney disease), stage III (HCC)   GERD (gastroesophageal reflux disease)   Anemia of chronic disease   Chronic kidney disease, stage 3 (HCC)    Acute encephalopathywith falls Etiology unclear,but likely due to dehydration from acute diarrhea No seizures or CVA are suspected On presentation, the patient was febrile with a T-max of 101.8, without leukocytosis,(fever likely due to colitis flare). Initially was called because of sepsis, due to fever and tachycardia, but this is improving with IV fluids. Suspecting initial UTI, the patient received Rocephin in the ED, however the UA was negative. Flu and PCR  were negative as well. Because of his history of ulcerative colitis, this diarrhea is suspected to be secondary to flare. No odor suggestive of C. difficile he is present. At the time of evaluation, no stools were seen. CT of the abdomen and pelvis were showing chronic colitis. He was given Flagyl and Cipro, which are to continue on admission. Lactic acid negative, Procal 0.12  Continue Cipro and Flagyl PT/OT  Diarrhealikely to bout of Ulcerative colitis,patient has several episodes of nonbloody diarrhea prior to admission. On mesalamine at home. Fever likely due to inflammatory response from U.C? Stool culture/ GI pathogen panel pending Cont Cipro/Flagyl  Continue mesalamine Tolerated full liquid diet on 07/03/2017 Advance to heart healthy diet later today Cbc, Cmp in am Please GI consult if symptoms do not improve   Chronic kidney disease stage3baseline creatinine 1.3-1.4,  DC IVF since advancing diet Check cmp in am  Anemia of chronic diseaseHemoglobin on admission 9.3 at baseline Cbc in am  No transfusion is indicated at this time Continue Iron supplements  GERD,no acute symptoms, not on meds at home  Severe protein calorie malnutrition prostat   Code Status : FULL CODE  Family Communication  : w wife  Disposition Plan  : SNF ,  Social work is working on this  Barriers For Discharge :   Consults  :  PT/OT, social work  Procedures  :    DVT Prophylaxis  :  - SCDs   RecentLabs       Lab Results  Component Value Date   PLT 225 07/03/2017      Antibiotics  :  Cipro, flagyll 2/21=>       Anti-infectives (From admission, onward)   Start     Dose/Rate Route Frequency Ordered Stop   07/03/17  0500  ciprofloxacin (CIPRO) IVPB 400 mg     400 mg 200 mL/hr over 60 Minutes Intravenous Every 24 hours 07/02/17 0808     07/02/17 1300  metroNIDAZOLE (FLAGYL) IVPB 500 mg     500 mg 100 mL/hr over 60 Minutes Intravenous  Once  07/02/17 0752 07/02/17 1318   07/02/17 0800  metroNIDAZOLE (FLAGYL) IVPB 500 mg  Status:  Discontinued     500 mg 100 mL/hr over 60 Minutes Intravenous Every 8 hours 07/02/17 0752 07/02/17 0752   07/02/17 0230  ciprofloxacin (CIPRO) IVPB 400 mg     400 mg 200 mL/hr over 60 Minutes Intravenous  Once 07/02/17 0224 07/02/17 0713   07/02/17 0230  metroNIDAZOLE (FLAGYL) IVPB 500 mg     500 mg 100 mL/hr over 60 Minutes Intravenous  Once 07/02/17 0224 07/02/17 0712   07/01/17 2200  cefTRIAXone (ROCEPHIN) 2 g in sodium chloride 0.9 % 100 mL IVPB     2 g 200 mL/hr over 30 Minutes Intravenous  Once 07/01/17 2155 07/01/17 2247        Objective:   Vitals:   07/03/17 1817 07/03/17 1849 07/03/17 1939 07/04/17 0536  BP: (!) 174/67 (!) 159/66 (!) 149/70 (!) 164/69  Pulse:   78 75  Resp:   17 17  Temp:  98.2 F (36.8 C) 98.4 F (36.9 C) 98.6 F (37 C)  TempSrc:  Oral Oral Oral  SpO2:   96% 98%  Weight:    76.5 kg (168 lb 11.2 oz)  Height:        Wt Readings from Last 3 Encounters:  07/04/17 76.5 kg (168 lb 11.2 oz)  12/21/16 73 kg (161 lb)  12/16/15 72.6 kg (160 lb)     Intake/Output Summary (Last 24 hours) at 07/04/2017 0807 Last data filed at 07/04/2017 0545 Gross per 24 hour  Intake 2326.25 ml  Output 1700 ml  Net 626.25 ml     Physical Exam  Awake Alert, Oriented X 3, No new F.N deficits, Normal affect [place].AT,PERRAL Supple Neck,No JVD, No cervical lymphadenopathy appriciated.  Symmetrical Chest wall movement, Good air movement bilaterally, slight crackles bilateral base, no wheezing.  RRR,No Gallops,Rubs or new Murmurs, No Parasternal Heave +ve B.Sounds, Abd Soft, No tenderness, No organomegaly appriciated, No rebound - guarding or rigidity. No Cyanosis, Clubbing or edema, No new Rash or bruise     Data Review:    CBC Recent Labs  Lab 07/01/17 2128 07/03/17 0551 07/04/17 0519  WBC 9.4 6.6 6.4  HGB 9.3* 8.9* 9.4*  HCT 28.5* 28.2* 29.5*  PLT 278 225 247  MCV  87.4 87.9 85.5  MCH 28.5 27.7 27.2  MCHC 32.6 31.6 31.9  RDW 15.2 15.2 15.2  LYMPHSABS 0.5*  --   --   MONOABS 0.9  --   --   EOSABS 0.0  --   --   BASOSABS 0.0  --   --     Chemistries  Recent Labs  Lab 07/01/17 2128 07/03/17 0551 07/04/17 0519  NA 136 140 136  K 4.6 3.8 3.1*  CL 101 108 107  CO2 24 18* 20*  GLUCOSE 115* 82 109*  BUN 19 23* 20  CREATININE 1.70* 1.74* 1.62*  CALCIUM 8.8* 8.3* 8.2*  AST 27  --  47*  ALT 12*  --  16*  ALKPHOS 69  --  57  BILITOT 0.9  --  0.4   ------------------------------------------------------------------------------------------------------------------ No results for input(s): CHOL, HDL, LDLCALC, TRIG, CHOLHDL, LDLDIRECT in the last  72 hours.  No results found for: HGBA1C ------------------------------------------------------------------------------------------------------------------ No results for input(s): TSH, T4TOTAL, T3FREE, THYROIDAB in the last 72 hours.  Invalid input(s): FREET3 ------------------------------------------------------------------------------------------------------------------ Recent Labs    07/02/17 0835  VITAMINB12 205    Coagulation profile No results for input(s): INR, PROTIME in the last 168 hours.  No results for input(s): DDIMER in the last 72 hours.  Cardiac Enzymes No results for input(s): CKMB, TROPONINI, MYOGLOBIN in the last 168 hours.  Invalid input(s): CK ------------------------------------------------------------------------------------------------------------------ No results found for: BNP  Inpatient Medications  Scheduled Meds: . aspirin  81 mg Oral QPM  . feeding supplement (PRO-STAT SUGAR FREE 64)  30 mL Oral BID  . fentaNYL (SUBLIMAZE) injection  50 mcg Intravenous Once  . ferrous sulfate  325 mg Oral QPM  . heparin  5,000 Units Subcutaneous Q8H  . mesalamine  2.4 g Oral Q breakfast   Continuous Infusions: . 0.9 % NaCl with KCl 20 mEq / L    . ciprofloxacin Stopped  (07/04/17 0646)  . potassium chloride     PRN Meds:.acetaminophen, benzonatate, hydrALAZINE, HYDROcodone-acetaminophen, ipratropium-albuterol, ondansetron (ZOFRAN) IV, zolpidem  Micro Results Recent Results (from the past 240 hour(s))  Urine culture     Status: None   Collection Time: 07/01/17  9:45 PM  Result Value Ref Range Status   Specimen Description URINE, CATHETERIZED  Final   Special Requests NONE  Final   Culture   Final    NO GROWTH Performed at [facility], [address], [place], [place] [address]    Report Status 07/03/2017 FINAL  Final  Blood Culture (routine x 2)     Status: None (Preliminary result)   Collection Time: 07/01/17  9:54 PM  Result Value Ref Range Status   Specimen Description BLOOD RIGHT ANTECUBITAL  Final   Special Requests   Final    BOTTLES DRAWN AEROBIC AND ANAEROBIC Blood Culture adequate volume   Culture   Final    NO GROWTH 1 DAY Performed at [facility], [place] [address] [place], [place] [address]    Report Status PENDING  Incomplete  Blood Culture (routine x 2)     Status: None (Preliminary result)   Collection Time: 07/01/17 10:00 PM  Result Value Ref Range Status   Specimen Description BLOOD LEFT HAND  Final   Special Requests   Final    BOTTLES DRAWN AEROBIC AND ANAEROBIC Blood Culture adequate volume   Culture   Final    NO GROWTH 1 DAY Performed at [facility], [place] [address], [place], [place] [address]    Report Status PENDING  Incomplete  MRSA PCR Screening     Status: None   Collection Time: 07/02/17 11:51 AM  Result Value Ref Range Status   MRSA by PCR NEGATIVE NEGATIVE Final    Comment:        The GeneXpert MRSA Assay (FDA approved for NASAL specimens only), is one component of a comprehensive MRSA colonization surveillance program. It is not intended to diagnose MRSA infection nor to guide or monitor treatment for MRSA infections. Performed at [facility], [place] [address]  [place], [place] [address]     Radiology Reports Dg Chest 1 View  Result Date: 07/01/2017 CLINICAL DATA:  84 year old male with chest pain. EXAM: CHEST 1 VIEW COMPARISON:  Chest radiograph dated 11/05/2013 FINDINGS: There is mild cardiomegaly. No vascular congestion or edema minimal left lung base atelectatic changes. No focal consolidation, pleural effusion, or pneumothorax.  No acute osseous pathology. IMPRESSION: Left lung base densities, likely atelectatic changes no pleural effusion or pneumothorax. Mild cardiomegaly. Electronically Signed   By: Anner Crete M.D.   On: 07/01/2017 23:41   Ct Head Wo Contrast  Result Date: 07/01/2017 CLINICAL DATA:  Altered mental status.  Fell in shower today. EXAM: CT HEAD WITHOUT CONTRAST CT CERVICAL SPINE WITHOUT CONTRAST TECHNIQUE: Multidetector CT imaging of the head and cervical spine was performed following the standard protocol without intravenous contrast. Multiplanar CT image reconstructions of the cervical spine were also generated. COMPARISON:  10/31/2007 head CT FINDINGS: CT HEAD FINDINGS Brain: Mild superficial and moderate to marked central atrophy with marked ventriculomegaly is noted. A moderate degree of chronic appearing small vessel ischemic disease of periventricular white matter is identified. Age-indeterminate nonhemorrhagic infarct involving the left occipital lobe with loss of cortical-medullary distinction. No midline shift. No effacement of the basal cisterns or fourth ventricle. Vascular: Moderate atherosclerosis of the carotid siphons. No hyperdense vessels. Skull: No skull fracture. Sinuses/Orbits: Mild ethmoid and moderate right maxillary sinus mucosal thickening. Trace air-fluid level in the right maxillary sinus may represent an acute on chronic sinusitis. Right mastoid effusion with opacification noted. Left mastoid is clear. Bilateral lens replacements. Other: No significant scalp contusion or hematoma noted. CT CERVICAL SPINE FINDINGS  Alignment: Maintained cervical lordosis. Skull base and vertebrae: Osteoarthritis and remodeling across the atlantodental interval. Calcification of the transverse ligament about the odontoid PEG. The clivus abuts the superior margin of the anterior arch of C1 on the lateral view. No acute vertebral fracture. Soft tissues and spinal canal: No prevertebral fluid or swelling. No visible canal hematoma. Disc levels: Marked disc space narrowing C6-7. No significant central or neural foraminal encroachment. Multilevel degenerative facet arthropathy. Upper chest: Negative. Other: None IMPRESSION: 1. Mild superficial and marked central atrophy with moderate degree of chronic appearing small vessel ischemic disease. 2. Slight loss of cortical-medullary distinction in the left occipital lobe without hemorrhage. This represents an age-indeterminate infarct. 3. No acute cervical spine fracture. No listhesis. Marked disc space narrowing C6-7. Electronically Signed   By: Ashley Royalty M.D.   On: 07/01/2017 23:45   Ct Cervical Spine Wo Contrast  Result Date: 07/01/2017 CLINICAL DATA:  Altered mental status.  Fell in shower today. EXAM: CT HEAD WITHOUT CONTRAST CT CERVICAL SPINE WITHOUT CONTRAST TECHNIQUE: Multidetector CT imaging of the head and cervical spine was performed following the standard protocol without intravenous contrast. Multiplanar CT image reconstructions of the cervical spine were also generated. COMPARISON:  10/31/2007 head CT FINDINGS: CT HEAD FINDINGS Brain: Mild superficial and moderate to marked central atrophy with marked ventriculomegaly is noted. A moderate degree of chronic appearing small vessel ischemic disease of periventricular white matter is identified. Age-indeterminate nonhemorrhagic infarct involving the left occipital lobe with loss of cortical-medullary distinction. No midline shift. No effacement of the basal cisterns or fourth ventricle. Vascular: Moderate atherosclerosis of the carotid  siphons. No hyperdense vessels. Skull: No skull fracture. Sinuses/Orbits: Mild ethmoid and moderate right maxillary sinus mucosal thickening. Trace air-fluid level in the right maxillary sinus may represent an acute on chronic sinusitis. Right mastoid effusion with opacification noted. Left mastoid is clear. Bilateral lens replacements. Other: No significant scalp contusion or hematoma noted. CT CERVICAL SPINE FINDINGS Alignment: Maintained cervical lordosis. Skull base and vertebrae: Osteoarthritis and remodeling across the atlantodental interval. Calcification of the transverse ligament about the odontoid PEG. The clivus abuts the superior margin of the anterior arch of C1 on the lateral view. No acute  vertebral fracture. Soft tissues and spinal canal: No prevertebral fluid or swelling. No visible canal hematoma. Disc levels: Marked disc space narrowing C6-7. No significant central or neural foraminal encroachment. Multilevel degenerative facet arthropathy. Upper chest: Negative. Other: None IMPRESSION: 1. Mild superficial and marked central atrophy with moderate degree of chronic appearing small vessel ischemic disease. 2. Slight loss of cortical-medullary distinction in the left occipital lobe without hemorrhage. This represents an age-indeterminate infarct. 3. No acute cervical spine fracture. No listhesis. Marked disc space narrowing C6-7. Electronically Signed   By: Ashley Royalty M.D.   On: 07/01/2017 23:45   Ct Abdomen Pelvis W Contrast  Result Date: 07/02/2017 CLINICAL DATA:  85 year old male with abdominal pain and diarrhea. EXAM: CT ABDOMEN AND PELVIS WITH CONTRAST TECHNIQUE: Multidetector CT imaging of the abdomen and pelvis was performed using the standard protocol following bolus administration of intravenous contrast. CONTRAST:  ISOVUE-300 IOPAMIDOL (ISOVUE-300) INJECTION 61% COMPARISON:  Abdominal CT dated 11/11/2015 FINDINGS: Lower chest: Partially visualized probable trace pleural effusions  versus pleural thickening. There are minimal bibasilar atelectasis and interstitial coarsening. Mild cardiomegaly. No intra-abdominal free air or free fluid. Hepatobiliary: Apparent fatty infiltration of the liver. No intrahepatic biliary ductal dilatation. Small gallstones. No pericholecystic fluid. Pancreas: Unremarkable. No pancreatic ductal dilatation or surrounding inflammatory changes. Spleen: The spleen is unremarkable. Small amount of fluid noted adjacent to the spleen. Adrenals/Urinary Tract: The adrenal glands are unremarkable. Probable bilateral renal cysts. There is no hydronephrosis on either side. There is symmetric enhancement and excretion of contrast by both kidneys. The visualized ureters and urinary bladder appear unremarkable Stomach/Bowel: There is sigmoid diverticulosis without active inflammatory changes. No bowel obstruction. There is a small hiatal hernia. The appendix is not visualized, likely surgically absent. Vascular/Lymphatic: There is atherosclerotic calcification of the aorta. An aorto bi-iliac endovascular stent graft repair is seen. The stent appears patent. Evaluation of the stent is however limited due to suboptimal opacification and timing of the contrast. High attenuating content outside of the confines of the stent within the excluded aneurysmal sac noted concerning for endoleak. Evaluation is however limited as precontrast images are not obtained. The aneurysm sac measures 5.0 x 5.2 cm in greatest axial dimensions similar to prior CT. No evidence of rupture. Reproductive: The prostate and seminal vesicles are grossly unremarkable. Other: None Musculoskeletal: Degenerative changes of the spine. No acute osseous pathology. IMPRESSION: 1. Colonic diverticulosis. No bowel obstruction or active inflammation. 2. Cholelithiasis. 3. Small amount of free fluid adjacent to the spleen of indeterminate etiology. Clinical correlation is recommended. 4. Small hiatal hernia. 5. Endovascular  stent graft repair of infrarenal abdominal aortic aneurysm with findings concerning for an endoleak. No significant interval increase in the size of the excluded aneurysmal sac since the prior CT. No evidence of rupture. Electronically Signed   By: Anner Crete M.D.   On: 07/02/2017 01:16   Dg Chest Port 1 View  Result Date: 07/03/2017 CLINICAL DATA:  Cough and wheezing. EXAM: PORTABLE CHEST 1 VIEW COMPARISON:  07/01/2017 and prior exams FINDINGS: UPPER limits normal heart size noted. There is no evidence of focal airspace disease, pulmonary edema, suspicious pulmonary nodule/mass, pleural effusion, or pneumothorax. No acute bony abnormalities are identified. IMPRESSION: No active disease. Electronically Signed   By: Margarette Canada M.D.   On: 07/03/2017 20:24   Dg Hip Unilat With Pelvis 2-3 Views Left  Result Date: 07/01/2017 CLINICAL DATA:  Fall with hip pain EXAM: DG HIP (WITH OR WITHOUT PELVIS) 2-3V LEFT COMPARISON:  CT 11/11/2015 FINDINGS:  Partially visualized aorto iliac stent. SI joints are patent bilaterally. Pubic symphysis and rami are intact. No definite acute displaced fracture or malalignment is seen. Joint space is maintained IMPRESSION: No definite acute osseous abnormality. Electronically Signed   By: Donavan Foil M.D.   On: 07/01/2017 23:41    Time Spent in minutes  30   Jani Gravel M.D on 07/04/2017 at 8:07 AM  Between 7am to 7pm - Pager - [phone]    After 7pm go to [email or web address] - password [email or web address]  [facility] -  Office  [phone]

## 2017-07-04 NOTE — Evaluation (Signed)
Occupational Therapy Evaluation and defer to SNF Patient Details Name: Bryan Ford MRN: [id number] DOB: December 30, 1925 Today's Date: 07/04/2017    History of Present Illness Bryan Ford is a 84 y/o male admitted on 07/01/17 due to acute confusion and diarrhea with multiple falls within the home without injury. Patient with a PMH significant for ulcerative colitis, GERD, CKD3, AAA.   Clinical Impression   PTA Pt independent in ADL and mobility. Pt is currently max A for bed mobility and min A for UB ADL and total A for LB ADL. Impacted by decreased balance, cognition (safety awareness, problem solving, attention), activity tolerance and very impacted by pain in the lower abdomen and L hip. Pt will benefit from skilled OT post acute at the SNF level to maximize safety and independence in ADL and functional transfers in hopes of getting Pt back to PLOF and preventing further falls.    Follow Up Recommendations  SNF;Supervision/Assistance - 24 hour    Equipment Recommendations  Other (comment)(defer to next venue)    Recommendations for Other Services       Precautions / Restrictions Precautions Precautions: Fall Restrictions Weight Bearing Restrictions: No      Mobility Bed Mobility Overal bed mobility: Needs Assistance Bed Mobility: Supine to Sit;Sit to Supine     Supine to sit: Mod assist Sit to supine: Max assist   General bed mobility comments: Mod A for LE management and trunk control, use of bed pad to assist bringing hips to EOB  Transfers                 General transfer comment: Declined transfer at this time - felt it would be better and safer with 2 staff to assist    Balance Overall balance assessment: Needs assistance Sitting-balance support: Bilateral upper extremity supported;Feet supported Sitting balance-Leahy Scale: Poor Sitting balance - Comments: Pt unable to sit EOB unassisted Postural control: Right lateral lean;Posterior lean                                  ADL either performed or assessed with clinical judgement   ADL Overall ADL's : Needs assistance/impaired Eating/Feeding: Modified independent Eating/Feeding Details (indicate cue type and reason): able to bring hands to mouth and hold utensils appropriately Grooming: Set up;Bed level;Oral care   Upper Body Bathing: Moderate assistance   Lower Body Bathing: Total assistance   Upper Body Dressing : Moderate assistance   Lower Body Dressing: Total assistance   Toilet Transfer: Total assistance Toilet Transfer Details (indicate cue type and reason): bed level at this time, assist for rolling Toileting- Clothing Manipulation and Hygiene: Total assistance;Bed level Toileting - Clothing Manipulation Details (indicate cue type and reason): condom cath and rolling for peri care       General ADL Comments: decreased cognition, balance, coordination, pain all impacting pt's ability to perform ADL     Vision         Perception     Praxis      Pertinent Vitals/Pain Pain Assessment: Faces Faces Pain Scale: Hurts whole lot Pain Location: lower abdomen, L hip Pain Descriptors / Indicators: Grimacing;Guarding;Moaning;Shooting Pain Intervention(s): Limited activity within patient's tolerance;Monitored during session;Repositioned     Hand Dominance Right   Extremity/Trunk Assessment Upper Extremity Assessment Upper Extremity Assessment: Generalized weakness   Lower Extremity Assessment Lower Extremity Assessment: LLE deficits/detail LLE Deficits / Details: all scans/x rays etc have come back negative- but  L hip is VERY painful for patient LLE: Unable to fully assess due to pain LLE Sensation: WNL LLE Coordination: decreased gross motor       Communication Communication Communication: HOH   Cognition Arousal/Alertness: Awake/alert Behavior During Therapy: WFL for tasks assessed/performed Overall Cognitive Status: Impaired/Different from  baseline Area of Impairment: Orientation;Attention;Memory;Following commands;Safety/judgement;Awareness;Problem solving                 Orientation Level: Disoriented to;Situation Current Attention Level: Sustained Memory: Decreased short-term memory Following Commands: Follows one step commands inconsistently;Follows one step commands with increased time Safety/Judgement: Decreased awareness of safety;Decreased awareness of deficits Awareness: Emergent Problem Solving: Slow processing;Decreased initiation;Difficulty sequencing;Requires verbal cues;Requires tactile cues General Comments: Pt required multimodal cues for coming to EOB, trouble following commands, speech tangential at times (hard to discern if patient was joking around or unable to answer questions)   General Comments  Wife outside room and popped in for moments during therapy. She is very anxious to get Pt "settled" in rehab to start his journey back to [facility]    Exercises     Shoulder Instructions      Home Living Family/patient expects to be discharged to:: Skilled nursing facility Living Arrangements: Spouse/significant other Available Help at Discharge: Family Type of Home: House Home Access: Stairs to enter [employer] of Steps: 3 Entrance Stairs-Rails: Right Home Layout: Two level;Able to live on main level with bedroom/bathroom Alternate Level Stairs-Number of Steps: flight   Bathroom Shower/Tub: Walk-in shower(upstairs)   Biochemist, clinical: Standard     Home Equipment: Civil engineer, contracting - built in          Prior Functioning/Environment Level of Independence: Independent        Comments: wife providing PLOF - independent without AD, lived on 2nd story, enjoyed working outdoors        OT Problem List: Decreased range of motion;Decreased activity tolerance;Impaired balance (sitting and/or standing);Decreased cognition;Decreased safety awareness;Decreased knowledge of use of DME or  AE;Decreased knowledge of precautions;Pain      OT Treatment/Interventions:      OT Goals(Current goals can be found in the care plan section) Acute Rehab OT Goals Patient Stated Goal: family would like patient to regain strength to transfer and walk OT Goal Formulation: With family Time For Goal Achievement: 07/18/17 Potential to Achieve Goals: Good  OT Frequency:     Barriers to D/C:            Co-evaluation              AM-PAC PT "6 Clicks" Daily Activity     Outcome Measure Help from another person eating meals?: A Little Help from another person taking care of personal grooming?: A Little Help from another person toileting, which includes using toliet, bedpan, or urinal?: Total Help from another person bathing (including washing, rinsing, drying)?: A Lot Help from another person to put on and taking off regular upper body clothing?: A Lot Help from another person to put on and taking off regular lower body clothing?: Total 6 Click Score: 12   End of Session Equipment Utilized During Treatment: Gait belt;Rolling walker Nurse Communication: Mobility status  Activity Tolerance: Patient limited by pain Patient left: in bed;with call bell/phone within reach;with bed alarm set;with family/visitor present  OT Visit Diagnosis: Unsteadiness on feet (R26.81);Other abnormalities of gait and mobility (R26.89);Repeated falls (R29.6);Muscle weakness (generalized) (M62.81);Other symptoms and signs involving cognitive function;Pain Pain - Right/Left: Left Pain - part of body: Hip  Time: 1349-1420 OT Time Calculation (min): 31 min Charges:  OT General Charges $OT Visit: 1 Visit OT Evaluation $OT Eval Moderate Complexity: 1 Mod OT Treatments $Self Care/Home Management : 8-22 mins G-Codes:    Hulda Humphrey OTR/L [place] 07/04/2017, 4:21 PM

## 2017-07-05 ENCOUNTER — Inpatient Hospital Stay (HOSPITAL_COMMUNITY): Payer: Medicare Other

## 2017-07-05 ENCOUNTER — Ambulatory Visit: Payer: Medicare Other | Admitting: Surgery

## 2017-07-05 ENCOUNTER — Inpatient Hospital Stay: Admission: RE | Admit: 2017-07-05 | Payer: Medicare Other | Source: Ambulatory Visit

## 2017-07-05 DIAGNOSIS — R062 Wheezing: Secondary | ICD-10-CM

## 2017-07-05 DIAGNOSIS — R05 Cough: Secondary | ICD-10-CM

## 2017-07-05 LAB — CBC
HCT: 26.7 % — ABNORMAL LOW (ref 39.0–52.0)
Hemoglobin: 8.6 g/dL — ABNORMAL LOW (ref 13.0–17.0)
MCH: 27.5 pg (ref 26.0–34.0)
MCHC: 32.2 g/dL (ref 30.0–36.0)
MCV: 85.3 fL (ref 78.0–100.0)
PLATELETS: 243 10*3/uL (ref 150–400)
RBC: 3.13 MIL/uL — ABNORMAL LOW (ref 4.22–5.81)
RDW: 15.2 % (ref 11.5–15.5)
WBC: 4.9 10*3/uL (ref 4.0–10.5)

## 2017-07-05 LAB — COMPREHENSIVE METABOLIC PANEL
ALT: 18 U/L (ref 17–63)
ANION GAP: 9 (ref 5–15)
AST: 52 U/L — ABNORMAL HIGH (ref 15–41)
Albumin: 2.9 g/dL — ABNORMAL LOW (ref 3.5–5.0)
Alkaline Phosphatase: 51 U/L (ref 38–126)
BUN: 20 mg/dL (ref 6–20)
CHLORIDE: 108 mmol/L (ref 101–111)
CO2: 21 mmol/L — AB (ref 22–32)
Calcium: 8.4 mg/dL — ABNORMAL LOW (ref 8.9–10.3)
Creatinine, Ser: 1.48 mg/dL — ABNORMAL HIGH (ref 0.61–1.24)
GFR calc Af Amer: 46 mL/min — ABNORMAL LOW (ref >60)
GFR calc non Af Amer: 40 mL/min — ABNORMAL LOW (ref >60)
GLUCOSE: 115 mg/dL — AB (ref 65–99)
POTASSIUM: 3.1 mmol/L — AB (ref 3.5–5.1)
SODIUM: 138 mmol/L (ref 135–145)
Total Bilirubin: 0.7 mg/dL (ref 0.3–1.2)
Total Protein: 5.6 g/dL — ABNORMAL LOW (ref 6.5–8.1)

## 2017-07-05 LAB — MAGNESIUM: MAGNESIUM: 1.5 mg/dL — AB (ref 1.7–2.4)

## 2017-07-05 MED ORDER — POTASSIUM CHLORIDE 20 MEQ/15ML (10%) PO SOLN
40.0000 meq | Freq: Once | ORAL | Status: AC
  Administered 2017-07-05: 40 meq via ORAL
  Filled 2017-07-05: qty 30

## 2017-07-05 MED ORDER — MAGNESIUM SULFATE 2 GM/50ML IV SOLN
2.0000 g | Freq: Once | INTRAVENOUS | Status: AC
  Administered 2017-07-05: 2 g via INTRAVENOUS
  Filled 2017-07-05: qty 50

## 2017-07-05 MED ORDER — CEFTRIAXONE SODIUM 2 G IJ SOLR
2.0000 g | INTRAMUSCULAR | Status: DC
  Administered 2017-07-05 – 2017-07-07 (×3): 2 g via INTRAVENOUS
  Filled 2017-07-05 (×3): qty 20

## 2017-07-05 MED ORDER — METRONIDAZOLE 500 MG PO TABS
500.0000 mg | ORAL_TABLET | Freq: Three times a day (TID) | ORAL | Status: DC
  Administered 2017-07-05 – 2017-07-07 (×7): 500 mg via ORAL
  Filled 2017-07-05 (×7): qty 1

## 2017-07-05 NOTE — Progress Notes (Signed)
Patient confused, agitated, and irritable. Patient pulling off gown, pulling at IV line, and attempting to get out of bed without assistance. Patient unsteady on feet and high fall risk with history of previous fall. Safety mitts placed on patient.

## 2017-07-05 NOTE — Progress Notes (Signed)
Physical Therapy Treatment Patient Details Name: Bryan Ford MRN: [id number] DOB: 19-May-1925 Today's Date: 07/05/2017    History of Present Illness Bryan Ford is a 84 y/o male admitted on 07/01/17 due to acute confusion and diarrhea with multiple falls within the home without injury. Patient with a PMH significant for ulcerative colitis, GERD, CKD3, AAA.    PT Comments    Pt progressing towards goals and able to perform transfer to chair this session. Very unsteady and demonstrated posterior lean, requiring mod A +2 for mobility. Noted L ankle swelling following session; notified RN and RN reports she has notified MD. Pt did not complain of pain in that ankle. Current recommendations appropriate. Will continue to follow acutely to maximize functional mobility independence and safety.    Follow Up Recommendations  SNF     Equipment Recommendations  None recommended by PT    Recommendations for Other Services OT consult     Precautions / Restrictions Precautions Precautions: Fall;Other (comment) Precaution Comments: Reports multiple falls at home; safety mits on bilat hands.  Restrictions Weight Bearing Restrictions: No    Mobility  Bed Mobility Overal bed mobility: Needs Assistance Bed Mobility: Supine to Sit     Supine to sit: Mod assist;+2 for physical assistance     General bed mobility comments: Mod A +2 for assist with LEs and for trunk elevation. Pt with posterior lean upon sitting requiring mod A for support, however, with manual cues for anterior weightshift able to maintain balance with supervision.   Transfers Overall transfer level: Needs assistance Equipment used: 2 person hand held assist Transfers: Sit to/from [facility] Sit to Stand: Mod assist;+2 physical assistance Stand pivot transfers: Mod assist;+2 physical assistance       General transfer comment: Mod +2 for lift assist and steadying assist to stand. Pt with posterior lean upon  standing and required manual assist to remain upright. Mod A +2 for steadying throughout stand pivot to recliner. Cues for LE sequencing throughout.   Ambulation/Gait             General Gait Details: Deferred secondary to heavy posterior lean.    Stairs            Wheelchair Mobility    Modified Rankin (Stroke Patients Only)       Balance Overall balance assessment: Needs assistance Sitting-balance support: Bilateral upper extremity supported;Feet supported Sitting balance-Leahy Scale: Poor Sitting balance - Comments: Reliant on BUE and mod A initially.  Postural control: Posterior lean Standing balance support: Bilateral upper extremity supported Standing balance-Leahy Scale: Poor Standing balance comment: Posterior lean in standing and required mod A +2 for mobility.                             Cognition Arousal/Alertness: Awake/alert Behavior During Therapy: WFL for tasks assessed/performed Overall Cognitive Status: Impaired/Different from baseline Area of Impairment: Orientation;Attention;Memory;Following commands;Safety/judgement;Awareness;Problem solving                 Orientation Level: Disoriented to;Place;Situation Current Attention Level: Sustained Memory: Decreased short-term memory;Decreased recall of precautions Following Commands: Follows one step commands inconsistently;Follows one step commands with increased time Safety/Judgement: Decreased awareness of safety;Decreased awareness of deficits Awareness: Emergent Problem Solving: Slow processing;Decreased initiation;Difficulty sequencing;Requires verbal cues;Requires tactile cues General Comments: Pt required multimodal cues throughout for mobility and increased time to perform tasks.       Exercises      General Comments General comments (  skin integrity, edema, etc.): Wife stepped out throughout session, as pt agitated night before. No agitation noted throughout session. Did  note L ankle swelling; notified RN, and RN reports she has notified MD.       Pertinent Vitals/Pain Pain Assessment: Faces Faces Pain Scale: Hurts even more Pain Location: L hip/groin  Pain Descriptors / Indicators: Grimacing;Guarding;Moaning;Shooting Pain Intervention(s): Limited activity within patient's tolerance;Monitored during session;Repositioned    Home Living                      Prior Function            PT Goals (current goals can now be found in the care plan section) Acute Rehab PT Goals Patient Stated Goal: family would like patient to regain strength to transfer and walk PT Goal Formulation: With patient/family Time For Goal Achievement: 07/17/17 Potential to Achieve Goals: Good Progress towards PT goals: Progressing toward goals    Frequency    Min 2X/week      PT Plan Current plan remains appropriate    Co-evaluation              AM-PAC PT "6 Clicks" Daily Activity  Outcome Measure  Difficulty turning over in bed (including adjusting bedclothes, sheets and blankets)?: Unable Difficulty moving from lying on back to sitting on the side of the bed? : Unable Difficulty sitting down on and standing up from a chair with arms (e.g., wheelchair, bedside commode, etc,.)?: Unable Help needed moving to and from a bed to chair (including a wheelchair)?: A Lot Help needed walking in hospital room?: A Lot Help needed climbing 3-5 steps with a railing? : Total 6 Click Score: 8    End of Session Equipment Utilized During Treatment: Gait belt Activity Tolerance: Patient tolerated treatment well Patient left: in chair;with call bell/phone within reach;with chair alarm set;with family/visitor present Nurse Communication: Mobility status PT Visit Diagnosis: Unsteadiness on feet (R26.81);Other abnormalities of gait and mobility (R26.89);Muscle weakness (generalized) (M62.81);History of falling (Z91.81);Difficulty in walking, not elsewhere classified  (R26.2)     Time: 1150-1210 PT Time Calculation (min) (ACUTE ONLY): 20 min  Charges:  $Therapeutic Activity: 8-22 mins                    G Codes:       Leighton Ruff, PT, DPT  Acute Rehabilitation Services  Pager: [phone]    Rudean Hitt 07/05/2017, 12:56 PM

## 2017-07-05 NOTE — Progress Notes (Signed)
PROGRESS NOTE    Bryan Ford  ALP:[id number] DOB: 04-Oct-1925 DOA: 07/01/2017 PCP: Deland Pretty, MD   Brief Narrative: Bryan Ford is a 84 y.o. malewith medical history significant forUlcerative colitis, GERD, CKD3, AAA,hyperlipidemia. He presented with confusion, fever and tachycardia. Sepsis with unknown source, but presumed GI. Workup currently unremarkable and patient still with confusion.   Assessment & Plan:   Principal Problem:   Ulcerative colitis ([place]) Active Problems:   AAA (abdominal aortic aneurysm) (HCC)   Hypertension   PAD (peripheral artery disease) (HCC)   Dyslipidemia, goal LDL below 70   Lung mass   Fall   Acute metabolic encephalopathy   CKD (chronic kidney disease), stage III (HCC)   GERD (gastroesophageal reflux disease)   Anemia of chronic disease   Chronic kidney disease, stage 3 (HCC)   Acute encephalopathy S/p fall No current evidence of infection. Patient presented with fever and tachycardia which have resolved with fluids. He has been on Cipro which may exacerbate mental status. Concern for UC flare, however, CT was not specific. Patient does have persistent coughing with wheezing. Patient is still having a lot of stool output -CT scan of chest -If unremarkable CT scan, will obtain a head MRI  Cough with wheezing Previous x-ray negative. On room air. Former smoker. Chest x-ray unremarkable -CT scan as mentioned above  CKD stage 3 Stable.  Anemia of chronic disease Stable.  GERD Stable  Severe protein calorie malnutrition Body mass index is 26.37 kg/m.  -Continue supplementation  Hyponatremia Hypomagnesemia -Supplementation   DVT prophylaxis: SCDs Code Status:   Code Status: Full Code Family Communication: Wife on telephone Disposition Plan: Pending clinical course   Consultants:   None  Procedures:   None  Antimicrobials:  Ciprofloxacin  Metronidazole  Ceftriaxone    Subjective: No concerns from  patient.  Objective: Vitals:   07/04/17 0536 07/04/17 1339 07/04/17 1946 07/05/17 0454  BP: (!) 164/69 (!) 168/81 (!) 189/75 (!) 154/62  Pulse: 75 61 69 77  Resp: 17 17 20  (!) 22  Temp: 98.6 F (37 C) 98.8 F (37.1 C) 98.4 F (36.9 C) 98.6 F (37 C)  TempSrc: Oral Oral Oral Oral  SpO2: 98% 98% 98% 98%  Weight: 76.5 kg (168 lb 11.2 oz)   74.1 kg (163 lb 5.8 oz)  Height:        Intake/Output Summary (Last 24 hours) at 07/05/2017 0858 Last data filed at 07/05/2017 0649 Gross per 24 hour  Intake 440 ml  Output 1950 ml  Net -1510 ml   Filed Weights   07/02/17 1127 07/04/17 0536 07/05/17 0454  Weight: 75 kg (165 lb 5.5 oz) 76.5 kg (168 lb 11.2 oz) 74.1 kg (163 lb 5.8 oz)    Examination:  General exam: Appears calm and comfortable Respiratory system: Diffuse wheezing. Respiratory effort normal. Cardiovascular system: S1 & S2 heard, RRR. No murmurs, rubs, gallops or clicks. Gastrointestinal system: Abdomen is nondistended, soft and nontender. No organomegaly or masses felt. Normal bowel sounds heard. Central nervous system: Alert and oriented to person only. No focal neurological deficits. Extremities: LLE 1+ edema around ankles and lower leg. No calf tenderness Skin: No cyanosis. No rashes Psychiatry: Judgement and insight appear normal. Mood & affect appropriate.     Data Reviewed: I have personally reviewed following labs and imaging studies  CBC: Recent Labs  Lab 07/01/17 2128 07/03/17 0551 07/04/17 0519 07/05/17 0621  WBC 9.4 6.6 6.4 4.9  NEUTROABS 8.0*  --   --   --  HGB 9.3* 8.9* 9.4* 8.6*  HCT 28.5* 28.2* 29.5* 26.7*  MCV 87.4 87.9 85.5 85.3  PLT 278 225 247 676   Basic Metabolic Panel: Recent Labs  Lab 07/01/17 2128 07/03/17 0551 07/04/17 0519 07/05/17 0621  NA 136 140 136 138  K 4.6 3.8 3.1* 3.1*  CL 101 108 107 108  CO2 24 18* 20* 21*  GLUCOSE 115* 82 109* 115*  BUN 19 23* 20 20  CREATININE 1.70* 1.74* 1.62* 1.48*  CALCIUM 8.8* 8.3* 8.2*  8.4*  MG  --   --   --  1.5*   GFR: Estimated Creatinine Clearance: 29.9 mL/min (A) (by C-G formula based on SCr of 1.48 mg/dL (H)). Liver Function Tests: Recent Labs  Lab 07/01/17 2128 07/04/17 0519 07/05/17 0621  AST 27 47* 52*  ALT 12* 16* 18  ALKPHOS 69 57 51  BILITOT 0.9 0.4 0.7  PROT 6.9 5.9* 5.6*  ALBUMIN 3.5 2.8* 2.9*   No results for input(s): LIPASE, AMYLASE in the last 168 hours. No results for input(s): AMMONIA in the last 168 hours. Coagulation Profile: No results for input(s): INR, PROTIME in the last 168 hours. Cardiac Enzymes: No results for input(s): CKTOTAL, CKMB, CKMBINDEX, TROPONINI in the last 168 hours. BNP (last 3 results) No results for input(s): PROBNP in the last 8760 hours. HbA1C: No results for input(s): HGBA1C in the last 72 hours. CBG: Recent Labs  Lab 07/01/17 2131  GLUCAP 106*   Lipid Profile: No results for input(s): CHOL, HDL, LDLCALC, TRIG, CHOLHDL, LDLDIRECT in the last 72 hours. Thyroid Function Tests: No results for input(s): TSH, T4TOTAL, FREET4, T3FREE, THYROIDAB in the last 72 hours. Anemia Panel: No results for input(s): VITAMINB12, FOLATE, FERRITIN, TIBC, IRON, RETICCTPCT in the last 72 hours. Sepsis Labs: Recent Labs  Lab 07/02/17 0530 07/02/17 0836 07/02/17 1211 07/02/17 1455  PROCALCITON 0.12  --   --   --   LATICACIDVEN 1.2 1.2 1.7 0.8    Recent Results (from the past 240 hour(s))  Urine culture     Status: None   Collection Time: 07/01/17  9:45 PM  Result Value Ref Range Status   Specimen Description URINE, CATHETERIZED  Final   Special Requests NONE  Final   Culture   Final    NO GROWTH Performed at [facility], [place] [address] [place], [place] [address]    Report Status 07/03/2017 FINAL  Final  Blood Culture (routine x 2)     Status: None (Preliminary result)   Collection Time: 07/01/17  9:54 PM  Result Value Ref Range Status   Specimen Description BLOOD RIGHT ANTECUBITAL  Final   Special  Requests   Final    BOTTLES DRAWN AEROBIC AND ANAEROBIC Blood Culture adequate volume   Culture   Final    NO GROWTH 2 DAYS Performed at [facility], [place] [address], [place], [place] [address]    Report Status PENDING  Incomplete  Blood Culture (routine x 2)     Status: None (Preliminary result)   Collection Time: 07/01/17 10:00 PM  Result Value Ref Range Status   Specimen Description BLOOD LEFT HAND  Final   Special Requests   Final    BOTTLES DRAWN AEROBIC AND ANAEROBIC Blood Culture adequate volume   Culture   Final    NO GROWTH 2 DAYS Performed at [facility], [place] [address] [place], [place] [address]    Report Status PENDING  Incomplete  MRSA PCR Screening  Status: None   Collection Time: 07/02/17 11:51 AM  Result Value Ref Range Status   MRSA by PCR NEGATIVE NEGATIVE Final    Comment:        The GeneXpert MRSA Assay (FDA approved for NASAL specimens only), is one component of a comprehensive MRSA colonization surveillance program. It is not intended to diagnose MRSA infection nor to guide or monitor treatment for MRSA infections. Performed at [facility], [place] [address], [place], [place] [address]   Gastrointestinal Panel by PCR , Stool     Status: None   Collection Time: 07/03/17 12:53 AM  Result Value Ref Range Status   Campylobacter species NOT DETECTED NOT DETECTED Final   Plesimonas shigelloides NOT DETECTED NOT DETECTED Final   Salmonella species NOT DETECTED NOT DETECTED Final   Yersinia enterocolitica NOT DETECTED NOT DETECTED Final   Vibrio species NOT DETECTED NOT DETECTED Final   Vibrio cholerae NOT DETECTED NOT DETECTED Final   Enteroaggregative E coli (EAEC) NOT DETECTED NOT DETECTED Final   Enteropathogenic E coli (EPEC) NOT DETECTED NOT DETECTED Final   Enterotoxigenic E coli (ETEC) NOT DETECTED NOT DETECTED Final   Shiga like toxin producing E coli (STEC) NOT DETECTED NOT DETECTED Final   Shigella/Enteroinvasive E coli  (EIEC) NOT DETECTED NOT DETECTED Final   Cryptosporidium NOT DETECTED NOT DETECTED Final   Cyclospora cayetanensis NOT DETECTED NOT DETECTED Final   Entamoeba histolytica NOT DETECTED NOT DETECTED Final   Giardia lamblia NOT DETECTED NOT DETECTED Final   Adenovirus F40/41 NOT DETECTED NOT DETECTED Final   Astrovirus NOT DETECTED NOT DETECTED Final   Norovirus GI/GII NOT DETECTED NOT DETECTED Final   Rotavirus A NOT DETECTED NOT DETECTED Final   Sapovirus (I, II, IV, and V) NOT DETECTED NOT DETECTED Final    Comment: Performed at [facility], [address] [place], [place] [address]         Radiology Studies: Dg Chest Port 1 View  Result Date: 07/03/2017 CLINICAL DATA:  Cough and wheezing. EXAM: PORTABLE CHEST 1 VIEW COMPARISON:  07/01/2017 and prior exams FINDINGS: UPPER limits normal heart size noted. There is no evidence of focal airspace disease, pulmonary edema, suspicious pulmonary nodule/mass, pleural effusion, or pneumothorax. No acute bony abnormalities are identified. IMPRESSION: No active disease. Electronically Signed   By: Margarette Canada M.D.   On: 07/03/2017 20:24        Scheduled Meds: . aspirin  81 mg Oral QPM  . feeding supplement (PRO-STAT SUGAR FREE 64)  30 mL Oral BID  . fentaNYL (SUBLIMAZE) injection  50 mcg Intravenous Once  . ferrous sulfate  325 mg Oral QPM  . heparin  5,000 Units Subcutaneous Q8H  . mesalamine  2.4 g Oral Q breakfast  . metroNIDAZOLE  500 mg Oral Q8H  . potassium chloride  40 mEq Oral Once   Continuous Infusions: . cefTRIAXone (ROCEPHIN)  IV    . magnesium sulfate 1 - 4 g bolus IVPB       LOS: 3 days     Cordelia Poche, MD [facility] 07/05/2017, 8:58 AM Pager: [phone]  If 7PM-7AM, please contact night-coverage [email or web address] Password [facility] 07/05/2017, 8:58 AM

## 2017-07-05 NOTE — Care Management Note (Signed)
Case Management Note  Patient Details  Name: Bryan Ford MRN: [id number] Date of Birth: 05-Aug-1925  Subjective/Objective:                    Action/Plan: SNF recommendation. See social work note. Will continue to follow.  Expected Discharge Date:                  Expected Discharge Plan:  Skilled Nursing Facility  In-House Referral:  Clinical Social Work  Discharge planning Services     Post Acute Care Choice:    Choice offered to:     DME Arranged:    DME Agency:     HH Arranged:    [place] Agency:     Status of Service:  In process, will continue to follow  If discussed at Long Length of Stay Meetings, dates discussed:    Additional Comments:  Marilu Favre, RN 07/05/2017, 10:18 AM

## 2017-07-05 NOTE — Progress Notes (Signed)
Pt was asleep mostly this morning. Able to get up with PT and sat in the chair at lunch time. Calm and cooperative. No agitation today.

## 2017-07-06 ENCOUNTER — Inpatient Hospital Stay (HOSPITAL_COMMUNITY): Payer: Medicare Other

## 2017-07-06 ENCOUNTER — Encounter (HOSPITAL_COMMUNITY): Payer: Self-pay | Admitting: Radiology

## 2017-07-06 DIAGNOSIS — S82832A Other fracture of upper and lower end of left fibula, initial encounter for closed fracture: Secondary | ICD-10-CM | POA: Diagnosis present

## 2017-07-06 NOTE — Consult Note (Addendum)
Reason for Consult:Ankle fx Referring Physician: [name] is an 84 y.o. male.  HPI: [place] was admitted 2/22 for sepsis from a likely GI source. He also had acute confusion that has not resolved. He had fallen a couple of times prior to presentation in the ED. He was c/o ankle pain. He had an x-ray today that showed a lateral mal fx and orthopedic surgery was consulted. He has been ambulating on the ankle since admission.  Pain at his ankle is rated as moderate, worse with weightbearing, he also reports pain around his left hip and his low back.  Weightbearing is worse, elevation and rest makes it better.  Past Medical History:  Diagnosis Date  . AAA (abdominal aortic aneurysm) ([place])   . AAA (abdominal aortic aneurysm) without rupture (HCC)    Abdominal ultrasound showed an increase from 4.1 to 5.3 cm diameter AAA.  Marland Kitchen Borderline hypertension   . Chronic kidney disease, stage 3 ([place])   . ED (erectile dysfunction)   . Esophageal reflux   . History of kidney stones   . Hyperlipidemia   . Pneumonia 08-2013  . Ulcerative colitis ([place])    Takes mesalamine    Past Surgical History:  Procedure Laterality Date  . ABDOMINAL AORTIC ENDOVASCULAR STENT GRAFT N/A 12/16/2012   Procedure: ABDOMINAL AORTIC ENDOVASCULAR STENT GRAFT-GORE;  Surgeon: Bryan Mitchell, MD;  Location: [facility];  Service: Vascular;  Laterality: N/A;  Ultrasound guided  . BACK SURGERY    . ESOPHAGOGASTRODUODENOSCOPY N/A 12/26/2013   Procedure: ESOPHAGOGASTRODUODENOSCOPY (EGD);  Surgeon: Bryan Cunas., MD;  Location: [facility];  Service: Endoscopy;  Laterality: N/A;  . FRACTURE SURGERY    . KIDNEY STONE SURGERY  1970s   "never opened him up"  . MANDIBLE FRACTURE SURGERY  college  . MAXIMUM ACCESS (MAS)POSTERIOR LUMBAR INTERBODY FUSION (PLIF) 1 LEVEL  03/2000   L5-S1 laminectomy, diskectomy, posterior lumbar body fusion with decompressive lumbar laminectomy at L5-S1/notes 09/24/2010  . SAVORY DILATION N/A  12/26/2013   Procedure: SAVORY DILATION;  Surgeon: Bryan Cunas., MD;  Location: [facility];  Service: Endoscopy;  Laterality: N/A;  . TONSILLECTOMY      Family History  Problem Relation Age of Onset  . Cancer Mother     Social History:  reports that he quit smoking about 43 years ago. His smoking use included cigarettes and cigars. He quit after 20.00 years of use. he has never used smokeless tobacco. He reports that he drinks alcohol. He reports that he does not use drugs.  Allergies: No Known Allergies  Medications: I have reviewed the patient's current medications.  Results for orders placed or performed during the hospital encounter of 07/01/17 (from the past 48 hour(s))  Magnesium     Status: Abnormal   Collection Time: 07/05/17  6:21 AM  Result Value Ref Range   Magnesium 1.5 (L) 1.7 - 2.4 mg/dL    Comment: Performed at [facility] [address] [place], [place] [address]  CBC     Status: Abnormal   Collection Time: 07/05/17  6:21 AM  Result Value Ref Range   WBC 4.9 4.0 - 10.5 K/uL   RBC 3.13 (L) 4.22 - 5.81 MIL/uL   Hemoglobin 8.6 (L) 13.0 - 17.0 g/dL   HCT 26.7 (L) 39.0 - 52.0 %   MCV 85.3 78.0 - 100.0 fL   MCH 27.5 26.0 - 34.0 pg   MCHC 32.2 30.0 - 36.0 g/dL   RDW  15.2 11.5 - 15.5 %   Platelets 243 150 - 400 K/uL    Comment: Performed at [facility], [place] [address] [place], [place] [address]  Comprehensive metabolic panel     Status: Abnormal   Collection Time: 07/05/17  6:21 AM  Result Value Ref Range   Sodium 138 135 - 145 mmol/L   Potassium 3.1 (L) 3.5 - 5.1 mmol/L   Chloride 108 101 - 111 mmol/L   CO2 21 (L) 22 - 32 mmol/L   Glucose, Bld 115 (H) 65 - 99 mg/dL   BUN 20 6 - 20 mg/dL   Creatinine, Ser 1.48 (H) 0.61 - 1.24 mg/dL   Calcium 8.4 (L) 8.9 - 10.3 mg/dL   Total Protein 5.6 (L) 6.5 - 8.1 g/dL   Albumin 2.9 (L) 3.5 - 5.0 g/dL   AST 52 (H) 15 - 41 U/L   ALT 18 17 - 63 U/L   Alkaline Phosphatase 51 38 - 126 U/L   Total  Bilirubin 0.7 0.3 - 1.2 mg/dL   GFR calc non Af Amer 40 (L) >60 mL/min   GFR calc Af Amer 46 (L) >60 mL/min    Comment: (NOTE) The eGFR has been calculated using the CKD EPI equation. This calculation has not been validated in all clinical situations. eGFR's persistently <60 mL/min signify possible Chronic Kidney Disease.    Anion gap 9 5 - 15    Comment: Performed at [facility] [address], [place], [place] [address]    Dg Ankle Complete Left  Result Date: 07/06/2017 CLINICAL DATA:  Status post fall.  Pain. EXAM: LEFT ANKLE COMPLETE - 3+ VIEW COMPARISON:  None. FINDINGS: Oblique fracture of the distal fibular diaphysis with 3 mm lateral displacement and overlying soft tissue swelling. No other fracture or dislocation. Ankle mortise is intact. Small plantar calcaneal spur. IMPRESSION: 1. Oblique fracture of the distal fibular diaphysis with 3 mm lateral displacement and overlying soft tissue swelling. Electronically Signed   By: [place]   On: 07/06/2017 10:37   Ct Chest Wo Contrast  Result Date: 07/05/2017 CLINICAL DATA:  84 year old male presents with fever and tachycardia. Subsequent encounter. EXAM: CT CHEST WITHOUT CONTRAST TECHNIQUE: Multidetector CT imaging of the chest was performed following the standard protocol without IV contrast. COMPARISON:  07/05/2017 chest x-ray.  05/29/2013 chest CT. FINDINGS: Cardiovascular: Cardiomegaly. Three vessel coronary artery calcification. Trace pericardial fluid. Aortic valve calcification. Mild atherosclerotic changes aortic arch, great vessels and descending thoracic aorta. Mediastinum/Nodes: Heterogeneous thyroid gland with appearance of multinodular goiter with substernal extension on the left with slight impression upon the left lateral aspect of the trachea. Moderate-size hiatal hernia. Lungs/Pleura: Peribronchial thickening most notable lower lobe bronchi. Bilateral lower lobe subtle infiltrates/atelectasis with adjacent small  pleural effusions. Upper Abdomen: Gallstones. Musculoskeletal: No acute osseous abnormality. IMPRESSION: Peribronchial thickening most notable lower lobe bronchi bilaterally. Bilateral lower lobe subtle infiltrates/atelectasis with adjacent small pleural effusions. Cardiomegaly with 3 vessel coronary artery calcification. Heterogeneous thyroid gland with appearance of multinodular goiter with substernal extension left with slight impression left lateral aspect of the trachea. Moderate-size hiatal hernia. Gallstones. Aortic Atherosclerosis (ICD10-I70.0). Electronically Signed   By: Genia Del M.D.   On: 07/05/2017 18:44   Dg Chest Port 1 View  Result Date: 07/05/2017 CLINICAL DATA:  Increasing cough and wheezing. EXAM: PORTABLE CHEST 1 VIEW COMPARISON:  Chest x-ray dated July 03, 2017. FINDINGS: Borderline cardiomegaly, similar to prior study. Normal pulmonary vascularity. No focal consolidation, pleural effusion, or  pneumothorax. No acute osseous abnormality. IMPRESSION: No active disease. Electronically Signed   By: Titus Dubin M.D.   On: 07/05/2017 09:16    Review of Systems  Unable to perform ROS: Mental acuity   Blood pressure 140/61, pulse 66, temperature 99.1 F (37.3 C), resp. rate 16, height 5' 6"  (1.676 m), weight 73.3 kg (161 lb 9.6 oz), SpO2 96 %. Physical Exam  Constitutional: He appears well-developed and well-nourished. No distress.  HENT:  Head: Normocephalic and atraumatic.  Eyes: Conjunctivae are normal. Right eye exhibits no discharge. Left eye exhibits no discharge. No scleral icterus.  Neck: Normal range of motion.  Cardiovascular: Normal rate and regular rhythm.  Respiratory: Effort normal. No respiratory distress.  Musculoskeletal:  RLE No traumatic wounds, ecchymosis, or rash  Nontender  No knee or ankle effusion  Knee stable to varus/ valgus and anterior/posterior stress  Sens DPN, SPN, TN intact  Motor EHL, ext, flex, evers 5/5  DP 2+, PT 2+, No  significant edema  LLE No traumatic wounds, ecchymosis, or rash  TTP lateral ankle, minimal medial ankle  No knee effusion, ankle swollen  Knee stable to varus/ valgus and anterior/posterior stress  Sens DPN, SPN, TN intact  Motor EHL, ext, flex, evers 5/5  DP 2+, PT 2+  Neurological: He is alert. He is disoriented.  Skin: Skin is warm and dry. He is not diaphoretic.  Psychiatric: He has a normal mood and affect. His behavior is normal.    Assessment/Plan: Left lateral malleolus fx -- Will splint and make TTWB. Little displacement despite WB bodes well. F/u with Dr. Mardelle Matte in office in 1 weeks.  Had a discussion with his family regarding the options, both surgical versus nonsurgical.  I have recommended nonsurgical management given his medical comorbidities, his mortise is still intact from what I can tell although there is some fibular displacement, but I think functionally a slight malunion will not limit his long-term capacity.  The family has also elected for nonsurgical management.  We will also await the results of an MRI of his back, it is certainly possible he may have a acute compression fracture from his falls and his current pain.   Lisette Abu, PA-C Orthopedic Surgery [phone] 07/06/2017, 11:54 AM

## 2017-07-06 NOTE — Progress Notes (Signed)
Orthopedic Tech Progress Note Patient Details:  [place] 1926-03-25 [id number]  Ortho Devices Type of Ortho Device: Ace wrap, Post (short leg) splint Ortho Device/Splint Location: lle Ortho Device/Splint Interventions: Application   Post Interventions Patient Tolerated: Well Instructions Provided: Care of device   Hildred Priest 07/06/2017, 12:40 PM

## 2017-07-06 NOTE — Progress Notes (Addendum)
PROGRESS NOTE    [place]  OMV:[id number] DOB: [birth year] DOA: 07/01/2017 PCP: Deland Pretty, MD   Brief Narrative: [place] is a 84 y.o. malewith medical history significant forUlcerative colitis, GERD, CKD3, AAA,hyperlipidemia. He presented with confusion, fever and tachycardia. Sepsis with unknown source, but presumed GI. Workup currently unremarkable and patient still with confusion.   Assessment & Plan:   Principal Problem:   Ulcerative colitis ([place]) Active Problems:   AAA (abdominal aortic aneurysm) (HCC)   Hypertension   PAD (peripheral artery disease) (HCC)   Dyslipidemia, goal LDL below 70   Lung mass   Fall   Acute metabolic encephalopathy   CKD (chronic kidney disease), stage III (HCC)   GERD (gastroesophageal reflux disease)   Anemia of chronic disease   Chronic kidney disease, stage 3 (HCC)   Acute encephalopathy S/p fall No current evidence of infection. Patient presented with fever and tachycardia which have resolved with fluids. He has been on Cipro which may exacerbate mental status. Concern for UC flare, however, CT was not specific. Patient does have persistent coughing with wheezing. Stool output has slowed. CT chest unremarkable for infectious cause. Still altered -MRI brain -Continue Ceftriaxone/Flagyl  Addendum: Left fibular oblique fracture -Orthopedic surgery consulted. NWB until evaluated.  Cough with wheezing Previous x-ray negative. On room air. Former smoker. Chest x-ray unremarkable. CT significant for peribronchial thickening. Already on antibiotics.  CKD stage 3 Stable.  Anemia of chronic disease Stable.  GERD Stable  Severe protein calorie malnutrition Body mass index is 26.08 kg/m.  -Continue supplementation  Hyponatremia Hypomagnesemia -Supplementation  AAA Last measured 5.0 cm x 5.2 cm on CT abdomen/pelvis from 2/22. Follows with Dr. Trula Slade as an outpatient. Was supposed to have CT angio yesterday.  Recommend outpatient follow-up.   DVT prophylaxis: Heparin Code Status:   Code Status: Full Code Family Communication: Wife and daughter at bedside Disposition Plan: SNF when medically ready   Consultants:   None  Procedures:   None  Antimicrobials:  Ciprofloxacin (2/21>>2/24)  Metronidazole (2/21>>2/22; 2/25>>  Ceftriaxone (2/21;2/25>>   Subjective: No concerns  Objective: Vitals:   07/05/17 2125 07/06/17 0500 07/06/17 0510 07/06/17 0800  BP: (!) 182/75  140/84 140/61  Pulse: 75  66 66  Resp: 18  17 16   Temp: 97.8 F (36.6 C)  99.6 F (37.6 C) 99.1 F (37.3 C)  TempSrc: Oral  Oral   SpO2: 98%  96% 96%  Weight:  73.3 kg (161 lb 9.6 oz)    Height:        Intake/Output Summary (Last 24 hours) at 07/06/2017 0913 Last data filed at 07/06/2017 0836 Gross per 24 hour  Intake 518 ml  Output 1725 ml  Net -1207 ml   Filed Weights   07/04/17 0536 07/05/17 0454 07/06/17 0500  Weight: 76.5 kg (168 lb 11.2 oz) 74.1 kg (163 lb 5.8 oz) 73.3 kg (161 lb 9.6 oz)    Examination:  General exam: Appears calm and comfortable Respiratory system: Anterior auscultation clear. Respiratory effort normal. Cardiovascular system: S1 & S2 heard, RRR. No murmurs. Gastrointestinal system: Abdomen is nondistended, soft and nontender. No organomegaly or masses felt. Normal bowel sounds heard. Central nervous system: Alert and oriented to person. No focal neurological deficits. Extremities: LLE 1+ edema around ankles and lower leg without reproducible tenderness. Full range of motion of left foot/ankle. No calf tenderness Skin: No cyanosis. No rashes Psychiatry: Judgement and insight appear impaired. Flat affect.     Data Reviewed: I have personally  reviewed following labs and imaging studies  CBC: Recent Labs  Lab 07/01/17 2128 07/03/17 0551 07/04/17 0519 07/05/17 0621  WBC 9.4 6.6 6.4 4.9  NEUTROABS 8.0*  --   --   --   HGB 9.3* 8.9* 9.4* 8.6*  HCT 28.5* 28.2* 29.5*  26.7*  MCV 87.4 87.9 85.5 85.3  PLT 278 225 247 604   Basic Metabolic Panel: Recent Labs  Lab 07/01/17 2128 07/03/17 0551 07/04/17 0519 07/05/17 0621  NA 136 140 136 138  K 4.6 3.8 3.1* 3.1*  CL 101 108 107 108  CO2 24 18* 20* 21*  GLUCOSE 115* 82 109* 115*  BUN 19 23* 20 20  CREATININE 1.70* 1.74* 1.62* 1.48*  CALCIUM 8.8* 8.3* 8.2* 8.4*  MG  --   --   --  1.5*   GFR: Estimated Creatinine Clearance: 29.9 mL/min (A) (by C-G formula based on SCr of 1.48 mg/dL (H)). Liver Function Tests: Recent Labs  Lab 07/01/17 2128 07/04/17 0519 07/05/17 0621  AST 27 47* 52*  ALT 12* 16* 18  ALKPHOS 69 57 51  BILITOT 0.9 0.4 0.7  PROT 6.9 5.9* 5.6*  ALBUMIN 3.5 2.8* 2.9*   No results for input(s): LIPASE, AMYLASE in the last 168 hours. No results for input(s): AMMONIA in the last 168 hours. Coagulation Profile: No results for input(s): INR, PROTIME in the last 168 hours. Cardiac Enzymes: No results for input(s): CKTOTAL, CKMB, CKMBINDEX, TROPONINI in the last 168 hours. BNP (last 3 results) No results for input(s): PROBNP in the last 8760 hours. HbA1C: No results for input(s): HGBA1C in the last 72 hours. CBG: Recent Labs  Lab 07/01/17 2131  GLUCAP 106*   Lipid Profile: No results for input(s): CHOL, HDL, LDLCALC, TRIG, CHOLHDL, LDLDIRECT in the last 72 hours. Thyroid Function Tests: No results for input(s): TSH, T4TOTAL, FREET4, T3FREE, THYROIDAB in the last 72 hours. Anemia Panel: No results for input(s): VITAMINB12, FOLATE, FERRITIN, TIBC, IRON, RETICCTPCT in the last 72 hours. Sepsis Labs: Recent Labs  Lab 07/02/17 0530 07/02/17 0836 07/02/17 1211 07/02/17 1455  PROCALCITON 0.12  --   --   --   LATICACIDVEN 1.2 1.2 1.7 0.8    Recent Results (from the past 240 hour(s))  Urine culture     Status: None   Collection Time: 07/01/17  9:45 PM  Result Value Ref Range Status   Specimen Description URINE, CATHETERIZED  Final   Special Requests NONE  Final    Culture   Final    NO GROWTH Performed at [facility], [place] [address] [place], [place] [address]    Report Status 07/03/2017 FINAL  Final  Blood Culture (routine x 2)     Status: None (Preliminary result)   Collection Time: 07/01/17  9:54 PM  Result Value Ref Range Status   Specimen Description BLOOD RIGHT ANTECUBITAL  Final   Special Requests   Final    BOTTLES DRAWN AEROBIC AND ANAEROBIC Blood Culture adequate volume   Culture   Final    NO GROWTH 3 DAYS Performed at [facility], [place] [address] [place],  [address]    Report Status PENDING  Incomplete  Blood Culture (routine x 2)     Status: None (Preliminary result)   Collection Time: 07/01/17 10:00 PM  Result Value Ref Range Status   Specimen Description BLOOD LEFT HAND  Final   Special Requests   Final    BOTTLES DRAWN AEROBIC AND ANAEROBIC Blood Culture adequate volume  Culture   Final    NO GROWTH 3 DAYS Performed at [facility], [place] [address] [place], [place] [address]    Report Status PENDING  Incomplete  MRSA PCR Screening     Status: None   Collection Time: 07/02/17 11:51 AM  Result Value Ref Range Status   MRSA by PCR NEGATIVE NEGATIVE Final    Comment:        The GeneXpert MRSA Assay (FDA approved for NASAL specimens only), is one component of a comprehensive MRSA colonization surveillance program. It is not intended to diagnose MRSA infection nor to guide or monitor treatment for MRSA infections. Performed at [facility], [place] [address] [place], [place] [address]   Gastrointestinal Panel by PCR , Stool     Status: None   Collection Time: 07/03/17 12:53 AM  Result Value Ref Range Status   Campylobacter species NOT DETECTED NOT DETECTED Final   Plesimonas shigelloides NOT DETECTED NOT DETECTED Final   Salmonella species NOT DETECTED NOT DETECTED Final   Yersinia enterocolitica NOT DETECTED NOT DETECTED Final   Vibrio species NOT DETECTED NOT DETECTED Final   Vibrio  cholerae NOT DETECTED NOT DETECTED Final   Enteroaggregative E coli (EAEC) NOT DETECTED NOT DETECTED Final   Enteropathogenic E coli (EPEC) NOT DETECTED NOT DETECTED Final   Enterotoxigenic E coli (ETEC) NOT DETECTED NOT DETECTED Final   Shiga like toxin producing E coli (STEC) NOT DETECTED NOT DETECTED Final   Shigella/Enteroinvasive E coli (EIEC) NOT DETECTED NOT DETECTED Final   Cryptosporidium NOT DETECTED NOT DETECTED Final   Cyclospora cayetanensis NOT DETECTED NOT DETECTED Final   Entamoeba histolytica NOT DETECTED NOT DETECTED Final   Giardia lamblia NOT DETECTED NOT DETECTED Final   Adenovirus F40/41 NOT DETECTED NOT DETECTED Final   Astrovirus NOT DETECTED NOT DETECTED Final   Norovirus GI/GII NOT DETECTED NOT DETECTED Final   Rotavirus A NOT DETECTED NOT DETECTED Final   Sapovirus (I, II, IV, and V) NOT DETECTED NOT DETECTED Final    Comment: Performed at [facility], [address] [place], [place] [address]         Radiology Studies: Ct Chest Wo Contrast  Result Date: 07/05/2017 CLINICAL DATA:  84 year old male presents with fever and tachycardia. Subsequent encounter. EXAM: CT CHEST WITHOUT CONTRAST TECHNIQUE: Multidetector CT imaging of the chest was performed following the standard protocol without IV contrast. COMPARISON:  07/05/2017 chest x-ray.  05/29/2013 chest CT. FINDINGS: Cardiovascular: Cardiomegaly. Three vessel coronary artery calcification. Trace pericardial fluid. Aortic valve calcification. Mild atherosclerotic changes aortic arch, great vessels and descending thoracic aorta. Mediastinum/Nodes: Heterogeneous thyroid gland with appearance of multinodular goiter with substernal extension on the left with slight impression upon the left lateral aspect of the trachea. Moderate-size hiatal hernia. Lungs/Pleura: Peribronchial thickening most notable lower lobe bronchi. Bilateral lower lobe subtle infiltrates/atelectasis with adjacent small pleural  effusions. Upper Abdomen: Gallstones. Musculoskeletal: No acute osseous abnormality. IMPRESSION: Peribronchial thickening most notable lower lobe bronchi bilaterally. Bilateral lower lobe subtle infiltrates/atelectasis with adjacent small pleural effusions. Cardiomegaly with 3 vessel coronary artery calcification. Heterogeneous thyroid gland with appearance of multinodular goiter with substernal extension left with slight impression left lateral aspect of the trachea. Moderate-size hiatal hernia. Gallstones. Aortic Atherosclerosis (ICD10-I70.0). Electronically Signed   By: Genia Del M.D.   On: 07/05/2017 18:44   Dg Chest Port 1 View  Result Date: 07/05/2017 CLINICAL DATA:  Increasing cough and wheezing. EXAM: PORTABLE CHEST 1 VIEW COMPARISON:  Chest x-ray dated  July 03, 2017. FINDINGS: Borderline cardiomegaly, similar to prior study. Normal pulmonary vascularity. No focal consolidation, pleural effusion, or pneumothorax. No acute osseous abnormality. IMPRESSION: No active disease. Electronically Signed   By: Titus Dubin M.D.   On: 07/05/2017 09:16        Scheduled Meds: . aspirin  81 mg Oral QPM  . feeding supplement (PRO-STAT SUGAR FREE 64)  30 mL Oral BID  . fentaNYL (SUBLIMAZE) injection  50 mcg Intravenous Once  . ferrous sulfate  325 mg Oral QPM  . heparin  5,000 Units Subcutaneous Q8H  . mesalamine  2.4 g Oral Q breakfast  . metroNIDAZOLE  500 mg Oral Q8H   Continuous Infusions: . cefTRIAXone (ROCEPHIN)  IV Stopped (07/05/17 1110)     LOS: 4 days     Cordelia Poche, MD [facility] 07/06/2017, 9:13 AM Pager: [phone]  If 7PM-7AM, please contact night-coverage [email or web address] Password [facility] 07/06/2017, 9:13 AM

## 2017-07-06 NOTE — Social Work (Signed)
CSW met with pt wife and pt daughter, presented them with packet of options for SNF. Pt wife still requesting CSW reach out to [facility], but aware they should elect some back up choices just in case.   CSW continuing to follow. [place], [place] Work [phone]

## 2017-07-07 DIAGNOSIS — N183 Chronic kidney disease, stage 3 (moderate): Secondary | ICD-10-CM

## 2017-07-07 DIAGNOSIS — K5190 Ulcerative colitis, unspecified, without complications: Secondary | ICD-10-CM

## 2017-07-07 DIAGNOSIS — G9341 Metabolic encephalopathy: Principal | ICD-10-CM

## 2017-07-07 DIAGNOSIS — K529 Noninfective gastroenteritis and colitis, unspecified: Secondary | ICD-10-CM

## 2017-07-07 LAB — MAGNESIUM: Magnesium: 1.9 mg/dL (ref 1.7–2.4)

## 2017-07-07 LAB — CULTURE, BLOOD (ROUTINE X 2)
CULTURE: NO GROWTH
Culture: NO GROWTH
SPECIAL REQUESTS: ADEQUATE
Special Requests: ADEQUATE

## 2017-07-07 LAB — BASIC METABOLIC PANEL
ANION GAP: 8 (ref 5–15)
BUN: 24 mg/dL — ABNORMAL HIGH (ref 6–20)
CO2: 23 mmol/L (ref 22–32)
Calcium: 8.4 mg/dL — ABNORMAL LOW (ref 8.9–10.3)
Chloride: 106 mmol/L (ref 101–111)
Creatinine, Ser: 1.52 mg/dL — ABNORMAL HIGH (ref 0.61–1.24)
GFR calc Af Amer: 45 mL/min — ABNORMAL LOW (ref >60)
GFR, EST NON AFRICAN AMERICAN: 39 mL/min — AB (ref >60)
GLUCOSE: 119 mg/dL — AB (ref 65–99)
Potassium: 3.9 mmol/L (ref 3.5–5.1)
SODIUM: 137 mmol/L (ref 135–145)

## 2017-07-07 LAB — CBC
HCT: 25.5 % — ABNORMAL LOW (ref 39.0–52.0)
HEMOGLOBIN: 8.4 g/dL — AB (ref 13.0–17.0)
MCH: 28.6 pg (ref 26.0–34.0)
MCHC: 32.9 g/dL (ref 30.0–36.0)
MCV: 86.7 fL (ref 78.0–100.0)
Platelets: 247 10*3/uL (ref 150–400)
RBC: 2.94 MIL/uL — ABNORMAL LOW (ref 4.22–5.81)
RDW: 15.8 % — ABNORMAL HIGH (ref 11.5–15.5)
WBC: 6.2 10*3/uL (ref 4.0–10.5)

## 2017-07-07 NOTE — Social Work (Signed)
[facility] has accepted pt for placement, CSW requested facility begin insurance authorization.   CSW will continue to follow to support pt discharge when medically appropriate.   Alexander [place], [place] Work [phone]

## 2017-07-07 NOTE — Progress Notes (Signed)
Physical Therapy Treatment Patient Details Name: Bryan Ford MRN: [id number] DOB: 25-Apr-1926 Today's Date: 07/07/2017    History of Present Illness [name] is a 84 y/o male admitted on 07/01/17 due to acute confusion and diarrhea with multiple falls within the home, ulcerative colitis, L lateral malleolus fx, TTWB. Patient with a PMH significant for ulcerative colitis, GERD, CKD3, AAA.    PT Comments    Continuing work on functional mobility and activity tolerance;  Noting promising improvements in sitting balance and transfers; no gross posterior lean noted in sitting EOB and standing at RW; Still, difficulty keeping TWB LLE; Agree with SNF for post-acute rehab   Follow Up Recommendations  SNF     Equipment Recommendations  None recommended by PT    Recommendations for Other Services       Precautions / Restrictions Precautions Precautions: Fall;Other (comment) Precaution Comments: Reports multiple falls at home; safety mits on bilat hands.  Restrictions Weight Bearing Restrictions: Yes LLE Weight Bearing: Touchdown weight bearing    Mobility  Bed Mobility Overal bed mobility: Needs Assistance Bed Mobility: Rolling;Sidelying to Sit Rolling: Min assist Sidelying to sit: Min assist       General bed mobility comments: Opted for log roll technqiue due to back pain  Transfers Overall transfer level: Needs assistance Equipment used: Rolling walker (2 wheeled);2 person hand held assist Transfers: Sit to/from [facility] Sit to Stand: Mod assist;+2 physical assistance Stand pivot transfers: Mod assist;+2 physical assistance       General transfer comment: Mod +2 for lift assist and steadying assist to stand. Pt with posterior lean upon standing and required manual assist to remain upright. Mod A +2 for steadying throughout stand pivot to recliner. Cues for LE sequencing throughout.   Ambulation/Gait                 Stairs             Wheelchair Mobility    Modified Rankin (Stroke Patients Only)       Balance     Sitting balance-Leahy Scale: Fair Sitting balance - Comments: No posterior lean noted     Standing balance-Leahy Scale: Poor Standing balance comment: Stood to RW; difficulty maintianing TWB LLE                            Cognition Arousal/Alertness: Awake/alert Behavior During Therapy: WFL for tasks assessed/performed Overall Cognitive Status: Impaired/Different from baseline Area of Impairment: Orientation;Attention;Memory;Following commands;Safety/judgement;Awareness;Problem solving                                      Exercises      General Comments        Pertinent Vitals/Pain Pain Assessment: Faces Faces Pain Scale: Hurts a little bit Pain Location: grimace with movement, can be related to effort as well as L ankle pain Pain Descriptors / Indicators: Grimacing Pain Intervention(s): Monitored during session    Home Living                      Prior Function            PT Goals (current goals can now be found in the care plan section) Acute Rehab PT Goals Patient Stated Goal: family would like patient to regain strength to transfer and walk PT Goal Formulation: With patient/family Time For  Goal Achievement: 07/17/17 Potential to Achieve Goals: Good Progress towards PT goals: Progressing toward goals(Slowly)    Frequency    Min 2X/week      PT Plan Current plan remains appropriate    Co-evaluation              AM-PAC PT "6 Clicks" Daily Activity  Outcome Measure  Difficulty turning over in bed (including adjusting bedclothes, sheets and blankets)?: A Lot Difficulty moving from lying on back to sitting on the side of the bed? : A Lot Difficulty sitting down on and standing up from a chair with arms (e.g., wheelchair, bedside commode, etc,.)?: Unable Help needed moving to and from a bed to chair (including a wheelchair)?:  A Lot Help needed walking in hospital room?: Total Help needed climbing 3-5 steps with a railing? : Total 6 Click Score: 9    End of Session Equipment Utilized During Treatment: Gait belt Activity Tolerance: Patient tolerated treatment well Patient left: in chair;with call bell/phone within reach;with chair alarm set;with family/visitor present Nurse Communication: Mobility status PT Visit Diagnosis: Unsteadiness on feet (R26.81);Other abnormalities of gait and mobility (R26.89);Muscle weakness (generalized) (M62.81);History of falling (Z91.81);Difficulty in walking, not elsewhere classified (R26.2)     Time: 6122-4497 PT Time Calculation (min) (ACUTE ONLY): 28 min  Charges:  $Therapeutic Activity: 23-37 mins                    G Codes:       Roney Marion, PT  Acute Rehabilitation Services Pager [phone] Office [place] 07/07/2017, 1:22 PM

## 2017-07-07 NOTE — Progress Notes (Signed)
PROGRESS NOTE    Bryan Ford  UJW:[id number] DOB: May 11, 1926 DOA: 07/01/2017 PCP: Deland Pretty, MD      Brief Narrative:  Bryan Ford is a 84 yo M with ulcerative colitis, HTN, and CKD III who presents with confusion, fever, diarrhea.  Started on Cipro/flagyl and has slowly improved.   Assessment & Plan:  Principal Problem:   Ulcerative colitis ([place]) Active Problems:   AAA (abdominal aortic aneurysm) (HCC)   Hypertension   PAD (peripheral artery disease) (HCC)   Dyslipidemia, goal LDL below 70   Lung mass   Fall   Acute metabolic encephalopathy   CKD (chronic kidney disease), stage III (HCC)   GERD (gastroesophageal reflux disease)   Anemia of chronic disease   Chronic kidney disease, stage 3 (HCC)   Closed fracture of left distal fibula   Encephalopathy Delirium Possible normal pressure hydrocephalus MRI discussed informally with Neurology, Dr. Lorraine Lax.  It appears he may have chronic changes consistent with normal pressure hydrocephalus.  From my discussion with family, he has gotten lost driving in the past, and has problems with incontinence.    Overall his mental status is clearing considerably in the hospital. -Outpatient follow up with Neurology for eval of NPH   Diarrhea, unclear cause Presented with fever and diarrhea.  No leukocytosis to suggest Cdiff, and has improved.  GI pathogen panel negative, have to presume this is not infectious.  Initial notes state it was a presumed UC flare, but he was treated with only his home Lialda, and his symptoms resolved. -Stop antibiotics and monitor  Left fibular fracture -Follow up with Ortho in 1 week  Back pain MR lumbar spine shows severe stenosis.  There is also neuroforaminal stenosis on the right, but not on the left where his thigh/radiating pain is located.  There is no leg weakness or numbness to suggest spinal cord impingement is urgent/emergent. -If back pain persists, follow up with Ortho or Neurosurgery as  outpatient  CKD stage III Baseline Cr 1.5, stable.  Anemia of chronic disease Hgb baseline 9, stable. -Continue iron at discharge  Severe protein calorie malnutrition -Continue supplement  Ulcerative colitis -Continue Lialda  Other medications -Continue aspirin     DVT prophylaxis: Heparin Code Status: FULL Family Communication: Wife at bedside Disposition Plan: MOnitor for 24 hrs off antibiotics, if no further fever, home tomorrow to SNF.   Consultants:   None  Procedures:   None  Antimicrobials:   Ciprofloxacin, ceftriaxone, Flagyl, 7 days    Subjective: More alert today, no more fever.  Back pain stable.   No new cough, sputum production. No change in urine symptoms.  No more diarrhea.    Objective: Vitals:   07/06/17 2103 07/07/17 0500 07/07/17 0512 07/07/17 1507  BP: (!) 141/58  (!) 172/70 122/69  Pulse: 71  68 70  Resp: 18  18   Temp: 98.4 F (36.9 C)  97.7 F (36.5 C) 98.6 F (37 C)  TempSrc: Oral  Oral Oral  SpO2: 98%  98% 100%  Weight:  72.6 kg (160 lb 0.9 oz)    Height:        Intake/Output Summary (Last 24 hours) at 07/07/2017 1559 Last data filed at 07/07/2017 5621 Gross per 24 hour  Intake 600 ml  Output 1575 ml  Net -975 ml   Filed Weights   07/05/17 0454 07/06/17 0500 07/07/17 0500  Weight: 74.1 kg (163 lb 5.8 oz) 73.3 kg (161 lb 9.6 oz) 72.6 kg (160 lb 0.9 oz)  Examination: General appearance: Elderly adult male, awake, HOH, sitting in chair, interactive.     HEENT: Anicteric, conjunctiva pink, lids and lashes normal. No nasal deformity, discharge, epistaxis.  Lips moist.   Skin: Warm and dry.  No jaundice.  No suspicious rashes or lesions. Cardiac: RRR, nl S1-S2, no murmurs appreciated.  Capillary refill is brisk.  JVP normal.  No LE edema.  Respiratory: Normal respiratory rate and rhythm.  Wheezing, scattered.  Coarse large airway congestion, no rales. Abdomen: Abdomen soft.  No TTP. No ascites, distension,  hepatosplenomegaly.   MSK: No deformities or effusions. Neuro: Awake and alert.  EOMI, moves all extremities. Speech fluent.   Oriented to hospital, not which one.  Not oriented to year.  Oriented to self, wife.  Very HOH.  Leg strength good. Psych: Sensorium intact and responding to questions, attention normal. Affect .  Judgment and insight appear impaired.    Data Reviewed: I have personally reviewed following labs and imaging studies:  CBC: Recent Labs  Lab 07/01/17 2128 07/03/17 0551 07/04/17 0519 07/05/17 0621 07/07/17 0657  WBC 9.4 6.6 6.4 4.9 6.2  NEUTROABS 8.0*  --   --   --   --   HGB 9.3* 8.9* 9.4* 8.6* 8.4*  HCT 28.5* 28.2* 29.5* 26.7* 25.5*  MCV 87.4 87.9 85.5 85.3 86.7  PLT 278 225 247 243 008   Basic Metabolic Panel: Recent Labs  Lab 07/01/17 2128 07/03/17 0551 07/04/17 0519 07/05/17 0621 07/07/17 0657  NA 136 140 136 138 137  K 4.6 3.8 3.1* 3.1* 3.9  CL 101 108 107 108 106  CO2 24 18* 20* 21* 23  GLUCOSE 115* 82 109* 115* 119*  BUN 19 23* 20 20 24*  CREATININE 1.70* 1.74* 1.62* 1.48* 1.52*  CALCIUM 8.8* 8.3* 8.2* 8.4* 8.4*  MG  --   --   --  1.5* 1.9   GFR: Estimated Creatinine Clearance: 29.1 mL/min (A) (by C-G formula based on SCr of 1.52 mg/dL (H)). Liver Function Tests: Recent Labs  Lab 07/01/17 2128 07/04/17 0519 07/05/17 0621  AST 27 47* 52*  ALT 12* 16* 18  ALKPHOS 69 57 51  BILITOT 0.9 0.4 0.7  PROT 6.9 5.9* 5.6*  ALBUMIN 3.5 2.8* 2.9*   No results for input(s): LIPASE, AMYLASE in the last 168 hours. No results for input(s): AMMONIA in the last 168 hours. Coagulation Profile: No results for input(s): INR, PROTIME in the last 168 hours. Cardiac Enzymes: No results for input(s): CKTOTAL, CKMB, CKMBINDEX, TROPONINI in the last 168 hours. BNP (last 3 results) No results for input(s): PROBNP in the last 8760 hours. HbA1C: No results for input(s): HGBA1C in the last 72 hours. CBG: Recent Labs  Lab 07/01/17 2131  GLUCAP 106*    Lipid Profile: No results for input(s): CHOL, HDL, LDLCALC, TRIG, CHOLHDL, LDLDIRECT in the last 72 hours. Thyroid Function Tests: No results for input(s): TSH, T4TOTAL, FREET4, T3FREE, THYROIDAB in the last 72 hours. Anemia Panel: No results for input(s): VITAMINB12, FOLATE, FERRITIN, TIBC, IRON, RETICCTPCT in the last 72 hours. Urine analysis:    Component Value Date/Time   COLORURINE STRAW (A) 07/01/2017 2128   APPEARANCEUR CLEAR 07/01/2017 2128   LABSPEC 1.013 07/01/2017 2128   PHURINE 7.0 07/01/2017 2128   GLUCOSEU NEGATIVE 07/01/2017 2128   HGBUR NEGATIVE 07/01/2017 2128   BILIRUBINUR NEGATIVE 07/01/2017 2128   KETONESUR NEGATIVE 07/01/2017 2128   PROTEINUR NEGATIVE 07/01/2017 2128   UROBILINOGEN 0.2 11/05/2013 2253   NITRITE NEGATIVE 07/01/2017 2128  LEUKOCYTESUR NEGATIVE 07/01/2017 2128   Sepsis Labs: @LABRCNTIP (procalcitonin:4,lacticacidven:4)  ) Recent Results (from the past 240 hour(s))  Urine culture     Status: None   Collection Time: 07/01/17  9:45 PM  Result Value Ref Range Status   Specimen Description URINE, CATHETERIZED  Final   Special Requests NONE  Final   Culture   Final    NO GROWTH Performed at [facility], [address] [place], [place] [address]    Report Status 07/03/2017 FINAL  Final  Blood Culture (routine x 2)     Status: None   Collection Time: 07/01/17  9:54 PM  Result Value Ref Range Status   Specimen Description BLOOD RIGHT ANTECUBITAL  Final   Special Requests   Final    BOTTLES DRAWN AEROBIC AND ANAEROBIC Blood Culture adequate volume   Culture   Final    NO GROWTH 5 DAYS Performed at [facility], [place] [address], [place], [place] [address]    Report Status 07/07/2017 FINAL  Final  Blood Culture (routine x 2)     Status: None   Collection Time: 07/01/17 10:00 PM  Result Value Ref Range Status   Specimen Description BLOOD LEFT HAND  Final   Special Requests   Final    BOTTLES DRAWN AEROBIC AND ANAEROBIC Blood  Culture adequate volume   Culture   Final    NO GROWTH 5 DAYS Performed at [facility], [place] [address] [place], [place] [address]    Report Status 07/07/2017 FINAL  Final  MRSA PCR Screening     Status: None   Collection Time: 07/02/17 11:51 AM  Result Value Ref Range Status   MRSA by PCR NEGATIVE NEGATIVE Final    Comment:        The GeneXpert MRSA Assay (FDA approved for NASAL specimens only), is one component of a comprehensive MRSA colonization surveillance program. It is not intended to diagnose MRSA infection nor to guide or monitor treatment for MRSA infections. Performed at [facility], [place] [address] [place], [place] [address]   Gastrointestinal Panel by PCR , Stool     Status: None   Collection Time: 07/03/17 12:53 AM  Result Value Ref Range Status   Campylobacter species NOT DETECTED NOT DETECTED Final   Plesimonas shigelloides NOT DETECTED NOT DETECTED Final   Salmonella species NOT DETECTED NOT DETECTED Final   Yersinia enterocolitica NOT DETECTED NOT DETECTED Final   Vibrio species NOT DETECTED NOT DETECTED Final   Vibrio cholerae NOT DETECTED NOT DETECTED Final   Enteroaggregative E coli (EAEC) NOT DETECTED NOT DETECTED Final   Enteropathogenic E coli (EPEC) NOT DETECTED NOT DETECTED Final   Enterotoxigenic E coli (ETEC) NOT DETECTED NOT DETECTED Final   Shiga like toxin producing E coli (STEC) NOT DETECTED NOT DETECTED Final   Shigella/Enteroinvasive E coli (EIEC) NOT DETECTED NOT DETECTED Final   Cryptosporidium NOT DETECTED NOT DETECTED Final   Cyclospora cayetanensis NOT DETECTED NOT DETECTED Final   Entamoeba histolytica NOT DETECTED NOT DETECTED Final   Giardia lamblia NOT DETECTED NOT DETECTED Final   Adenovirus F40/41 NOT DETECTED NOT DETECTED Final   Astrovirus NOT DETECTED NOT DETECTED Final   Norovirus GI/GII NOT DETECTED NOT DETECTED Final   Rotavirus A NOT DETECTED NOT DETECTED Final   Sapovirus (I, II, IV, and V) NOT DETECTED  NOT DETECTED Final    Comment: Performed at [facility], [address], [place], [place] [address]  Radiology Studies: Dg Ankle Complete Left  Result Date: 07/06/2017 CLINICAL DATA:  Status post fall.  Pain. EXAM: LEFT ANKLE COMPLETE - 3+ VIEW COMPARISON:  None. FINDINGS: Oblique fracture of the distal fibular diaphysis with 3 mm lateral displacement and overlying soft tissue swelling. No other fracture or dislocation. Ankle mortise is intact. Small plantar calcaneal spur. IMPRESSION: 1. Oblique fracture of the distal fibular diaphysis with 3 mm lateral displacement and overlying soft tissue swelling. Electronically Signed   By: Kathreen Devoid   On: 07/06/2017 10:37   Ct Chest Wo Contrast  Result Date: 07/05/2017 CLINICAL DATA:  84 year old male presents with fever and tachycardia. Subsequent encounter. EXAM: CT CHEST WITHOUT CONTRAST TECHNIQUE: Multidetector CT imaging of the chest was performed following the standard protocol without IV contrast. COMPARISON:  07/05/2017 chest x-ray.  05/29/2013 chest CT. FINDINGS: Cardiovascular: Cardiomegaly. Three vessel coronary artery calcification. Trace pericardial fluid. Aortic valve calcification. Mild atherosclerotic changes aortic arch, great vessels and descending thoracic aorta. Mediastinum/Nodes: Heterogeneous thyroid gland with appearance of multinodular goiter with substernal extension on the left with slight impression upon the left lateral aspect of the trachea. Moderate-size hiatal hernia. Lungs/Pleura: Peribronchial thickening most notable lower lobe bronchi. Bilateral lower lobe subtle infiltrates/atelectasis with adjacent small pleural effusions. Upper Abdomen: Gallstones. Musculoskeletal: No acute osseous abnormality. IMPRESSION: Peribronchial thickening most notable lower lobe bronchi bilaterally. Bilateral lower lobe subtle infiltrates/atelectasis with adjacent small pleural effusions. Cardiomegaly with 3 vessel coronary  artery calcification. Heterogeneous thyroid gland with appearance of multinodular goiter with substernal extension left with slight impression left lateral aspect of the trachea. Moderate-size hiatal hernia. Gallstones. Aortic Atherosclerosis (ICD10-I70.0). Electronically Signed   By: Genia Del M.D.   On: 07/05/2017 18:44   Mr Brain Wo Contrast  Result Date: 07/06/2017 CLINICAL DATA:  84 y/o M; confusion, fever, tachycardia. Sepsis with unknown source, but presumed GI. EXAM: MRI HEAD WITHOUT CONTRAST TECHNIQUE: Multiplanar, multiecho pulse sequences of the brain and surrounding structures were obtained without intravenous contrast. COMPARISON:  07/01/2017 CT of the head. FINDINGS: Brain: No reduced diffusion to suggest acute or early subacute infarction. Nonspecific punctate foci of susceptibility hypointensity within left temporal lobe and right posterior frontal white matter compatible with hemosiderin deposition of chronic microhemorrhage. No focal mass effect. No extra-axial collection or effacement of basilar cisterns. Lateral and third ventriculomegaly disproportional to brain parenchymal volume loss, upper bowing of corpus callosum, scalloping of the lateral ventricular margin (series 7, image 15), Evans index 0.35, and periventricular T2 FLAIR signal abnormality suggesting interstitial edema. Background of severalnonspecific foci of T2 FLAIR hyperintense signal abnormality in subcortical and periventricular white matter are compatible withmoderatechronic microvascular ischemic changes for age. Moderatebrain parenchymal volume loss. Vascular: Normal flow voids. Skull and upper cervical spine: Normal marrow signal. Sinuses/Orbits: Mild anterior ethmoid and moderate right maxillary sinus mucosal thickening. No paranasal sinus fluid levels. Partial opacification of mastoid air cells. Bilateral intra-ocular lens replacement. Other: None. IMPRESSION: 1. Lateral and third ventricular enlargement  disproportional to sulcal volume with features of normal pressure hydrocephalus. Mild periventricular interstitial edema. Clinical correlation recommended. 2. No evidence for stroke, focal mass effect, or acute hemorrhage. 3. Moderate chronic microvascular ischemic changes and moderate parenchymal volume loss of the brain. 4. Mild paranasal sinus disease. Electronically Signed   By: Kristine Garbe M.D.   On: 07/06/2017 22:18   Mr Lumbar Spine Wo Contrast  Result Date: 07/06/2017 CLINICAL DATA:  Back pain.  Altered mental status. EXAM: MRI LUMBAR SPINE WITHOUT CONTRAST TECHNIQUE: Multiplanar, multisequence MR imaging of the  lumbar spine was performed. No intravenous contrast was administered. COMPARISON:  CT abdomen pelvis dated July 02, 2017. FINDINGS: Despite efforts by the technologist and patient, motion artifact is present on today's exam and could not be eliminated. This reduces exam sensitivity and specificity. Segmentation: Lumbarization of S1. The last well-formed disc space is labeled S1-S2. Alignment:  Physiologic. Vertebrae: No fracture or evidence of discitis. There is a stir hyperintense, T1 hypointense, round lesion within the L5 vertebral body. This lesion demonstrates thickened trabecula on CT, and is most consistent with a hemangioma. Conus medullaris and cauda equina: Conus extends to the L2 level. Conus and cauda equina appear normal. Paraspinal and other soft tissues: Unchanged infrarenal abdominal aortic aneurysm. Disc levels: T12-L1:  Negative. L1-L2: Small diffuse disc bulge and mild bilateral facet arthropathy. No stenosis. L2-L3: Diffuse disc bulge and mild bilateral facet arthropathy resulting in mild central spinal canal stenosis. No neuroforaminal stenosis. L3-L4: Diffuse disc bulge and moderate bilateral facet arthropathy with ligamentum flavum hypertrophy resulting in moderate central spinal canal stenosis and moderate right and mild left neuroforaminal stenosis.  L4-L5: Diffuse disc bulge and severe bilateral facet arthropathy with facet joint effusions resulting in severe central spinal canal stenosis and severe bilateral neuroforaminal stenosis. L5-S1: Disc osteophyte complex and severe bilateral facet arthropathy resulting in moderate central spinal canal stenosis and moderate right neuroforaminal stenosis. IMPRESSION: 1. Multilevel degenerative changes of the lumbar spine as described above, worst at L4-L5 where there is severe central spinal canal stenosis and severe bilateral neuroforaminal stenosis. 2. Additional moderate central spinal canal stenosis and moderate right neuroforaminal stenosis at L3-L4 and L5-S1. 3. Transitional lumbosacral anatomy with lumbarization of S1. Electronically Signed   By: Titus Dubin M.D.   On: 07/06/2017 21:18        Scheduled Meds: . aspirin  81 mg Oral QPM  . feeding supplement (PRO-STAT SUGAR FREE 64)  30 mL Oral BID  . fentaNYL (SUBLIMAZE) injection  50 mcg Intravenous Once  . ferrous sulfate  325 mg Oral QPM  . heparin  5,000 Units Subcutaneous Q8H  . mesalamine  2.4 g Oral Q breakfast  . metroNIDAZOLE  500 mg Oral Q8H   Continuous Infusions: . cefTRIAXone (ROCEPHIN)  IV Stopped (07/07/17 0900)     LOS: 5 days    Time spent: 45 minuets    Edwin Dada, MD [facility] 07/07/2017, 3:59 PM     Pager [phone] --- please page though AMION:  [email or web address] If 7PM-7AM, please contact night-coverage

## 2017-07-08 DIAGNOSIS — S82832A Other fracture of upper and lower end of left fibula, initial encounter for closed fracture: Secondary | ICD-10-CM

## 2017-07-08 DIAGNOSIS — R509 Fever, unspecified: Secondary | ICD-10-CM

## 2017-07-08 DIAGNOSIS — I10 Essential (primary) hypertension: Secondary | ICD-10-CM

## 2017-07-08 DIAGNOSIS — D638 Anemia in other chronic diseases classified elsewhere: Secondary | ICD-10-CM

## 2017-07-08 DIAGNOSIS — I714 Abdominal aortic aneurysm, without rupture: Secondary | ICD-10-CM

## 2017-07-08 DIAGNOSIS — S8262XA Displaced fracture of lateral malleolus of left fibula, initial encounter for closed fracture: Secondary | ICD-10-CM

## 2017-07-08 LAB — BASIC METABOLIC PANEL
Anion gap: 11 (ref 5–15)
BUN: 40 mg/dL — AB (ref 6–20)
CALCIUM: 8.3 mg/dL — AB (ref 8.9–10.3)
CO2: 20 mmol/L — ABNORMAL LOW (ref 22–32)
CREATININE: 1.59 mg/dL — AB (ref 0.61–1.24)
Chloride: 107 mmol/L (ref 101–111)
GFR calc non Af Amer: 37 mL/min — ABNORMAL LOW (ref >60)
GFR, EST AFRICAN AMERICAN: 42 mL/min — AB (ref >60)
Glucose, Bld: 111 mg/dL — ABNORMAL HIGH (ref 65–99)
Potassium: 3.8 mmol/L (ref 3.5–5.1)
Sodium: 138 mmol/L (ref 135–145)

## 2017-07-08 LAB — CBC
HEMATOCRIT: 24 % — AB (ref 39.0–52.0)
Hemoglobin: 7.7 g/dL — ABNORMAL LOW (ref 13.0–17.0)
MCH: 28.1 pg (ref 26.0–34.0)
MCHC: 32.1 g/dL (ref 30.0–36.0)
MCV: 87.6 fL (ref 78.0–100.0)
Platelets: 270 10*3/uL (ref 150–400)
RBC: 2.74 MIL/uL — ABNORMAL LOW (ref 4.22–5.81)
RDW: 15.9 % — AB (ref 11.5–15.5)
WBC: 8.8 10*3/uL (ref 4.0–10.5)

## 2017-07-08 NOTE — Clinical Social Work Placement (Addendum)
   [facility] Room [address] RN to call report to [phone]  Date:  07/08/2017  Patient Details  Name: Bryan Ford MRN: [id number] Date of Birth: Mar 11, 1926  Clinical Social Work is seeking post-discharge placement for this patient at the [place] level of care (*CSW will initial, date and re-position this form in  chart as items are completed):  Yes   Patient/family provided with [facility] Work Department's list of facilities offering this level of care within the geographic area requested by the patient (or if unable, by the patient's family).  Yes   Patient/family informed of their freedom to choose among providers that offer the needed level of care, that participate in Medicare, Medicaid or managed care program needed by the patient, have an available bed and are willing to accept the patient.  Yes   Patient/family informed of [facility]'s ownership interest in [facility] and [facility], as well as of the fact that they are under no obligation to receive care at these facilities.  PASRR submitted to EDS on       PASRR number received on 07/03/17     Existing PASRR number confirmed on       FL2 transmitted to all facilities in geographic area requested by pt/family on 07/03/17     FL2 transmitted to all facilities within larger geographic area on       Patient informed that his/her managed care company has contracts with or will negotiate with certain facilities, including the following:        Yes   Patient/family informed of bed offers received.  Patient chooses bed at [place], [place]     Physician recommends and patient chooses bed at      Patient to be transferred to [facility] on 07/08/17.  Patient to be transferred to facility by [facility]     Patient family notified on 07/08/17 of transfer.  Name of family member notified:  Dionysios Massman, wife      PHYSICIAN Please prepare priority discharge summary, including medications     Additional Comment:    _______________________________________________ Alexander Mt, LCSWA 07/08/2017, 2:01 PM

## 2017-07-08 NOTE — Care Management Important Message (Signed)
Important Message  Patient Details  Name: Bryan Ford MRN: [id number] Date of Birth: 01-15-1926   Medicare Important Message Given:  Yes    Orbie Pyo 07/08/2017, 12:34 PM

## 2017-07-08 NOTE — Social Work (Signed)
Clinical Social Worker facilitated patient discharge including contacting patient family and facility to confirm patient discharge plans.  Clinical information faxed to facility and family agreeable with plan.  CSW arranged ambulance transport via [facility] to [facility] [address]. RN to call [phone] with report  prior to discharge.  Clinical Social Worker will sign off for now as social work intervention is no longer needed. Please consult [place] again if new need arises.  Alexander Mt, [place] Social Worker

## 2017-07-08 NOTE — Social Work (Signed)
Pt has insurance authorization at [employer], can discharge whenever medically appropriate.   Alexander Mt, [place] Work [phone]

## 2017-07-08 NOTE — Progress Notes (Signed)
Patient given discharge instructions, wife was present and verbalized understanding, they were also made aware of follow up appointments. Report was called to Stanton Kidney, RN at [facility] prior to discharge. Patient in stable condition transported [place].

## 2017-07-08 NOTE — Discharge Summary (Addendum)
Physician Discharge Summary  Bryan Ford XBJ:[id number] DOB: May 18, 1925 DOA: 07/01/2017  PCP: Deland Pretty, MD  Admit date: 07/01/2017 Discharge date: 07/08/2017  Admitted From: Home  Disposition:  SNF   Recommendations for Outpatient Follow-up:  1. Follow up with PCP in 1-2 weeks 2. Please obtain BMP/CBC in one week 3. Please follow up with Dr. Oletta Lamas as previously scheduled 4. Please follow up with Dr. Ronnald Ramp from [facility] on Mar 5, Tues 11:30A   Home Health: N/A  Equipment/Devices: Gilford Rile  Discharge Condition: Fair  CODE STATUS: FULL Diet recommendation: Regular  Brief/Interim Summary: Mr. Turko is a 84 yo M with ulcerative colitis, HTN, and CKD III who presents with confusion, fever, as well as loose stools and cough.  Started on Cipro/flagyl and has slowly improved.    1. Fever/Cough: The patient appeared to have infection at the time of admission, with fever, tachycardia, confusion.  He was started on empiric treatment for colitis with Cipro/Flagyl.  Completed total 7 days of Cipro/Ceftriaxone + Flagyl.  Improved.    However, ultimately, a PCR panel of stool was negative for pathogens, and colitis was doubted.  CXR clear.  Urine and blood cultures negative.  Doubt pneumonia or UTI.  WBC elevation resolved.  No further fevers.  Mentation improved.  Possibly this was all viral syndrome.    2. Encephalopathy, metabolic Possible normal pressure hydrocephalus Wife reports no previous concerns about dementia, although he has gotten lost in his neighborhood driving before.  An MRI of the brain was obtained in the hospital to evaluate his persistent encephalopathy, and this showed enlarged ventricles consistent with NPH. -Refer to outpatient Neurology  3. Bowel incontinence Low back pain Ambulatory insufficiency/leg weakness Abnormal MRI back Wife states that patient has had bowel incontinence several times starting around December last.  In the same context,  he has also started to complain of worsening back pain, and she has noticed in the last few weeks that he also can only climb steps one step at a time, although he does not have chronic back pain and is active in the yard even until the last few weeks.  In the hospital, an MRI was obtained of the lumbar spine that showed severe spinal stenosis.  This imaging as well as the patient's physical exam was discussed with Dr. Ronnald Ramp from Neurosurgery who recommended outpatient follow up. -Appointment with Dr. Ronnald Ramp on Tues, Mar 5 at 11AM  4. Anemia of chronic disease, chronic iron deficiency of unknown cause Baseline appears to be 9-10.  Trended down to 7 in the hospital. -Continue iron -Follow up CBC in 3-4 weeks  5. Ulcerative colitis Was continued on his Lialda and had no frequent or painful bowel movements.  6. Severe protein calorie malnutrition  7. Ankle fracture After several days in the hospital, it was noticed that the patient had severe pain in his ankle.  X-ray showed fracture.  Likely from his fall pre-admission.  Orthopedics evaluated patient, he will follow up as outpatient.     Discharge Diagnoses:  Principal Problem:   Ulcerative colitis ([place]) Active Problems:   AAA (abdominal aortic aneurysm) (HCC)   Hypertension   PAD (peripheral artery disease) (HCC)   Dyslipidemia, goal LDL below 70   Lung mass   Fall   Acute metabolic encephalopathy   CKD (chronic kidney disease), stage III (HCC)   GERD (gastroesophageal reflux disease)   Anemia of chronic disease   Chronic kidney disease, stage 3 (HCC)   Closed fracture of left  distal fibula    Discharge Instructions  Discharge Instructions    Diet general   Complete by:  As directed    Discharge instructions   Complete by:  As directed    From Dr. Loleta Books: You were admitted for fever and cough.  There was initially concern that you had a flare of your ulcerative colitis, or possibly an infection of the colon.  You were  treated with IV fluids, antibiotics and Lialda and your symptoms improved.   You have completed treatment with antibiotics.  While you were here, you had an MRI of the brain to explain your confusion.  This showed possibly "normal pressure hydrocephalus" and so I will ask Dr. Shelia Media to make a referral to a Neurologist for evaluation.  In addition, you had the MRI of your spine that showed severe spinal stenosis (this is somewhat like advanced arthritis of the spine).  I discussed this with Dr. Ronnald Ramp from [facility], and he recommends that you follow up with him for consultation next week. -You have an appointment with Dr. Ronnald Ramp on Tues, Mar 5 at 11:30AM, please arrive from [facility] at 11:00AM.  Keep your previously scheduled appointment with Dr. Mardelle Matte for your ankle. -Touch down weight bearing only on the left ankle   Increase activity slowly   Complete by:  As directed    Partial weight bearing   Complete by:  As directed    % Body Weight:  50   Laterality:  left   Extremity:  Lower     Allergies as of 07/08/2017   No Known Allergies     Medication List    TAKE these medications   aspirin 81 MG tablet Take 81 mg by mouth every evening.   ferrous sulfate 325 (65 FE) MG tablet Take 325 mg by mouth every evening.   mesalamine 1.2 g EC tablet Commonly known as:  LIALDA Take 2.4 g by mouth daily with breakfast.            Discharge Care Instructions  (From admission, onward)        Start     Ordered   07/06/17 0000  Partial weight bearing    Question Answer Comment  % Body Weight 50   Laterality left   Extremity Lower      07/06/17 1525      Contact information for follow-up providers    Marchia Bond, MD. Schedule an appointment as soon as possible for a visit in 1 week(s).   Specialty:  Orthopedic Surgery Contact information: [address]  [place] [address] [phone]            Contact information for after-discharge  care    Destination    [facility] .   Service:  Skilled Nursing Contact information: [place] [phone]                 No Known Allergies  Consultations:  None   Procedures/Studies: Dg Chest 1 View  Result Date: 07/01/2017 CLINICAL DATA:  84 year old male with chest pain. EXAM: CHEST 1 VIEW COMPARISON:  Chest radiograph dated 11/05/2013 FINDINGS: There is mild cardiomegaly. No vascular congestion or edema minimal left lung base atelectatic changes. No focal consolidation, pleural effusion, or pneumothorax. No acute osseous pathology. IMPRESSION: Left lung base densities, likely atelectatic changes no pleural effusion or pneumothorax. Mild cardiomegaly. Electronically Signed   By: Anner Crete M.D.   On: 07/01/2017 23:41  Dg Ankle Complete Left  Result Date: 07/06/2017 CLINICAL DATA:  Status post fall.  Pain. EXAM: LEFT ANKLE COMPLETE - 3+ VIEW COMPARISON:  None. FINDINGS: Oblique fracture of the distal fibular diaphysis with 3 mm lateral displacement and overlying soft tissue swelling. No other fracture or dislocation. Ankle mortise is intact. Small plantar calcaneal spur. IMPRESSION: 1. Oblique fracture of the distal fibular diaphysis with 3 mm lateral displacement and overlying soft tissue swelling. Electronically Signed   By: Kathreen Devoid   On: 07/06/2017 10:37   Ct Head Wo Contrast  Result Date: 07/01/2017 CLINICAL DATA:  Altered mental status.  Fell in shower today. EXAM: CT HEAD WITHOUT CONTRAST CT CERVICAL SPINE WITHOUT CONTRAST TECHNIQUE: Multidetector CT imaging of the head and cervical spine was performed following the standard protocol without intravenous contrast. Multiplanar CT image reconstructions of the cervical spine were also generated. COMPARISON:  10/31/2007 head CT FINDINGS: CT HEAD FINDINGS Brain: Mild superficial and moderate to marked central atrophy with marked ventriculomegaly is  noted. A moderate degree of chronic appearing small vessel ischemic disease of periventricular white matter is identified. Age-indeterminate nonhemorrhagic infarct involving the left occipital lobe with loss of cortical-medullary distinction. No midline shift. No effacement of the basal cisterns or fourth ventricle. Vascular: Moderate atherosclerosis of the carotid siphons. No hyperdense vessels. Skull: No skull fracture. Sinuses/Orbits: Mild ethmoid and moderate right maxillary sinus mucosal thickening. Trace air-fluid level in the right maxillary sinus may represent an acute on chronic sinusitis. Right mastoid effusion with opacification noted. Left mastoid is clear. Bilateral lens replacements. Other: No significant scalp contusion or hematoma noted. CT CERVICAL SPINE FINDINGS Alignment: Maintained cervical lordosis. Skull base and vertebrae: Osteoarthritis and remodeling across the atlantodental interval. Calcification of the transverse ligament about the odontoid PEG. The clivus abuts the superior margin of the anterior arch of C1 on the lateral view. No acute vertebral fracture. Soft tissues and spinal canal: No prevertebral fluid or swelling. No visible canal hematoma. Disc levels: Marked disc space narrowing C6-7. No significant central or neural foraminal encroachment. Multilevel degenerative facet arthropathy. Upper chest: Negative. Other: None IMPRESSION: 1. Mild superficial and marked central atrophy with moderate degree of chronic appearing small vessel ischemic disease. 2. Slight loss of cortical-medullary distinction in the left occipital lobe without hemorrhage. This represents an age-indeterminate infarct. 3. No acute cervical spine fracture. No listhesis. Marked disc space narrowing C6-7. Electronically Signed   By: Ashley Royalty M.D.   On: 07/01/2017 23:45   Ct Chest Wo Contrast  Result Date: 07/05/2017 CLINICAL DATA:  84 year old male presents with fever and tachycardia. Subsequent encounter.  EXAM: CT CHEST WITHOUT CONTRAST TECHNIQUE: Multidetector CT imaging of the chest was performed following the standard protocol without IV contrast. COMPARISON:  07/05/2017 chest x-ray.  05/29/2013 chest CT. FINDINGS: Cardiovascular: Cardiomegaly. Three vessel coronary artery calcification. Trace pericardial fluid. Aortic valve calcification. Mild atherosclerotic changes aortic arch, great vessels and descending thoracic aorta. Mediastinum/Nodes: Heterogeneous thyroid gland with appearance of multinodular goiter with substernal extension on the left with slight impression upon the left lateral aspect of the trachea. Moderate-size hiatal hernia. Lungs/Pleura: Peribronchial thickening most notable lower lobe bronchi. Bilateral lower lobe subtle infiltrates/atelectasis with adjacent small pleural effusions. Upper Abdomen: Gallstones. Musculoskeletal: No acute osseous abnormality. IMPRESSION: Peribronchial thickening most notable lower lobe bronchi bilaterally. Bilateral lower lobe subtle infiltrates/atelectasis with adjacent small pleural effusions. Cardiomegaly with 3 vessel coronary artery calcification. Heterogeneous thyroid gland with appearance of multinodular goiter with substernal extension left with slight impression left lateral aspect  of the trachea. Moderate-size hiatal hernia. Gallstones. Aortic Atherosclerosis (ICD10-I70.0). Electronically Signed   By: Genia Del M.D.   On: 07/05/2017 18:44   Ct Cervical Spine Wo Contrast  Result Date: 07/01/2017 CLINICAL DATA:  Altered mental status.  Fell in shower today. EXAM: CT HEAD WITHOUT CONTRAST CT CERVICAL SPINE WITHOUT CONTRAST TECHNIQUE: Multidetector CT imaging of the head and cervical spine was performed following the standard protocol without intravenous contrast. Multiplanar CT image reconstructions of the cervical spine were also generated. COMPARISON:  10/31/2007 head CT FINDINGS: CT HEAD FINDINGS Brain: Mild superficial and moderate to marked  central atrophy with marked ventriculomegaly is noted. A moderate degree of chronic appearing small vessel ischemic disease of periventricular white matter is identified. Age-indeterminate nonhemorrhagic infarct involving the left occipital lobe with loss of cortical-medullary distinction. No midline shift. No effacement of the basal cisterns or fourth ventricle. Vascular: Moderate atherosclerosis of the carotid siphons. No hyperdense vessels. Skull: No skull fracture. Sinuses/Orbits: Mild ethmoid and moderate right maxillary sinus mucosal thickening. Trace air-fluid level in the right maxillary sinus may represent an acute on chronic sinusitis. Right mastoid effusion with opacification noted. Left mastoid is clear. Bilateral lens replacements. Other: No significant scalp contusion or hematoma noted. CT CERVICAL SPINE FINDINGS Alignment: Maintained cervical lordosis. Skull base and vertebrae: Osteoarthritis and remodeling across the atlantodental interval. Calcification of the transverse ligament about the odontoid PEG. The clivus abuts the superior margin of the anterior arch of C1 on the lateral view. No acute vertebral fracture. Soft tissues and spinal canal: No prevertebral fluid or swelling. No visible canal hematoma. Disc levels: Marked disc space narrowing C6-7. No significant central or neural foraminal encroachment. Multilevel degenerative facet arthropathy. Upper chest: Negative. Other: None IMPRESSION: 1. Mild superficial and marked central atrophy with moderate degree of chronic appearing small vessel ischemic disease. 2. Slight loss of cortical-medullary distinction in the left occipital lobe without hemorrhage. This represents an age-indeterminate infarct. 3. No acute cervical spine fracture. No listhesis. Marked disc space narrowing C6-7. Electronically Signed   By: Ashley Royalty M.D.   On: 07/01/2017 23:45   Mr Brain Wo Contrast  Result Date: 07/06/2017 CLINICAL DATA:  84 y/o M; confusion, fever,  tachycardia. Sepsis with unknown source, but presumed GI. EXAM: MRI HEAD WITHOUT CONTRAST TECHNIQUE: Multiplanar, multiecho pulse sequences of the brain and surrounding structures were obtained without intravenous contrast. COMPARISON:  07/01/2017 CT of the head. FINDINGS: Brain: No reduced diffusion to suggest acute or early subacute infarction. Nonspecific punctate foci of susceptibility hypointensity within left temporal lobe and right posterior frontal white matter compatible with hemosiderin deposition of chronic microhemorrhage. No focal mass effect. No extra-axial collection or effacement of basilar cisterns. Lateral and third ventriculomegaly disproportional to brain parenchymal volume loss, upper bowing of corpus callosum, scalloping of the lateral ventricular margin (series 7, image 15), Evans index 0.35, and periventricular T2 FLAIR signal abnormality suggesting interstitial edema. Background of severalnonspecific foci of T2 FLAIR hyperintense signal abnormality in subcortical and periventricular white matter are compatible withmoderatechronic microvascular ischemic changes for age. Moderatebrain parenchymal volume loss. Vascular: Normal flow voids. Skull and upper cervical spine: Normal marrow signal. Sinuses/Orbits: Mild anterior ethmoid and moderate right maxillary sinus mucosal thickening. No paranasal sinus fluid levels. Partial opacification of mastoid air cells. Bilateral intra-ocular lens replacement. Other: None. IMPRESSION: 1. Lateral and third ventricular enlargement disproportional to sulcal volume with features of normal pressure hydrocephalus. Mild periventricular interstitial edema. Clinical correlation recommended. 2. No evidence for stroke, focal mass effect, or acute  hemorrhage. 3. Moderate chronic microvascular ischemic changes and moderate parenchymal volume loss of the brain. 4. Mild paranasal sinus disease. Electronically Signed   By: Kristine Garbe M.D.   On: 07/06/2017  22:18   Mr Lumbar Spine Wo Contrast  Result Date: 07/06/2017 CLINICAL DATA:  Back pain.  Altered mental status. EXAM: MRI LUMBAR SPINE WITHOUT CONTRAST TECHNIQUE: Multiplanar, multisequence MR imaging of the lumbar spine was performed. No intravenous contrast was administered. COMPARISON:  CT abdomen pelvis dated July 02, 2017. FINDINGS: Despite efforts by the technologist and patient, motion artifact is present on today's exam and could not be eliminated. This reduces exam sensitivity and specificity. Segmentation: Lumbarization of S1. The last well-formed disc space is labeled S1-S2. Alignment:  Physiologic. Vertebrae: No fracture or evidence of discitis. There is a stir hyperintense, T1 hypointense, round lesion within the L5 vertebral body. This lesion demonstrates thickened trabecula on CT, and is most consistent with a hemangioma. Conus medullaris and cauda equina: Conus extends to the L2 level. Conus and cauda equina appear normal. Paraspinal and other soft tissues: Unchanged infrarenal abdominal aortic aneurysm. Disc levels: T12-L1:  Negative. L1-L2: Small diffuse disc bulge and mild bilateral facet arthropathy. No stenosis. L2-L3: Diffuse disc bulge and mild bilateral facet arthropathy resulting in mild central spinal canal stenosis. No neuroforaminal stenosis. L3-L4: Diffuse disc bulge and moderate bilateral facet arthropathy with ligamentum flavum hypertrophy resulting in moderate central spinal canal stenosis and moderate right and mild left neuroforaminal stenosis. L4-L5: Diffuse disc bulge and severe bilateral facet arthropathy with facet joint effusions resulting in severe central spinal canal stenosis and severe bilateral neuroforaminal stenosis. L5-S1: Disc osteophyte complex and severe bilateral facet arthropathy resulting in moderate central spinal canal stenosis and moderate right neuroforaminal stenosis. IMPRESSION: 1. Multilevel degenerative changes of the lumbar spine as described  above, worst at L4-L5 where there is severe central spinal canal stenosis and severe bilateral neuroforaminal stenosis. 2. Additional moderate central spinal canal stenosis and moderate right neuroforaminal stenosis at L3-L4 and L5-S1. 3. Transitional lumbosacral anatomy with lumbarization of S1. Electronically Signed   By: Titus Dubin M.D.   On: 07/06/2017 21:18   Ct Abdomen Pelvis W Contrast  Result Date: 07/02/2017 CLINICAL DATA:  84 year old male with abdominal pain and diarrhea. EXAM: CT ABDOMEN AND PELVIS WITH CONTRAST TECHNIQUE: Multidetector CT imaging of the abdomen and pelvis was performed using the standard protocol following bolus administration of intravenous contrast. CONTRAST:  ISOVUE-300 IOPAMIDOL (ISOVUE-300) INJECTION 61% COMPARISON:  Abdominal CT dated 11/11/2015 FINDINGS: Lower chest: Partially visualized probable trace pleural effusions versus pleural thickening. There are minimal bibasilar atelectasis and interstitial coarsening. Mild cardiomegaly. No intra-abdominal free air or free fluid. Hepatobiliary: Apparent fatty infiltration of the liver. No intrahepatic biliary ductal dilatation. Small gallstones. No pericholecystic fluid. Pancreas: Unremarkable. No pancreatic ductal dilatation or surrounding inflammatory changes. Spleen: The spleen is unremarkable. Small amount of fluid noted adjacent to the spleen. Adrenals/Urinary Tract: The adrenal glands are unremarkable. Probable bilateral renal cysts. There is no hydronephrosis on either side. There is symmetric enhancement and excretion of contrast by both kidneys. The visualized ureters and urinary bladder appear unremarkable Stomach/Bowel: There is sigmoid diverticulosis without active inflammatory changes. No bowel obstruction. There is a small hiatal hernia. The appendix is not visualized, likely surgically absent. Vascular/Lymphatic: There is atherosclerotic calcification of the aorta. An aorto bi-iliac endovascular stent graft  repair is seen. The stent appears patent. Evaluation of the stent is however limited due to suboptimal opacification and timing of the contrast. High  attenuating content outside of the confines of the stent within the excluded aneurysmal sac noted concerning for endoleak. Evaluation is however limited as precontrast images are not obtained. The aneurysm sac measures 5.0 x 5.2 cm in greatest axial dimensions similar to prior CT. No evidence of rupture. Reproductive: The prostate and seminal vesicles are grossly unremarkable. Other: None Musculoskeletal: Degenerative changes of the spine. No acute osseous pathology. IMPRESSION: 1. Colonic diverticulosis. No bowel obstruction or active inflammation. 2. Cholelithiasis. 3. Small amount of free fluid adjacent to the spleen of indeterminate etiology. Clinical correlation is recommended. 4. Small hiatal hernia. 5. Endovascular stent graft repair of infrarenal abdominal aortic aneurysm with findings concerning for an endoleak. No significant interval increase in the size of the excluded aneurysmal sac since the prior CT. No evidence of rupture. Electronically Signed   By: Anner Crete M.D.   On: 07/02/2017 01:16   Dg Chest Port 1 View  Result Date: 07/05/2017 CLINICAL DATA:  Increasing cough and wheezing. EXAM: PORTABLE CHEST 1 VIEW COMPARISON:  Chest x-ray dated July 03, 2017. FINDINGS: Borderline cardiomegaly, similar to prior study. Normal pulmonary vascularity. No focal consolidation, pleural effusion, or pneumothorax. No acute osseous abnormality. IMPRESSION: No active disease. Electronically Signed   By: Titus Dubin M.D.   On: 07/05/2017 09:16   Dg Chest Port 1 View  Result Date: 07/03/2017 CLINICAL DATA:  Cough and wheezing. EXAM: PORTABLE CHEST 1 VIEW COMPARISON:  07/01/2017 and prior exams FINDINGS: UPPER limits normal heart size noted. There is no evidence of focal airspace disease, pulmonary edema, suspicious pulmonary nodule/mass, pleural  effusion, or pneumothorax. No acute bony abnormalities are identified. IMPRESSION: No active disease. Electronically Signed   By: Margarette Canada M.D.   On: 07/03/2017 20:24   Dg Hip Unilat With Pelvis 2-3 Views Left  Result Date: 07/01/2017 CLINICAL DATA:  Fall with hip pain EXAM: DG HIP (WITH OR WITHOUT PELVIS) 2-3V LEFT COMPARISON:  CT 11/11/2015 FINDINGS: Partially visualized aorto iliac stent. SI joints are patent bilaterally. Pubic symphysis and rami are intact. No definite acute displaced fracture or malalignment is seen. Joint space is maintained IMPRESSION: No definite acute osseous abnormality. Electronically Signed   By: Donavan Foil M.D.   On: 07/01/2017 23:41       Subjective: Left leg tired, left ankle hurts, occasional shooting pains in hips.  No fever.  Still mild cough.  No dysuria.  Still somewhat disoriented.  Discharge Exam: Vitals:   07/07/17 2057 07/08/17 0444  BP: (!) 178/59 (!) 147/69  Pulse: 66 69  Resp: 16 16  Temp: 97.8 F (36.6 C) 98.5 F (36.9 C)  SpO2: 98% 98%   Vitals:   07/07/17 0512 07/07/17 1507 07/07/17 2057 07/08/17 0444  BP: (!) 172/70 122/69 (!) 178/59 (!) 147/69  Pulse: 68 70 66 69  Resp: 18  16 16   Temp: 97.7 F (36.5 C) 98.6 F (37 C) 97.8 F (36.6 C) 98.5 F (36.9 C)  TempSrc: Oral Oral Oral Oral  SpO2: 98% 100% 98% 98%  Weight:      Height:        General: Pt is alert, awake, not in acute distress, interactive Cardiovascular: RRR, S1/S2 +, no rubs, no gallops Respiratory: CTA bilaterally, no wheezing, no rhonchi Abdominal: Soft, NT, ND, bowel sounds + Extremities: no edema, no cyanosis Neuro: The patient is oriented to [place], but not "[facility]".  Cannot state the year.  Upper arm strength 5/5 and symmetric.  Bilateral hip flexion weak, cannot lift legs  against gravity.  However, able to stand with walker and only touch down weight bearing.  With ambulation, his right hip flexion is normal, left hip flexion is very  weak.    The results of significant diagnostics from this hospitalization (including imaging, microbiology, ancillary and laboratory) are listed below for reference.     Microbiology: Recent Results (from the past 240 hour(s))  Urine culture     Status: None   Collection Time: 07/01/17  9:45 PM  Result Value Ref Range Status   Specimen Description URINE, CATHETERIZED  Final   Special Requests NONE  Final   Culture   Final    NO GROWTH Performed at [facility], [address] [place], [place] [address]    Report Status 07/03/2017 FINAL  Final  Blood Culture (routine x 2)     Status: None   Collection Time: 07/01/17  9:54 PM  Result Value Ref Range Status   Specimen Description BLOOD RIGHT ANTECUBITAL  Final   Special Requests   Final    BOTTLES DRAWN AEROBIC AND ANAEROBIC Blood Culture adequate volume   Culture   Final    NO GROWTH 5 DAYS Performed at [facility], [place] [address] [place], [place] [address]    Report Status 07/07/2017 FINAL  Final  Blood Culture (routine x 2)     Status: None   Collection Time: 07/01/17 10:00 PM  Result Value Ref Range Status   Specimen Description BLOOD LEFT HAND  Final   Special Requests   Final    BOTTLES DRAWN AEROBIC AND ANAEROBIC Blood Culture adequate volume   Culture   Final    NO GROWTH 5 DAYS Performed at [facility], [place] [address], [place], [place] [address]    Report Status 07/07/2017 FINAL  Final  MRSA PCR Screening     Status: None   Collection Time: 07/02/17 11:51 AM  Result Value Ref Range Status   MRSA by PCR NEGATIVE NEGATIVE Final    Comment:        The GeneXpert MRSA Assay (FDA approved for NASAL specimens only), is one component of a comprehensive MRSA colonization surveillance program. It is not intended to diagnose MRSA infection nor to guide or monitor treatment for MRSA infections. Performed at [facility], [place] [address] [place], [place] [address]   Gastrointestinal Panel  by PCR , Stool     Status: None   Collection Time: 07/03/17 12:53 AM  Result Value Ref Range Status   Campylobacter species NOT DETECTED NOT DETECTED Final   Plesimonas shigelloides NOT DETECTED NOT DETECTED Final   Salmonella species NOT DETECTED NOT DETECTED Final   Yersinia enterocolitica NOT DETECTED NOT DETECTED Final   Vibrio species NOT DETECTED NOT DETECTED Final   Vibrio cholerae NOT DETECTED NOT DETECTED Final   Enteroaggregative E coli (EAEC) NOT DETECTED NOT DETECTED Final   Enteropathogenic E coli (EPEC) NOT DETECTED NOT DETECTED Final   Enterotoxigenic E coli (ETEC) NOT DETECTED NOT DETECTED Final   Shiga like toxin producing E coli (STEC) NOT DETECTED NOT DETECTED Final   Shigella/Enteroinvasive E coli (EIEC) NOT DETECTED NOT DETECTED Final   Cryptosporidium NOT DETECTED NOT DETECTED Final   Cyclospora cayetanensis NOT DETECTED NOT DETECTED Final   Entamoeba histolytica NOT DETECTED NOT DETECTED Final   Giardia lamblia NOT DETECTED NOT DETECTED Final   Adenovirus F40/41 NOT DETECTED NOT DETECTED Final   Astrovirus NOT DETECTED NOT DETECTED Final   Norovirus  GI/GII NOT DETECTED NOT DETECTED Final   Rotavirus A NOT DETECTED NOT DETECTED Final   Sapovirus (I, II, IV, and V) NOT DETECTED NOT DETECTED Final    Comment: Performed at [facility], [place] [place], [place] [address]     Labs: BNP (last 3 results) No results for input(s): BNP in the last 8760 hours. Basic Metabolic Panel: Recent Labs  Lab 07/03/17 0551 07/04/17 0519 07/05/17 0621 07/07/17 0657 07/08/17 0450  NA 140 136 138 137 138  K 3.8 3.1* 3.1* 3.9 3.8  CL 108 107 108 106 107  CO2 18* 20* 21* 23 20*  GLUCOSE 82 109* 115* 119* 111*  BUN 23* 20 20 24* 40*  CREATININE 1.74* 1.62* 1.48* 1.52* 1.59*  CALCIUM 8.3* 8.2* 8.4* 8.4* 8.3*  MG  --   --  1.5* 1.9  --    Liver Function Tests: Recent Labs  Lab 07/01/17 2128 07/04/17 0519 07/05/17 0621  AST 27 47* 52*  ALT 12* 16* 18   ALKPHOS 69 57 51  BILITOT 0.9 0.4 0.7  PROT 6.9 5.9* 5.6*  ALBUMIN 3.5 2.8* 2.9*   No results for input(s): LIPASE, AMYLASE in the last 168 hours. No results for input(s): AMMONIA in the last 168 hours. CBC: Recent Labs  Lab 07/01/17 2128 07/03/17 0551 07/04/17 0519 07/05/17 0621 07/07/17 0657 07/08/17 0450  WBC 9.4 6.6 6.4 4.9 6.2 8.8  NEUTROABS 8.0*  --   --   --   --   --   HGB 9.3* 8.9* 9.4* 8.6* 8.4* 7.7*  HCT 28.5* 28.2* 29.5* 26.7* 25.5* 24.0*  MCV 87.4 87.9 85.5 85.3 86.7 87.6  PLT 278 225 247 243 247 270   Cardiac Enzymes: No results for input(s): CKTOTAL, CKMB, CKMBINDEX, TROPONINI in the last 168 hours. BNP: Invalid input(s): POCBNP CBG: Recent Labs  Lab 07/01/17 2131  GLUCAP 106*   D-Dimer No results for input(s): DDIMER in the last 72 hours. Hgb A1c No results for input(s): HGBA1C in the last 72 hours. Lipid Profile No results for input(s): CHOL, HDL, LDLCALC, TRIG, CHOLHDL, LDLDIRECT in the last 72 hours. Thyroid function studies No results for input(s): TSH, T4TOTAL, T3FREE, THYROIDAB in the last 72 hours.  Invalid input(s): FREET3 Anemia work up No results for input(s): VITAMINB12, FOLATE, FERRITIN, TIBC, IRON, RETICCTPCT in the last 72 hours. Urinalysis    Component Value Date/Time   COLORURINE STRAW (A) 07/01/2017 2128   APPEARANCEUR CLEAR 07/01/2017 2128   LABSPEC 1.013 07/01/2017 2128   PHURINE 7.0 07/01/2017 2128   GLUCOSEU NEGATIVE 07/01/2017 2128   HGBUR NEGATIVE 07/01/2017 2128   BILIRUBINUR NEGATIVE 07/01/2017 2128   KETONESUR NEGATIVE 07/01/2017 2128   PROTEINUR NEGATIVE 07/01/2017 2128   UROBILINOGEN 0.2 11/05/2013 2253   NITRITE NEGATIVE 07/01/2017 2128   LEUKOCYTESUR NEGATIVE 07/01/2017 2128   Sepsis Labs Invalid input(s): PROCALCITONIN,  WBC,  LACTICIDVEN Microbiology Recent Results (from the past 240 hour(s))  Urine culture     Status: None   Collection Time: 07/01/17  9:45 PM  Result Value Ref Range Status    Specimen Description URINE, CATHETERIZED  Final   Special Requests NONE  Final   Culture   Final    NO GROWTH Performed at [facility], [place] [address] [place], [place] [address]    Report Status 07/03/2017 FINAL  Final  Blood Culture (routine x 2)     Status: None   Collection Time: 07/01/17  9:54 PM  Result Value Ref Range  Status   Specimen Description BLOOD RIGHT ANTECUBITAL  Final   Special Requests   Final    BOTTLES DRAWN AEROBIC AND ANAEROBIC Blood Culture adequate volume   Culture   Final    NO GROWTH 5 DAYS Performed at [facility], [address] [place], [place] [address]    Report Status 07/07/2017 FINAL  Final  Blood Culture (routine x 2)     Status: None   Collection Time: 07/01/17 10:00 PM  Result Value Ref Range Status   Specimen Description BLOOD LEFT HAND  Final   Special Requests   Final    BOTTLES DRAWN AEROBIC AND ANAEROBIC Blood Culture adequate volume   Culture   Final    NO GROWTH 5 DAYS Performed at [facility], [place] [address] [place], [place] [address]    Report Status 07/07/2017 FINAL  Final  MRSA PCR Screening     Status: None   Collection Time: 07/02/17 11:51 AM  Result Value Ref Range Status   MRSA by PCR NEGATIVE NEGATIVE Final    Comment:        The GeneXpert MRSA Assay (FDA approved for NASAL specimens only), is one component of a comprehensive MRSA colonization surveillance program. It is not intended to diagnose MRSA infection nor to guide or monitor treatment for MRSA infections. Performed at [facility], [place] [address] [place], [place] [address]   Gastrointestinal Panel by PCR , Stool     Status: None   Collection Time: 07/03/17 12:53 AM  Result Value Ref Range Status   Campylobacter species NOT DETECTED NOT DETECTED Final   Plesimonas shigelloides NOT DETECTED NOT DETECTED Final   Salmonella species NOT DETECTED NOT DETECTED Final   Yersinia enterocolitica NOT DETECTED NOT DETECTED Final   Vibrio  species NOT DETECTED NOT DETECTED Final   Vibrio cholerae NOT DETECTED NOT DETECTED Final   Enteroaggregative E coli (EAEC) NOT DETECTED NOT DETECTED Final   Enteropathogenic E coli (EPEC) NOT DETECTED NOT DETECTED Final   Enterotoxigenic E coli (ETEC) NOT DETECTED NOT DETECTED Final   Shiga like toxin producing E coli (STEC) NOT DETECTED NOT DETECTED Final   Shigella/Enteroinvasive E coli (EIEC) NOT DETECTED NOT DETECTED Final   Cryptosporidium NOT DETECTED NOT DETECTED Final   Cyclospora cayetanensis NOT DETECTED NOT DETECTED Final   Entamoeba histolytica NOT DETECTED NOT DETECTED Final   Giardia lamblia NOT DETECTED NOT DETECTED Final   Adenovirus F40/41 NOT DETECTED NOT DETECTED Final   Astrovirus NOT DETECTED NOT DETECTED Final   Norovirus GI/GII NOT DETECTED NOT DETECTED Final   Rotavirus A NOT DETECTED NOT DETECTED Final   Sapovirus (I, II, IV, and V) NOT DETECTED NOT DETECTED Final    Comment: Performed at [facility], [address], [place], [place] [address]     Time coordinating discharge: 50 minutes minutes  SIGNED:   Edwin Dada, MD  [facility] 07/08/2017, 2:17 PM

## 2017-09-09 ENCOUNTER — Encounter (HOSPITAL_COMMUNITY): Payer: Self-pay | Admitting: Emergency Medicine

## 2017-09-09 ENCOUNTER — Emergency Department (HOSPITAL_COMMUNITY)
Admission: EM | Admit: 2017-09-09 | Discharge: 2017-09-09 | Disposition: A | Discharge status: Home / Self Care | Payer: Medicare Other | Attending: Emergency Medicine | Admitting: Emergency Medicine

## 2017-09-09 DIAGNOSIS — Z79899 Other long term (current) drug therapy: Secondary | ICD-10-CM | POA: Diagnosis not present

## 2017-09-09 DIAGNOSIS — N183 Chronic kidney disease, stage 3 (moderate): Secondary | ICD-10-CM | POA: Diagnosis not present

## 2017-09-09 DIAGNOSIS — K644 Residual hemorrhoidal skin tags: Secondary | ICD-10-CM | POA: Insufficient documentation

## 2017-09-09 DIAGNOSIS — Z87891 Personal history of nicotine dependence: Secondary | ICD-10-CM | POA: Insufficient documentation

## 2017-09-09 DIAGNOSIS — D649 Anemia, unspecified: Secondary | ICD-10-CM | POA: Insufficient documentation

## 2017-09-09 DIAGNOSIS — I129 Hypertensive chronic kidney disease with stage 1 through stage 4 chronic kidney disease, or unspecified chronic kidney disease: Secondary | ICD-10-CM | POA: Diagnosis not present

## 2017-09-09 LAB — COMPREHENSIVE METABOLIC PANEL
ALBUMIN: 3.4 g/dL — AB (ref 3.5–5.0)
ALT: 9 U/L — ABNORMAL LOW (ref 17–63)
ANION GAP: 9 (ref 5–15)
AST: 17 U/L (ref 15–41)
Alkaline Phosphatase: 56 U/L (ref 38–126)
BILIRUBIN TOTAL: 0.6 mg/dL (ref 0.3–1.2)
BUN: 24 mg/dL — AB (ref 6–20)
CHLORIDE: 109 mmol/L (ref 101–111)
CO2: 25 mmol/L (ref 22–32)
Calcium: 9.3 mg/dL (ref 8.9–10.3)
Creatinine, Ser: 1.63 mg/dL — ABNORMAL HIGH (ref 0.61–1.24)
GFR calc Af Amer: 41 mL/min — ABNORMAL LOW (ref >60)
GFR calc non Af Amer: 35 mL/min — ABNORMAL LOW (ref >60)
GLUCOSE: 112 mg/dL — AB (ref 65–99)
POTASSIUM: 4.2 mmol/L (ref 3.5–5.1)
SODIUM: 143 mmol/L (ref 135–145)
TOTAL PROTEIN: 7.2 g/dL (ref 6.5–8.1)

## 2017-09-09 LAB — CBC WITH DIFFERENTIAL/PLATELET
BASOS ABS: 0.1 10*3/uL (ref 0.0–0.1)
Basophils Relative: 1 %
EOS PCT: 5 %
Eosinophils Absolute: 0.5 10*3/uL (ref 0.0–0.7)
HEMATOCRIT: 24.9 % — AB (ref 39.0–52.0)
Hemoglobin: 7.8 g/dL — ABNORMAL LOW (ref 13.0–17.0)
LYMPHS ABS: 2.6 10*3/uL (ref 0.7–4.0)
Lymphocytes Relative: 22 %
MCH: 26.4 pg (ref 26.0–34.0)
MCHC: 31.3 g/dL (ref 30.0–36.0)
MCV: 84.1 fL (ref 78.0–100.0)
MONO ABS: 0.6 10*3/uL (ref 0.1–1.0)
MONOS PCT: 5 %
NEUTROS ABS: 7.8 10*3/uL — AB (ref 1.7–7.7)
Neutrophils Relative %: 67 %
PLATELETS: 404 10*3/uL — AB (ref 150–400)
RBC: 2.96 MIL/uL — ABNORMAL LOW (ref 4.22–5.81)
RDW: 16 % — AB (ref 11.5–15.5)
WBC: 11.6 10*3/uL — ABNORMAL HIGH (ref 4.0–10.5)

## 2017-09-09 LAB — LIPASE, BLOOD: Lipase: 28 U/L (ref 11–51)

## 2017-09-09 MED ORDER — FERROUS SULFATE 325 (65 FE) MG PO TABS: 325.0000 mg | ORAL_TABLET | Freq: Every day | ORAL | 0 refills | Status: AC

## 2017-09-09 NOTE — ED Triage Notes (Signed)
Pt sent from Dr Shelia Media for re evaluate labs bc ones done at the office were not good.

## 2017-09-09 NOTE — ED Provider Notes (Signed)
[facility] DEPT Provider Note   CSN: [id number] Arrival date & time: 09/09/17  1425     History   Chief Complaint Chief Complaint  Patient presents with  . abnormal labs    HPI Bryan Ford is a 84 y.o. male.  HPI   84 year old male with history below presents with concern for anemia, sent by his primary care physician's office.  Patient reports he is asymptomatic.  Denies any lightheadedness, syncope, chest pain, shortness of breath.  Denies black or bloody stools, hematuria, hematemesis or hemoptysis.  He did have one episode last week of bright red blood seen in his depends, small amount, one time.  Denies any blood mixed with stool, maroon-colored stool or blood clots colored stool.  On chart review, patient noted to have a history of anemia, which was assumed to be anemia of chronic disease in his most recent hospitalization, at which point his hemoglobin had decreased down to 7.7.  Today his hemoglobin is 7.8.   Past Medical History:  Diagnosis Date  . AAA (abdominal aortic aneurysm) ([facility])   . AAA (abdominal aortic aneurysm) without rupture ([facility])    Abdominal ultrasound showed an increase from 4.1 to 5.3 cm diameter AAA.  Marland Kitchen Borderline hypertension   . Chronic kidney disease, stage 3 ([facility])   . ED (erectile dysfunction)   . Esophageal reflux   . History of kidney stones   . Hyperlipidemia   . Pneumonia 08-2013  . Ulcerative colitis ([facility])    Takes mesalamine    Patient Active Problem List   Diagnosis Date Noted  . Closed fracture of left distal fibula 07/06/2017  . Chronic kidney disease, stage 3 ([facility])   . Fall 07/02/2017  . Acute metabolic encephalopathy 80/16/5537  . Ulcerative colitis ([place]) 07/02/2017  . CKD (chronic kidney disease), stage III ([place]) 07/02/2017  . GERD (gastroesophageal reflux disease) 07/02/2017  . Anemia of chronic disease 07/02/2017  . AAA (abdominal aortic aneurysm) without rupture ([place]) 03/13/2013  . Lung  mass 03/13/2013  . Back pain 12/23/2012  . Abdominal aneurysm without mention of rupture 11/21/2012  . AAA (abdominal aortic aneurysm) ([place]) 11/07/2012  . Hypertension 11/07/2012  . PAD (peripheral artery disease) ([place]) 11/07/2012  . Dyslipidemia, goal LDL below 70 11/07/2012    Past Surgical History:  Procedure Laterality Date  . ABDOMINAL AORTIC ENDOVASCULAR STENT GRAFT N/A 12/16/2012   Procedure: ABDOMINAL AORTIC ENDOVASCULAR STENT GRAFT-GORE;  Surgeon: Serafina Mitchell, MD;  Location: [facility];  Service: Vascular;  Laterality: N/A;  Ultrasound guided  . BACK SURGERY    . ESOPHAGOGASTRODUODENOSCOPY N/A 12/26/2013   Procedure: ESOPHAGOGASTRODUODENOSCOPY (EGD);  Surgeon: Winfield Cunas., MD;  Location: [facility];  Service: Endoscopy;  Laterality: N/A;  . FRACTURE SURGERY    . KIDNEY STONE SURGERY  1970s   "never opened him up"  . MANDIBLE FRACTURE SURGERY  college  . MAXIMUM ACCESS (MAS)POSTERIOR LUMBAR INTERBODY FUSION (PLIF) 1 LEVEL  03/2000   L5-S1 laminectomy, diskectomy, posterior lumbar body fusion with decompressive lumbar laminectomy at L5-S1/notes 09/24/2010  . SAVORY DILATION N/A 12/26/2013   Procedure: SAVORY DILATION;  Surgeon: Winfield Cunas., MD;  Location: [facility];  Service: Endoscopy;  Laterality: N/A;  . TONSILLECTOMY          Home Medications    Prior to Admission medications   Medication Sig Start Date End Date Taking? Authorizing Provider  Ascorbic Acid (VITAMIN C) 500 MG CHEW Chew 1 tablet by mouth daily.  Yes [provider]  Cholecalciferol (VITAMIN D3) 1000 units CHEW Chew 1 tablet by mouth daily.   Yes [provider]  dextromethorphan-guaiFENesin (MUCINEX DM) 30-600 MG 12hr tablet Take 1 tablet by mouth 2 (two) times daily as needed for cough.   Yes [provider]  mesalamine (LIALDA) 1.2 G EC tablet Take 2.4 g by mouth daily with breakfast.    Yes [provider]  ferrous sulfate 325 (65 FE) MG tablet Take  1 tablet (325 mg total) by mouth daily. 09/09/17   Gareth Morgan, MD    Family History Family History  Problem Relation Age of Onset  . Cancer Mother     Social History Social History   Tobacco Use  . Smoking status: Former Smoker    Years: 20.00    Types: Cigarettes, Cigars    Last attempt to quit: 11/01/1973    Years since quitting: 43.8  . Smokeless tobacco: Never Used  Substance Use Topics  . Alcohol use: Yes    Comment: 07/02/2017 occasionally, but not every week  . Drug use: No     Allergies   Patient has no known allergies.   Review of Systems Review of Systems  Constitutional: Negative for fatigue and fever.  Eyes: Negative for visual disturbance.  Respiratory: Negative for shortness of breath.   Cardiovascular: Negative for chest pain.  Gastrointestinal: Positive for anal bleeding (had one episode last week of bright red blood that resolved). Negative for abdominal pain, constipation, nausea and vomiting.  Genitourinary: Negative for difficulty urinating and hematuria.  Musculoskeletal: Negative for back pain and neck stiffness.  Skin: Negative for rash.  Neurological: Negative for syncope, light-headedness and headaches.     Physical Exam Updated Vital Signs BP (!) 141/76 (BP Location: Left Arm)   Pulse 76   Temp 97.9 F (36.6 C) (Oral)   Resp 18   SpO2 98%   Physical Exam  Constitutional: He is oriented to person, place, and time. He appears well-developed and well-nourished. No distress.  HENT:  Head: Normocephalic and atraumatic.  Eyes: Conjunctivae and EOM are normal.  Neck: Normal range of motion.  Cardiovascular: Normal rate, regular rhythm, normal heart sounds and intact distal pulses. Exam reveals no gallop and no friction rub.  No murmur heard. Pulmonary/Chest: Effort normal and breath sounds normal. No respiratory distress. He has no wheezes. He has no rales.  Abdominal: Soft. He exhibits no distension. There is no tenderness. There is  no guarding.  Genitourinary: Rectal exam shows external hemorrhoid.  Musculoskeletal: He exhibits no edema.  Neurological: He is alert and oriented to person, place, and time.  Skin: Skin is warm and dry. He is not diaphoretic.  Nursing note and vitals reviewed.    ED Treatments / Results  Labs (all labs ordered are listed, but only abnormal results are displayed) Labs Reviewed  COMPREHENSIVE METABOLIC PANEL - Abnormal; Notable for the following components:      Result Value   Glucose, Bld 112 (*)    BUN 24 (*)    Creatinine, Ser 1.63 (*)    Albumin 3.4 (*)    ALT 9 (*)    GFR calc non Af Amer 35 (*)    GFR calc Af Amer 41 (*)    All other components within normal limits  CBC WITH DIFFERENTIAL/PLATELET - Abnormal; Notable for the following components:   WBC 11.6 (*)    RBC 2.96 (*)    Hemoglobin 7.8 (*)    HCT 24.9 (*)  RDW 16.0 (*)    Platelets 404 (*)    Neutro Abs 7.8 (*)    All other components within normal limits  LIPASE, BLOOD    EKG None  Radiology No results found.  Procedures Procedures (including critical care time)  Medications Ordered in ED Medications - No data to display   Initial Impression / Assessment and Plan / ED Course  I have reviewed the triage vital signs and the nursing notes.  Pertinent labs & imaging results that were available during my care of the patient were reviewed by me and considered in my medical decision making (see chart for details).     84 year old male with history above presents with concern for anemia, sent by his primary care physician's office.  Patient's hemoglobin was 7.7 on last recheck in our system in February, and family reports he has not had his hemoglobin rechecked since he was in the hospital until today. Today is 7.8. Patient is asymptomatic, without signs of significant symptomatic anemia, and has no signs of acute bleed.  Rectal exam done without significant sample which was hemoccult negative on my  exam although is difficult to confirm accuracy given small sample. Pt does have hemorrhoids which likely contributed to episode last week. He is not having symptoms of UC flare.  Do not see need for transfusion given hgb greater than 7, chronic, no acute bleeding and asymptomatic.  Recommend starting iron, having outpatient anemia work up. Called PCP and left message and provided copy of labs for patient to bring to PCP. Patient discharged in stable condition with understanding of reasons to return.   Final Clinical Impressions(s) / ED Diagnoses   Final diagnoses:  Anemia, unspecified type    ED Discharge Orders        Ordered    ferrous sulfate 325 (65 FE) MG tablet  Daily     09/09/17 1644       Gareth Morgan, MD 09/09/17 1656

## 2018-06-16 ENCOUNTER — Observation Stay (HOSPITAL_COMMUNITY)
Admission: EM | Admit: 2018-06-16 | Discharge: 2018-06-17 | Disposition: A | Discharge status: Home Health | Payer: Medicare Other | Attending: Internal Medicine | Admitting: Internal Medicine

## 2018-06-16 ENCOUNTER — Emergency Department (HOSPITAL_COMMUNITY): Payer: Medicare Other

## 2018-06-16 ENCOUNTER — Encounter (HOSPITAL_COMMUNITY): Payer: Self-pay

## 2018-06-16 DIAGNOSIS — E785 Hyperlipidemia, unspecified: Secondary | ICD-10-CM | POA: Diagnosis present

## 2018-06-16 DIAGNOSIS — M6281 Muscle weakness (generalized): Secondary | ICD-10-CM | POA: Insufficient documentation

## 2018-06-16 DIAGNOSIS — R296 Repeated falls: Secondary | ICD-10-CM | POA: Diagnosis not present

## 2018-06-16 DIAGNOSIS — I739 Peripheral vascular disease, unspecified: Secondary | ICD-10-CM | POA: Diagnosis not present

## 2018-06-16 DIAGNOSIS — I714 Abdominal aortic aneurysm, without rupture, unspecified: Secondary | ICD-10-CM | POA: Diagnosis present

## 2018-06-16 DIAGNOSIS — R2681 Unsteadiness on feet: Secondary | ICD-10-CM | POA: Insufficient documentation

## 2018-06-16 DIAGNOSIS — Y9301 Activity, walking, marching and hiking: Secondary | ICD-10-CM | POA: Insufficient documentation

## 2018-06-16 DIAGNOSIS — K219 Gastro-esophageal reflux disease without esophagitis: Secondary | ICD-10-CM | POA: Diagnosis not present

## 2018-06-16 DIAGNOSIS — Z87891 Personal history of nicotine dependence: Secondary | ICD-10-CM | POA: Diagnosis not present

## 2018-06-16 DIAGNOSIS — R5381 Other malaise: Secondary | ICD-10-CM | POA: Diagnosis not present

## 2018-06-16 DIAGNOSIS — Z66 Do not resuscitate: Secondary | ICD-10-CM | POA: Diagnosis not present

## 2018-06-16 DIAGNOSIS — S2242XA Multiple fractures of ribs, left side, initial encounter for closed fracture: Secondary | ICD-10-CM | POA: Diagnosis present

## 2018-06-16 DIAGNOSIS — I129 Hypertensive chronic kidney disease with stage 1 through stage 4 chronic kidney disease, or unspecified chronic kidney disease: Secondary | ICD-10-CM | POA: Insufficient documentation

## 2018-06-16 DIAGNOSIS — D631 Anemia in chronic kidney disease: Secondary | ICD-10-CM | POA: Insufficient documentation

## 2018-06-16 DIAGNOSIS — R609 Edema, unspecified: Secondary | ICD-10-CM | POA: Insufficient documentation

## 2018-06-16 DIAGNOSIS — W010XXA Fall on same level from slipping, tripping and stumbling without subsequent striking against object, initial encounter: Secondary | ICD-10-CM | POA: Insufficient documentation

## 2018-06-16 DIAGNOSIS — I10 Essential (primary) hypertension: Secondary | ICD-10-CM

## 2018-06-16 DIAGNOSIS — R2689 Other abnormalities of gait and mobility: Secondary | ICD-10-CM | POA: Insufficient documentation

## 2018-06-16 DIAGNOSIS — Z9181 History of falling: Secondary | ICD-10-CM | POA: Insufficient documentation

## 2018-06-16 DIAGNOSIS — Z79899 Other long term (current) drug therapy: Secondary | ICD-10-CM | POA: Diagnosis not present

## 2018-06-16 DIAGNOSIS — N183 Chronic kidney disease, stage 3 unspecified: Secondary | ICD-10-CM | POA: Diagnosis present

## 2018-06-16 DIAGNOSIS — I491 Atrial premature depolarization: Secondary | ICD-10-CM | POA: Insufficient documentation

## 2018-06-16 DIAGNOSIS — W19XXXA Unspecified fall, initial encounter: Secondary | ICD-10-CM

## 2018-06-16 DIAGNOSIS — S2249XA Multiple fractures of ribs, unspecified side, initial encounter for closed fracture: Secondary | ICD-10-CM | POA: Diagnosis present

## 2018-06-16 DIAGNOSIS — K5190 Ulcerative colitis, unspecified, without complications: Secondary | ICD-10-CM | POA: Diagnosis not present

## 2018-06-16 LAB — CBC
HCT: 30.3 % — ABNORMAL LOW (ref 39.0–52.0)
Hemoglobin: 9.1 g/dL — ABNORMAL LOW (ref 13.0–17.0)
MCH: 28.7 pg (ref 26.0–34.0)
MCHC: 30 g/dL (ref 30.0–36.0)
MCV: 95.6 fL (ref 80.0–100.0)
NRBC: 0 % (ref 0.0–0.2)
PLATELETS: 293 10*3/uL (ref 150–400)
RBC: 3.17 MIL/uL — ABNORMAL LOW (ref 4.22–5.81)
RDW: 14.2 % (ref 11.5–15.5)
WBC: 9.2 10*3/uL (ref 4.0–10.5)

## 2018-06-16 LAB — COMPREHENSIVE METABOLIC PANEL
ALBUMIN: 3 g/dL — AB (ref 3.5–5.0)
ALT: 12 U/L (ref 0–44)
AST: 14 U/L — AB (ref 15–41)
Alkaline Phosphatase: 42 U/L (ref 38–126)
Anion gap: 11 (ref 5–15)
BUN: 22 mg/dL (ref 8–23)
CHLORIDE: 112 mmol/L — AB (ref 98–111)
CO2: 21 mmol/L — AB (ref 22–32)
Calcium: 8.7 mg/dL — ABNORMAL LOW (ref 8.9–10.3)
Creatinine, Ser: 1.65 mg/dL — ABNORMAL HIGH (ref 0.61–1.24)
GFR calc Af Amer: 41 mL/min — ABNORMAL LOW (ref >60)
GFR calc non Af Amer: 36 mL/min — ABNORMAL LOW (ref >60)
GLUCOSE: 114 mg/dL — AB (ref 70–99)
POTASSIUM: 3.3 mmol/L — AB (ref 3.5–5.1)
SODIUM: 144 mmol/L (ref 135–145)
Total Bilirubin: 0.6 mg/dL (ref 0.3–1.2)
Total Protein: 6.4 g/dL — ABNORMAL LOW (ref 6.5–8.1)

## 2018-06-16 LAB — BRAIN NATRIURETIC PEPTIDE: B Natriuretic Peptide: 143.5 pg/mL — ABNORMAL HIGH (ref 0.0–100.0)

## 2018-06-16 LAB — URINALYSIS, ROUTINE W REFLEX MICROSCOPIC
Bilirubin Urine: NEGATIVE
GLUCOSE, UA: NEGATIVE mg/dL
HGB URINE DIPSTICK: NEGATIVE
Ketones, ur: NEGATIVE mg/dL
LEUKOCYTES UA: NEGATIVE
Nitrite: NEGATIVE
Protein, ur: NEGATIVE mg/dL
SPECIFIC GRAVITY, URINE: 1.016 (ref 1.005–1.030)
pH: 5 (ref 5.0–8.0)

## 2018-06-16 MED ORDER — HYDRALAZINE HCL 20 MG/ML IJ SOLN
10.0000 mg | Freq: Once | INTRAMUSCULAR | Status: AC
  Administered 2018-06-16: 10 mg via INTRAVENOUS
  Filled 2018-06-16: qty 1

## 2018-06-16 MED ORDER — DOCUSATE SODIUM 100 MG PO CAPS
100.0000 mg | ORAL_CAPSULE | Freq: Two times a day (BID) | ORAL | Status: DC
  Administered 2018-06-16 – 2018-06-17 (×2): 100 mg via ORAL
  Filled 2018-06-16 (×4): qty 1

## 2018-06-16 MED ORDER — MESALAMINE 1.2 G PO TBEC
2.4000 g | DELAYED_RELEASE_TABLET | Freq: Every day | ORAL | Status: DC
  Administered 2018-06-17: 2.4 g via ORAL
  Filled 2018-06-16: qty 2

## 2018-06-16 MED ORDER — IOHEXOL 300 MG/ML  SOLN
80.0000 mL | Freq: Once | INTRAMUSCULAR | Status: AC | PRN
  Administered 2018-06-16: 80 mL via INTRAVENOUS

## 2018-06-16 MED ORDER — POTASSIUM CHLORIDE CRYS ER 20 MEQ PO TBCR
40.0000 meq | EXTENDED_RELEASE_TABLET | Freq: Once | ORAL | Status: AC
  Administered 2018-06-16: 40 meq via ORAL
  Filled 2018-06-16: qty 2

## 2018-06-16 MED ORDER — ACETAMINOPHEN 325 MG PO TABS: 650.0000 mg | ORAL_TABLET | Freq: Four times a day (QID) | ORAL | Status: DC | PRN

## 2018-06-16 MED ORDER — ALBUTEROL SULFATE (2.5 MG/3ML) 0.083% IN NEBU: 2.5000 mg | INHALATION_SOLUTION | RESPIRATORY_TRACT | Status: DC | PRN

## 2018-06-16 MED ORDER — FERROUS SULFATE 325 (65 FE) MG PO TABS
325.0000 mg | ORAL_TABLET | Freq: Every day | ORAL | Status: DC
  Administered 2018-06-16 – 2018-06-17 (×2): 325 mg via ORAL
  Filled 2018-06-16 (×2): qty 1

## 2018-06-16 MED ORDER — HALOPERIDOL LACTATE 5 MG/ML IJ SOLN
5.0000 mg | Freq: Once | INTRAMUSCULAR | Status: AC
  Administered 2018-06-16: 5 mg via INTRAVENOUS
  Filled 2018-06-16: qty 1

## 2018-06-16 MED ORDER — VITAMIN C 500 MG PO TABS
500.0000 mg | ORAL_TABLET | Freq: Every day | ORAL | Status: DC
  Administered 2018-06-17: 500 mg via ORAL
  Filled 2018-06-16: qty 1

## 2018-06-16 MED ORDER — MORPHINE SULFATE (PF) 4 MG/ML IV SOLN: 4.0000 mg | Freq: Once | INTRAVENOUS | Status: DC

## 2018-06-16 MED ORDER — FENTANYL CITRATE (PF) 100 MCG/2ML IJ SOLN
50.0000 ug | Freq: Once | INTRAMUSCULAR | Status: AC
  Administered 2018-06-16: 50 ug via INTRAVENOUS
  Filled 2018-06-16: qty 2

## 2018-06-16 MED ORDER — VITAMIN D 25 MCG (1000 UNIT) PO TABS
1000.0000 [IU] | ORAL_TABLET | Freq: Every day | ORAL | Status: DC
  Administered 2018-06-17: 1000 [IU] via ORAL
  Filled 2018-06-16: qty 1

## 2018-06-16 MED ORDER — BACITRACIN ZINC 500 UNIT/GM EX OINT
TOPICAL_OINTMENT | Freq: Two times a day (BID) | CUTANEOUS | Status: DC
  Administered 2018-06-16 – 2018-06-17 (×3): via TOPICAL
  Filled 2018-06-16 (×2): qty 28.4

## 2018-06-16 MED ORDER — ENOXAPARIN SODIUM 30 MG/0.3ML ~~LOC~~ SOLN
30.0000 mg | SUBCUTANEOUS | Status: DC
  Administered 2018-06-16: 30 mg via SUBCUTANEOUS
  Filled 2018-06-16 (×2): qty 0.3

## 2018-06-16 MED ORDER — OXYCODONE HCL 5 MG PO TABS
5.0000 mg | ORAL_TABLET | ORAL | Status: DC | PRN
  Administered 2018-06-16 – 2018-06-17 (×2): 5 mg via ORAL
  Filled 2018-06-16 (×2): qty 1

## 2018-06-16 NOTE — ED Notes (Signed)
Admitting at bedside 

## 2018-06-16 NOTE — ED Provider Notes (Signed)
  Physical Exam  BP (!) 178/85   Pulse 68   Temp 98.3 F (36.8 C) (Oral)   Resp 18   Ht 5' 8"  (1.727 m)   Wt 77.1 kg   SpO2 96%   BMI 25.85 kg/m   Physical Exam  ED Course/Procedures     Procedures  MDM  Care assumed at 7 am from Dr. Sedonia Small.  Patient has known AAA and had a fall about a week ago.  His chest x-ray outpatient was unremarkable and his rib x-rays today show pleural effusion.  He had some left upper quadrant tenderness so signed out pending CT abdomen pelvis.  8:46 AM  CT ab/pel showed multiple rib fractures. CT chest confirmed L 5th to 10 rib fractures. Patient is in severe pain. Patient also had endoleak that is treated with a graft and there is no active leak. I talked to [place] from trauma. Given that patient is 11 and has multiple medical problems and the injury was a week old, will admit to medicine service but trauma to see patient as consult.   CRITICAL CARE Performed by: Wandra Arthurs   Total critical care time: 30 minutes  Critical care time was exclusive of separately billable procedures and treating other patients.  Critical care was necessary to treat or prevent imminent or life-threatening deterioration.  Critical care was time spent personally by me on the following activities: development of treatment plan with patient and/or surrogate as well as nursing, discussions with consultants, evaluation of patient's response to treatment, examination of patient, obtaining history from patient or surrogate, ordering and performing treatments and interventions, ordering and review of laboratory studies, ordering and review of radiographic studies, pulse oximetry and re-evaluation of patient's condition.     Drenda Freeze, MD 06/16/18 [phone]

## 2018-06-16 NOTE — ED Triage Notes (Signed)
Patient BIB [facility] for back pain. Patient reports falling 1 week ago on his LEFT. States he was checked out afterwards and was told everything was fine. Patient c/o increasing pain from LEFT abdomen to LEFT back. Denies chest pain, SOB, fever, weakness, or headache.   BP 186/97 HR 71 14  97%  130 CBG

## 2018-06-16 NOTE — ED Notes (Signed)
Meal tray ordered for patient. Patient given apple juice.

## 2018-06-16 NOTE — ED Provider Notes (Signed)
[facility] Emergency Department Provider Note MRN:  [id number]  Arrival date & time: 06/16/18     Chief Complaint   Back Pain   History of Present Illness   Bryan Ford is a 85 y.o. year-old male with a history of AAA, CKD, ulcerative colitis presenting to the ED with chief complaint of back pain.  Patient was out in the yard 1 week ago, slipped and fell onto his back and left flank, no head trauma, no loss of consciousness, endorsing continued flank, back, left-sided abdominal pain since that time.  Wife complains that he has been unable to stand from a seated position all week.  Had x-rays done at PCPs office which were unremarkable.  Unable to provide enough care for him at home.  Denies vomiting, no neck pain, no chest pain, no shortness of breath.  Review of Systems  A complete 10 system review of systems was obtained and all systems are negative except as noted in the HPI and PMH.   Patient's Health History    Past Medical History:  Diagnosis Date  . AAA (abdominal aortic aneurysm) ([place])   . AAA (abdominal aortic aneurysm) without rupture (HCC)    Abdominal ultrasound showed an increase from 4.1 to 5.3 cm diameter AAA.  Marland Kitchen Borderline hypertension   . Chronic kidney disease, stage 3 ([place])   . ED (erectile dysfunction)   . Esophageal reflux   . History of kidney stones   . Hyperlipidemia   . Pneumonia 08-2013  . Ulcerative colitis ([place])    Takes mesalamine    Past Surgical History:  Procedure Laterality Date  . ABDOMINAL AORTIC ENDOVASCULAR STENT GRAFT N/A 12/16/2012   Procedure: ABDOMINAL AORTIC ENDOVASCULAR STENT GRAFT-GORE;  Surgeon: Serafina Mitchell, MD;  Location: [facility];  Service: Vascular;  Laterality: N/A;  Ultrasound guided  . BACK SURGERY    . ESOPHAGOGASTRODUODENOSCOPY N/A 12/26/2013   Procedure: ESOPHAGOGASTRODUODENOSCOPY (EGD);  Surgeon: Winfield Cunas., MD;  Location: [facility];  Service: Endoscopy;  Laterality: N/A;  . FRACTURE  SURGERY    . KIDNEY STONE SURGERY  1970s   "never opened him up"  . MANDIBLE FRACTURE SURGERY  college  . MAXIMUM ACCESS (MAS)POSTERIOR LUMBAR INTERBODY FUSION (PLIF) 1 LEVEL  03/2000   L5-S1 laminectomy, diskectomy, posterior lumbar body fusion with decompressive lumbar laminectomy at L5-S1/notes 09/24/2010  . SAVORY DILATION N/A 12/26/2013   Procedure: SAVORY DILATION;  Surgeon: Winfield Cunas., MD;  Location: [facility];  Service: Endoscopy;  Laterality: N/A;  . TONSILLECTOMY      Family History  Problem Relation Age of Onset  . Cancer Mother     Social History   Socioeconomic History  . Marital status: Married    Spouse name: Or rub  . Number of children: 3  . Years of education: Not on file  . Highest education level: Not on file  Occupational History  . Occupation: Retired    Fish farm manager: [employer]    Comment:  procurement  Social Needs  . Financial resource strain: Not on file  . Food insecurity:    Worry: Not on file    Inability: Not on file  . Transportation needs:    Medical: Not on file    Non-medical: Not on file  Tobacco Use  . Smoking status: Former Smoker    Years: 20.00    Types: Cigarettes, Cigars    Last attempt to quit: 11/01/1973    Years since quitting: 44.6  .  Smokeless tobacco: Never Used  Substance and Sexual Activity  . Alcohol use: Yes    Comment: 07/02/2017 occasionally, but not every week  . Drug use: No  . Sexual activity: Not on file  Lifestyle  . Physical activity:    Days per week: Not on file    Minutes per session: Not on file  . Stress: Not on file  Relationships  . Social connections:    Talks on phone: Not on file    Gets together: Not on file    Attends religious service: Not on file    Active member of club or organization: Not on file    Attends meetings of clubs or organizations: Not on file    Relationship status: Not on file  . Intimate partner violence:    Fear of current or ex partner: Not on file    Emotionally  abused: Not on file    Physically abused: Not on file    Forced sexual activity: Not on file  Other Topics Concern  . Not on file  Social History Narrative   7 range of motion. Very is worse on the ER all day, including mowing the lawn. Also plays golf and walks regularly.     Physical Exam  Vital Signs and Nursing Notes reviewed Vitals:   06/16/18 0316 06/16/18 0632  BP: (!) 183/89 (!) 191/77  Pulse: 66 72  Resp: 20 18  Temp: 98.3 F (36.8 C)   SpO2: 100% 95%    CONSTITUTIONAL: Well-appearing, NAD NEURO:  Alert and oriented x 3, no focal deficits EYES:  eyes equal and reactive ENT/NECK:  no LAD, no JVD CARDIO: Regular rate, well-perfused, normal S1 and S2 PULM:  CTAB no wheezing or rhonchi GI/GU:  normal bowel sounds, non-distended, non-tender MSK/SPINE:  No gross deformities, 2+ pitting edema bilateral lower extremities SKIN:  no rash, atraumatic PSYCH:  Appropriate speech and behavior  Diagnostic and Interventional Summary    EKG Interpretation  Date/Time:  Thursday June 16 2018 05:09:07 EST Ventricular Rate:  72 PR Interval:  176 QRS Duration: 86 QT Interval:  386 QTC Calculation: 422 R Axis:   13 Text Interpretation:  Sinus rhythm with Possible Premature atrial complexes with Abberant conduction Anterior infarct , age undetermined Abnormal ECG Confirmed by Gerlene Fee [phone]) on 06/16/2018 7:09:41 AM      Labs Reviewed  CBC - Abnormal; Notable for the following components:      Result Value   RBC 3.17 (*)    Hemoglobin 9.1 (*)    HCT 30.3 (*)    All other components within normal limits  COMPREHENSIVE METABOLIC PANEL - Abnormal; Notable for the following components:   Potassium 3.3 (*)    Chloride 112 (*)    CO2 21 (*)    Glucose, Bld 114 (*)    Creatinine, Ser 1.65 (*)    Calcium 8.7 (*)    Total Protein 6.4 (*)    Albumin 3.0 (*)    AST 14 (*)    GFR calc non Af Amer 36 (*)    GFR calc Af Amer 41 (*)    All other components within normal  limits  BRAIN NATRIURETIC PEPTIDE - Abnormal; Notable for the following components:   B Natriuretic Peptide 143.5 (*)    All other components within normal limits  URINALYSIS, ROUTINE W REFLEX MICROSCOPIC    DG Chest 2 View  Final Result    CT ABDOMEN PELVIS W CONTRAST    (Results Pending)  Medications - No data to display   Procedures Critical Care  ED Course and Medical Decision Making  I have reviewed the triage vital signs and the nursing notes.  Pertinent labs & imaging results that were available during my care of the patient were reviewed by me and considered in my medical decision making (see below for details).  CT of the abdomen to exclude significant traumatic injuries, patient has a known abdominal aortic aneurysm.  Labs to evaluate for increased lower extremity edema recently.  If no clear indication for admission with the work-up, will likely need to consult case management as his needs at home are exceeding the care he has there.  Still awaiting CT scan, signed out to Dr. Darl Householder at shift change.  Barth Kirks. Sedonia Small, [place] [email or web address]  Final Clinical Impressions(s) / ED Diagnoses     ICD-10-CM   1. Fall W19.Daymon Larsen Chest 2 View    DG Chest 2 View    ED Discharge Orders    None         Maudie Flakes, MD 06/16/18 [phone]

## 2018-06-16 NOTE — ED Notes (Signed)
Trauma PA at bedside speaking with patient and family.

## 2018-06-16 NOTE — Progress Notes (Signed)
  Pt orientation to unit, room and routine. Information packet given to patient/family and safety video watched.  Admission INP armband ID verified with patient/family, and in place. SR up x 2, fall risk assessment complete with Patient and family verbalizing understanding of risks associated with falls. Pt verbalizes an understanding of how to use the call bell and to call for help before getting out of bed.  Skin, clean-dry, bilateral skin abrasion noted in left and right leg, from a fall at home. Pt placed on a low bed for high fall risk.

## 2018-06-16 NOTE — H&P (Signed)
History and Physical    Bryan Ford YCX:[id number] DOB: 06/28/1925 DOA: 06/16/2018  PCP: Deland Pretty, MD  Patient coming from: Home.  I have personally briefly reviewed patient's old medical records available.   Chief Complaint: Left-sided chest pain, flank pain and unable to ambulate at home.  HPI: Bryan Ford is a 85 y.o. male with medical history significant of AAA status post repair, CKD stage III, GERD, hyperlipidemia, ulcerative colitis and diet-controlled hypertension who presents to the emergency room with left-sided chest pain, flank pain after sustaining fall about a week ago.  Patient does have history of falls and also has ambulatory difficulties.  6 days ago, he was walking in the yard, he slipped and fell onto his back and left side.  He did not have any loss of consciousness, no head injury.  He did start having pain on his on the left side, he also had abrasions and cut injuries on his both shins.  He went to his primary care physician, a chest x-ray did not show any complications, he was taking Tylenol at home.  Patient has progressively worse pain.  He was trying to manage that with Tylenol at home.  Last night, he could not get out of the bed, his wife could not move him and he was in severe pain so she called life alert and was brought to the ER.  Patient himself complains of pain worse with ambulation.  No cough or cold-like symptoms.  Denies any sputum production. ED Course: Hemodynamically stable.  Creatinine is at about baseline.  Blood pressures are stable.  Potassium is 3.3.  Underwent trauma series including CT scan of the chest, CT abdomen pelvis with contrast that was consistent with fracture of 5-10 posterior ribs on the left side with no displacement with minimal pleural effusion and atelectasis.  Review of Systems: As per HPI otherwise 10 point review of systems negative.    Past Medical History:  Diagnosis Date  . AAA (abdominal aortic aneurysm) ([place])   .  AAA (abdominal aortic aneurysm) without rupture (HCC)    Abdominal ultrasound showed an increase from 4.1 to 5.3 cm diameter AAA.  Marland Kitchen Borderline hypertension   . Chronic kidney disease, stage 3 ([place])   . ED (erectile dysfunction)   . Esophageal reflux   . History of kidney stones   . Hyperlipidemia   . Pneumonia 08-2013  . Ulcerative colitis ([place])    Takes mesalamine    Past Surgical History:  Procedure Laterality Date  . ABDOMINAL AORTIC ENDOVASCULAR STENT GRAFT N/A 12/16/2012   Procedure: ABDOMINAL AORTIC ENDOVASCULAR STENT GRAFT-GORE;  Surgeon: Serafina Mitchell, MD;  Location: [facility];  Service: Vascular;  Laterality: N/A;  Ultrasound guided  . BACK SURGERY    . ESOPHAGOGASTRODUODENOSCOPY N/A 12/26/2013   Procedure: ESOPHAGOGASTRODUODENOSCOPY (EGD);  Surgeon: Winfield Cunas., MD;  Location: [facility];  Service: Endoscopy;  Laterality: N/A;  . FRACTURE SURGERY    . KIDNEY STONE SURGERY  1970s   "never opened him up"  . MANDIBLE FRACTURE SURGERY  college  . MAXIMUM ACCESS (MAS)POSTERIOR LUMBAR INTERBODY FUSION (PLIF) 1 LEVEL  03/2000   L5-S1 laminectomy, diskectomy, posterior lumbar body fusion with decompressive lumbar laminectomy at L5-S1/notes 09/24/2010  . SAVORY DILATION N/A 12/26/2013   Procedure: SAVORY DILATION;  Surgeon: Winfield Cunas., MD;  Location: [facility];  Service: Endoscopy;  Laterality: N/A;  . TONSILLECTOMY       reports that he quit smoking about 44 years ago.  His smoking use included cigarettes and cigars. He quit after 20.00 years of use. He has never used smokeless tobacco. He reports current alcohol use. He reports that he does not use drugs.  No Known Allergies  Family History  Problem Relation Age of Onset  . Cancer Mother      Prior to Admission medications   Medication Sig Start Date End Date Taking? Authorizing Provider  acetaminophen (TYLENOL) 325 MG tablet Take 650 mg by mouth every 6 (six) hours as needed for mild pain.   Yes [provider]  Ascorbic Acid (VITAMIN C) 500 MG CHEW Chew 1 tablet by mouth daily.   Yes [provider]  Cholecalciferol (VITAMIN D3) 1000 units CHEW Chew 1 tablet by mouth daily.   Yes [provider]  dextromethorphan-guaiFENesin (MUCINEX DM) 30-600 MG 12hr tablet Take 1 tablet by mouth 2 (two) times daily as needed for cough.   Yes [provider]  ferrous sulfate 325 (65 FE) MG tablet Take 1 tablet (325 mg total) by mouth daily. 09/09/17  Yes Gareth Morgan, MD  mesalamine (LIALDA) 1.2 G EC tablet Take 2.4 g by mouth daily with breakfast.    Yes [provider]    Physical Exam: Vitals:   06/16/18 0316 06/16/18 0632 06/16/18 0803 06/16/18 0902  BP: (!) 183/89 (!) 191/77 (!) 178/85 (!) 179/80  Pulse: 66 72 68 68  Resp: 20 18 18 16   Temp: 98.3 F (36.8 C)   98.6 F (37 C)  TempSrc: Oral   Oral  SpO2: 100% 95% 96% 96%  Weight:      Height:        Constitutional: NAD, calm, comfortable Vitals:   06/16/18 0316 06/16/18 0632 06/16/18 0803 06/16/18 0902  BP: (!) 183/89 (!) 191/77 (!) 178/85 (!) 179/80  Pulse: 66 72 68 68  Resp: 20 18 18 16   Temp: 98.3 F (36.8 C)   98.6 F (37 C)  TempSrc: Oral   Oral  SpO2: 100% 95% 96% 96%  Weight:      Height:       Eyes: PERRL, lids and conjunctivae normal Patient is in mild pain just received fentanyl injection. ENMT: Mucous membranes are moist. Posterior pharynx clear of any exudate or lesions.Normal dentition.  Neck: normal, supple, no masses, no thyromegaly Respiratory: clear to auscultation bilaterally, no wheezing, no crackles. Normal respiratory effort. No accessory muscle use.  Moderate pain on deep breathing. Cardiovascular: Regular rate and rhythm, no murmurs / rubs / gallops.  1+ pedal edema right more than left mostly on the legs.  2+ pedal pulses. No carotid bruits.  Abdomen: no tenderness, no masses palpated. No hepatosplenomegaly. Bowel sounds positive.  Musculoskeletal: no clubbing /  cyanosis. No joint deformity upper and lower extremities. Good ROM, no contractures. Normal muscle tone.  Skin: no rashes, lesions, ulcers. No induration Neurologic: CN 2-12 grossly intact. Sensation intact, DTR normal. Strength 5/5 in all 4.  Psychiatric: Normal judgment and insight. Alert and oriented x 3. Normal mood.   Clean and healing abrasions on both legs anterior aspect.  Labs on Admission: I have personally reviewed following labs and imaging studies  CBC: Recent Labs  Lab 06/16/18 0502  WBC 9.2  HGB 9.1*  HCT 30.3*  MCV 95.6  PLT 767   Basic Metabolic Panel: Recent Labs  Lab 06/16/18 0502  NA 144  K 3.3*  CL 112*  CO2 21*  GLUCOSE 114*  BUN 22  CREATININE 1.65*  CALCIUM 8.7*  GFR: Estimated Creatinine Clearance: 28.2 mL/min (A) (by C-G formula based on SCr of 1.65 mg/dL (H)). Liver Function Tests: Recent Labs  Lab 06/16/18 0502  AST 14*  ALT 12  ALKPHOS 42  BILITOT 0.6  PROT 6.4*  ALBUMIN 3.0*   No results for input(s): LIPASE, AMYLASE in the last 168 hours. No results for input(s): AMMONIA in the last 168 hours. Coagulation Profile: No results for input(s): INR, PROTIME in the last 168 hours. Cardiac Enzymes: No results for input(s): CKTOTAL, CKMB, CKMBINDEX, TROPONINI in the last 168 hours. BNP (last 3 results) No results for input(s): PROBNP in the last 8760 hours. HbA1C: No results for input(s): HGBA1C in the last 72 hours. CBG: No results for input(s): GLUCAP in the last 168 hours. Lipid Profile: No results for input(s): CHOL, HDL, LDLCALC, TRIG, CHOLHDL, LDLDIRECT in the last 72 hours. Thyroid Function Tests: No results for input(s): TSH, T4TOTAL, FREET4, T3FREE, THYROIDAB in the last 72 hours. Anemia Panel: No results for input(s): VITAMINB12, FOLATE, FERRITIN, TIBC, IRON, RETICCTPCT in the last 72 hours. Urine analysis:    Component Value Date/Time   COLORURINE YELLOW 06/16/2018 0424   APPEARANCEUR CLEAR 06/16/2018 0424   LABSPEC  1.016 06/16/2018 0424   PHURINE 5.0 06/16/2018 0424   GLUCOSEU NEGATIVE 06/16/2018 0424   HGBUR NEGATIVE 06/16/2018 0424   BILIRUBINUR NEGATIVE 06/16/2018 0424   KETONESUR NEGATIVE 06/16/2018 0424   PROTEINUR NEGATIVE 06/16/2018 0424   UROBILINOGEN 0.2 11/05/2013 2253   NITRITE NEGATIVE 06/16/2018 0424   LEUKOCYTESUR NEGATIVE 06/16/2018 0424    Radiological Exams on Admission: Dg Chest 2 View  Result Date: 06/16/2018 CLINICAL DATA:  Fall 1 week ago EXAM: CHEST - 2 VIEW COMPARISON:  Three days ago FINDINGS: Borderline heart size accentuated by low volumes. Lower mediastinal widening from hiatal hernia. Small left pleural effusion. No consolidation or Kerley lines. No pneumothorax. No visible rib fracture. IMPRESSION: 1. Small left pleural effusion. 2. No visible fracture and no pneumothorax. Electronically Signed   By: Monte Fantasia M.D.   On: 06/16/2018 05:14   Ct Chest Wo Contrast  Result Date: 06/16/2018 CLINICAL DATA:  Upper back pain.  Fall 1 week ago EXAM: CT CHEST WITHOUT CONTRAST TECHNIQUE: Multidetector CT imaging of the chest was performed following the standard protocol without IV contrast. COMPARISON:  07/05/2017 FINDINGS: Cardiovascular: Normal heart size. No pericardial effusion. Multifocal coronary atherosclerosis. Mild aortic calcification. Mediastinum/Nodes: Negative for hematoma or pneumomediastinum. Moderate hiatal hernia. Nodular goiter with asymmetric enlargement of the left lobe extending into the upper mediastinum, stable Lungs/Pleura: Small left pleural effusion with bilateral lower lobe atelectasis. The pleural fluid measures water density. No edema or pneumothorax. Upper Abdomen: As reported on dedicated preceding abdominal CT with contrast Musculoskeletal: Nondisplaced left fifth through tenth rib fractures. Negative for spinal fracture or malalignment. Healed remote right ninth rib fracture. IMPRESSION: 1. Nondisplaced left fifth through tenth rib fractures. 2. Small  left pleural effusion and lower lobe atelectasis on both sides. Electronically Signed   By: Monte Fantasia M.D.   On: 06/16/2018 08:33   Ct Abdomen Pelvis W Contrast  Result Date: 06/16/2018 CLINICAL DATA:  Fall 1 week ago with continued abdominal pain EXAM: CT ABDOMEN AND PELVIS WITH CONTRAST TECHNIQUE: Multidetector CT imaging of the abdomen and pelvis was performed using the standard protocol following bolus administration of intravenous contrast. CONTRAST:  30m OMNIPAQUE IOHEXOL 300 MG/ML  SOLN COMPARISON:  07/02/2017 FINDINGS: Lower chest: Borderline heart size. Moderate hiatal hernia. Small left pleural effusion with atelectasis. Hepatobiliary: No  focal liver abnormality.Cholelithiasis without signs of cholecystitis Pancreas: Unremarkable. Spleen: Unremarkable. Adrenals/Urinary Tract: Negative adrenals. No hydronephrosis or stone. Small bilateral renal cystic densities. Negative bladder. Stomach/Bowel: Circumferential low-density thickening of the rectum and sigmoid colon with mild stranding of the adjacent fat. Lymph nodes in the associated mesentery are also prominent. Negative for bowel obstruction. Vascular/Lymphatic: Aorto bi-iliac grafting. There is progressive enhancement within the excluded aneurysm sac consistent with endoleak. Sac dimensions are similar to 2019 comparison at up to 51 mm. Reproductive:Symmetric enlargement of the prostate gland Other: No ascites or pneumoperitoneum. Musculoskeletal: Lateral left seventh through tenth rib fractures, nondisplaced. Advanced spinal degeneration with lower lumbar canal and foraminal impingement IMPRESSION: 1. Lateral left seventh through tenth rib fractures, nondisplaced. There is associated atelectasis and small effusion at the left base. 2. Sigmoid colitis and proctitis with hypertrophic fat correlating with history of ulcerative colitis. 3. No traumatic finding within the abdomen. 4. Treated infrarenal aortic aneurysm with endoleak. The aneurysm  sac size is stable from February 2019. Electronically Signed   By: Monte Fantasia M.D.   On: 06/16/2018 08:19    EKG: Independently reviewed.  Normal sinus rhythm with PACs.  Comparable to previous EKG.  Assessment/Plan Principal Problem:   Closed fracture of multiple ribs Active Problems:   AAA (abdominal aortic aneurysm) (HCC)   Hypertension   PAD (peripheral artery disease) (HCC)   Dyslipidemia, goal LDL below 70   CKD (chronic kidney disease), stage III (HCC)   GERD (gastroesophageal reflux disease)   Physical debility   Multiple rib fractures   1.  Close fracture of multiple ribs: Uncomplicated multiple rib fractures.  Ongoing pain issue and debility.  Agree with monitoring in the hospital for pain control.  Will start patient on pain regimen with oral opiates and Tylenol.  Incentive spirometry, deep breathing exercises and mobility.  Consult PT OT.  He will benefit with inpatient physical therapy given profound debility and fall.  Consult Education officer, museum after PT OT evaluation.  Seen by trauma surgery, not anticipating any procedure.  2.  Hypertension: Diet controlled at home.  Will monitor.  Will treat only when it is more than 646O systolic.  3.  CKD stage III: Baseline creatinine about 1.6.  Patient got contrasted studies.  Will recheck in the morning to ensure stabilization.  4.  Ulcerative colitis: Symptoms controlled.  On mesalamine.  Will continue.  5.  GERD: On PPI.  Will continue.  6.  AAA status post repair: CT angiogram showed no evidence of complications.  Advance care planning: Discussed in detail about advanced care planning.  Patient has clear wishes of DNR and DNI.  Also confirmed by patient's wife at the bedside.   DVT prophylaxis: Lovenox subcu. Code Status: DNR/DNI. Family Communication: Wife at the bedside. Disposition Plan: Skilled nursing facility. Consults called: Trauma. Admission status: Observation MedSurg bed.   Barb Merino MD [facility] Pager [phone]  If 7PM-7AM, please contact night-coverage [email or web address] Password [email or web address]  06/16/2018, 9:26 AM

## 2018-06-16 NOTE — ED Notes (Signed)
Patient transported to CT 

## 2018-06-16 NOTE — ED Notes (Signed)
ED TO INPATIENT HANDOFF REPORT  Name/Age/Gender Bryan Ford 85 y.o. male  Code Status Code Status History    Date Active Date Inactive Code Status Order ID Comments User Context   07/02/2017 0749 07/08/2017 1928 Full Code [id number]  Elease Hashimoto ED   12/16/2012 1231 12/17/2012 1745 Full Code [id number]  Ulyses Amor, PA-C Inpatient      Home/SNF/Other Home  Chief Complaint fall  Level of Care/Admitting Diagnosis ED Disposition    ED Disposition Condition Comment   Admit  Hospital Area: [facility] [[id number]]  Level of Care: Med-Surg [16]  I expect the patient will be discharged within 24 hours: No (not a candidate for 5C-Observation unit)  Diagnosis: Multiple rib fractures [676195]  Admitting Physician: Barb Merino [[id number]]  Attending Physician: Barb Merino [[id number]]  PT Class (Do Not Modify): Observation [104]  PT Acc Code (Do Not Modify): Observation [10022]       Medical History Past Medical History:  Diagnosis Date  . AAA (abdominal aortic aneurysm) ([place])   . AAA (abdominal aortic aneurysm) without rupture (HCC)    Abdominal ultrasound showed an increase from 4.1 to 5.3 cm diameter AAA.  Marland Kitchen Borderline hypertension   . Chronic kidney disease, stage 3 ([place])   . ED (erectile dysfunction)   . Esophageal reflux   . History of kidney stones   . Hyperlipidemia   . Pneumonia 08-2013  . Ulcerative colitis ([place])    Takes mesalamine    Allergies No Known Allergies  IV Location/Drains/Wounds Patient Lines/Drains/Airways Status   Active Line/Drains/Airways    Name:   Placement date:   Placement time:   Site:   Days:   Peripheral IV 06/16/18 Right Wrist   06/16/18    0656    Wrist   less than 1   Incision 12/16/12 Groin Bilateral   12/16/12    0755     2008          Labs/Imaging Results for orders placed or performed during the hospital encounter of 06/16/18 (from the past 48 hour(s))  Urinalysis, Routine w reflex microscopic      Status: None   Collection Time: 06/16/18  4:24 AM  Result Value Ref Range   Color, Urine YELLOW YELLOW   APPearance CLEAR CLEAR   Specific Gravity, Urine 1.016 1.005 - 1.030   pH 5.0 5.0 - 8.0   Glucose, UA NEGATIVE NEGATIVE mg/dL   Hgb urine dipstick NEGATIVE NEGATIVE   Bilirubin Urine NEGATIVE NEGATIVE   Ketones, ur NEGATIVE NEGATIVE mg/dL   Protein, ur NEGATIVE NEGATIVE mg/dL   Nitrite NEGATIVE NEGATIVE   Leukocytes, UA NEGATIVE NEGATIVE    Comment: Performed at [facility], [address], [place], [place] [address]  CBC     Status: Abnormal   Collection Time: 06/16/18  5:02 AM  Result Value Ref Range   WBC 9.2 4.0 - 10.5 K/uL   RBC 3.17 (L) 4.22 - 5.81 MIL/uL   Hemoglobin 9.1 (L) 13.0 - 17.0 g/dL   HCT 30.3 (L) 39.0 - 52.0 %   MCV 95.6 80.0 - 100.0 fL   MCH 28.7 26.0 - 34.0 pg   MCHC 30.0 30.0 - 36.0 g/dL   RDW 14.2 11.5 - 15.5 %   Platelets 293 150 - 400 K/uL   nRBC 0.0 0.0 - 0.2 %    Comment: Performed at [facility], [place] [address], [place], [place] [address]  Comprehensive metabolic panel  Status: Abnormal   Collection Time: 06/16/18  5:02 AM  Result Value Ref Range   Sodium 144 135 - 145 mmol/L   Potassium 3.3 (L) 3.5 - 5.1 mmol/L   Chloride 112 (H) 98 - 111 mmol/L   CO2 21 (L) 22 - 32 mmol/L   Glucose, Bld 114 (H) 70 - 99 mg/dL   BUN 22 8 - 23 mg/dL   Creatinine, Ser 1.65 (H) 0.61 - 1.24 mg/dL   Calcium 8.7 (L) 8.9 - 10.3 mg/dL   Total Protein 6.4 (L) 6.5 - 8.1 g/dL   Albumin 3.0 (L) 3.5 - 5.0 g/dL   AST 14 (L) 15 - 41 U/L   ALT 12 0 - 44 U/L   Alkaline Phosphatase 42 38 - 126 U/L   Total Bilirubin 0.6 0.3 - 1.2 mg/dL   GFR calc non Af Amer 36 (L) >60 mL/min   GFR calc Af Amer 41 (L) >60 mL/min   Anion gap 11 5 - 15    Comment: Performed at [facility], [place] [address], [place], [place] [address]  Brain natriuretic peptide     Status: Abnormal   Collection Time: 06/16/18  5:02 AM  Result Value Ref Range   B Natriuretic Peptide  143.5 (H) 0.0 - 100.0 pg/mL    Comment: Performed at [facility] [address], [place], [place] [address]   Dg Chest 2 View  Result Date: 06/16/2018 CLINICAL DATA:  Fall 1 week ago EXAM: CHEST - 2 VIEW COMPARISON:  Three days ago FINDINGS: Borderline heart size accentuated by low volumes. Lower mediastinal widening from hiatal hernia. Small left pleural effusion. No consolidation or Kerley lines. No pneumothorax. No visible rib fracture. IMPRESSION: 1. Small left pleural effusion. 2. No visible fracture and no pneumothorax. Electronically Signed   By: Monte Fantasia M.D.   On: 06/16/2018 05:14   Ct Chest Wo Contrast  Result Date: 06/16/2018 CLINICAL DATA:  Upper back pain.  Fall 1 week ago EXAM: CT CHEST WITHOUT CONTRAST TECHNIQUE: Multidetector CT imaging of the chest was performed following the standard protocol without IV contrast. COMPARISON:  07/05/2017 FINDINGS: Cardiovascular: Normal heart size. No pericardial effusion. Multifocal coronary atherosclerosis. Mild aortic calcification. Mediastinum/Nodes: Negative for hematoma or pneumomediastinum. Moderate hiatal hernia. Nodular goiter with asymmetric enlargement of the left lobe extending into the upper mediastinum, stable Lungs/Pleura: Small left pleural effusion with bilateral lower lobe atelectasis. The pleural fluid measures water density. No edema or pneumothorax. Upper Abdomen: As reported on dedicated preceding abdominal CT with contrast Musculoskeletal: Nondisplaced left fifth through tenth rib fractures. Negative for spinal fracture or malalignment. Healed remote right ninth rib fracture. IMPRESSION: 1. Nondisplaced left fifth through tenth rib fractures. 2. Small left pleural effusion and lower lobe atelectasis on both sides. Electronically Signed   By: Monte Fantasia M.D.   On: 06/16/2018 08:33   Ct Abdomen Pelvis W Contrast  Result Date: 06/16/2018 CLINICAL DATA:  Fall 1 week ago with continued abdominal pain EXAM: CT ABDOMEN  AND PELVIS WITH CONTRAST TECHNIQUE: Multidetector CT imaging of the abdomen and pelvis was performed using the standard protocol following bolus administration of intravenous contrast. CONTRAST:  55m OMNIPAQUE IOHEXOL 300 MG/ML  SOLN COMPARISON:  07/02/2017 FINDINGS: Lower chest: Borderline heart size. Moderate hiatal hernia. Small left pleural effusion with atelectasis. Hepatobiliary: No focal liver abnormality.Cholelithiasis without signs of cholecystitis Pancreas: Unremarkable. Spleen: Unremarkable. Adrenals/Urinary Tract: Negative adrenals. No hydronephrosis or stone. Small bilateral renal cystic densities. Negative bladder. Stomach/Bowel: Circumferential low-density  thickening of the rectum and sigmoid colon with mild stranding of the adjacent fat. Lymph nodes in the associated mesentery are also prominent. Negative for bowel obstruction. Vascular/Lymphatic: Aorto bi-iliac grafting. There is progressive enhancement within the excluded aneurysm sac consistent with endoleak. Sac dimensions are similar to 2019 comparison at up to 51 mm. Reproductive:Symmetric enlargement of the prostate gland Other: No ascites or pneumoperitoneum. Musculoskeletal: Lateral left seventh through tenth rib fractures, nondisplaced. Advanced spinal degeneration with lower lumbar canal and foraminal impingement IMPRESSION: 1. Lateral left seventh through tenth rib fractures, nondisplaced. There is associated atelectasis and small effusion at the left base. 2. Sigmoid colitis and proctitis with hypertrophic fat correlating with history of ulcerative colitis. 3. No traumatic finding within the abdomen. 4. Treated infrarenal aortic aneurysm with endoleak. The aneurysm sac size is stable from February 2019. Electronically Signed   By: Monte Fantasia M.D.   On: 06/16/2018 08:19   EKG Interpretation  Date/Time:  Thursday June 16 2018 05:09:07 EST Ventricular Rate:  72 PR Interval:  176 QRS Duration: 86 QT Interval:  386 QTC  Calculation: 422 R Axis:   13 Text Interpretation:  Sinus rhythm with Possible Premature atrial complexes with Abberant conduction Anterior infarct , age undetermined Abnormal ECG Confirmed by Gerlene Fee [phone]) on 06/16/2018 7:09:41 AM   Pending Labs Unresulted Labs (From admission, onward)    Start     Ordered   Signed and Held  Basic metabolic panel  Tomorrow morning,   R     Signed and Held          Vitals/Pain Today's Vitals   06/16/18 0632 06/16/18 0803 06/16/18 0902 06/16/18 0932  BP: (!) 191/77 (!) 178/85 (!) 179/80   Pulse: 72 68 68   Resp: 18 18 16    Temp:   98.6 F (37 C)   TempSrc:   Oral   SpO2: 95% 96% 96%   Weight:      Height:      PainSc:    8     Isolation Precautions No active isolations  Medications Medications  potassium chloride SA (K-DUR,KLOR-CON) CR tablet 40 mEq (has no administration in time range)  iohexol (OMNIPAQUE) 300 MG/ML solution 80 mL (80 mLs Intravenous Contrast Given 06/16/18 0733)  hydrALAZINE (APRESOLINE) injection 10 mg (10 mg Intravenous Given 06/16/18 0805)  fentaNYL (SUBLIMAZE) injection 50 mcg (50 mcg Intravenous Given 06/16/18 0905)    Mobility walks with device since fall

## 2018-06-16 NOTE — Consult Note (Signed)
Bryan Ford 1926/03/01  [id number].    Requesting MD: Dr. Shirlyn Goltz, EDP Chief Complaint/Reason for Consult: left rib FX 5-10  HPI:  This is a 85 yo white male with a history of UC followed by Dr. Oletta Lamas, AAA with endoleak stenting, CKD stage 3, HTN, who has had now 3 falls in the last year resulting in different fractures.  His wife has also noted some memory impairment as well recently.  He fractured his right ankle in February of 2019 resulting in a one week hospital stay and 1 month stent in [place].  Shortly after being home he fell again and fractured his other ankle requiring casting.  He has once again fallen about a week ago while he was outside walking in the yard.  The wife states he uses a walker some of the time, but other times he can walk in the house on his own, but very short distances.  After he fell last Wednesday, he has been complaining of pain in his left chest.  He saw his PCP last Friday who apparently took x-rays of his pelvis, but not his chest, by report.  These were negative. He has continued to have pain since then and is able to sit still with minimal pain, but unable to get up and use his arms and muscles much for mobilization. He denies SOB and just admits to left-sided chest pain.  Nothing else hurts or is bothering him, although his wife states he has 2 abrasions to both of his shins from the fall.  He has been eating well and moving his bowels well, just a little looser than normal.  He was brought in today for further evaluation due to persistent pain.  He had a CT of his chest which revealed left ribs 5-10 FXs and maybe a trace effusion.  He is being admitted by the medicine service, but we have been asked to see him for evaluation as well.   ROS: ROS: Please see HPI, otherwise all other systems are currently negative.  Family History  Problem Relation Age of Onset  . Cancer Mother     Past Medical History:  Diagnosis Date  . AAA (abdominal aortic  aneurysm) ([place])   . AAA (abdominal aortic aneurysm) without rupture (HCC)    Abdominal ultrasound showed an increase from 4.1 to 5.3 cm diameter AAA.  Marland Kitchen Borderline hypertension   . Chronic kidney disease, stage 3 ([place])   . ED (erectile dysfunction)   . Esophageal reflux   . History of kidney stones   . Hyperlipidemia   . Pneumonia 08-2013  . Ulcerative colitis ([place])    Takes mesalamine    Past Surgical History:  Procedure Laterality Date  . ABDOMINAL AORTIC ENDOVASCULAR STENT GRAFT N/A 12/16/2012   Procedure: ABDOMINAL AORTIC ENDOVASCULAR STENT GRAFT-GORE;  Surgeon: Serafina Mitchell, MD;  Location: [facility];  Service: Vascular;  Laterality: N/A;  Ultrasound guided  . BACK SURGERY    . ESOPHAGOGASTRODUODENOSCOPY N/A 12/26/2013   Procedure: ESOPHAGOGASTRODUODENOSCOPY (EGD);  Surgeon: Winfield Cunas., MD;  Location: [facility];  Service: Endoscopy;  Laterality: N/A;  . FRACTURE SURGERY    . KIDNEY STONE SURGERY  1970s   "never opened him up"  . MANDIBLE FRACTURE SURGERY  college  . MAXIMUM ACCESS (MAS)POSTERIOR LUMBAR INTERBODY FUSION (PLIF) 1 LEVEL  03/2000   L5-S1 laminectomy, diskectomy, posterior lumbar body fusion with decompressive lumbar laminectomy at L5-S1/notes 09/24/2010  . SAVORY DILATION N/A 12/26/2013  Procedure: SAVORY DILATION;  Surgeon: Winfield Cunas., MD;  Location: [facility];  Service: Endoscopy;  Laterality: N/A;  . TONSILLECTOMY      Social History:  reports that he quit smoking about 44 years ago. His smoking use included cigarettes and cigars. He quit after 20.00 years of use. He has never used smokeless tobacco. He reports current alcohol use. He reports that he does not use drugs.  Allergies: No Known Allergies  (Not in a hospital admission)    Physical Exam: Blood pressure (!) 179/80, pulse 68, temperature 98.6 F (37 C), temperature source Oral, resp. rate 16, height 5' 8"  (1.727 m), weight 77.1 kg, SpO2 96 %. General: pleasant, WD, WN white  male who is laying in bed in NAD, but mildly confused at times. HEENT: head is normocephalic, atraumatic.  Sclera are noninjected.  Pupils are slightly unequal.  Left side is 6m and right is about 168m  Both briskly reactive to light.  Ears and nose without any masses or lesions.  Mouth is pink and dry.  Normal dentition. Heart: regular, rate, and rhythm.  Normal s1,s2. No obvious murmurs, gallops, or rubs noted.  Palpable radial and pedal pulses bilaterally Lungs: CTAB, no wheezes, rhonchi, or rales noted.  Respiratory effort nonlabored, but can't take in a deep breath secondary to pain.  Left-sided lateral and posterior chest pain to palpation.  No real pain to palpation anteriorly.  No ecchymosis noted. Abd: soft, NT, ND, +BS, no masses, hernias, or organomegaly MS: all 4 extremities are symmetrical with no cyanosis, clubbing.  Bilateral LE edema along with healing skin tears/abrasions to both shins.  Minimal erythema around right shin abrasion, does not appear infected.  Normal ROM of all extremities Skin: warm and dry with no masses, lesions, or rashes Psych: Alert, but somewhat confused as his wife provides his history as he is unable to recall all events.  Wife also states that this has worsened over the last year.   Results for orders placed or performed during the hospital encounter of 06/16/18 (from the past 48 hour(s))  Urinalysis, Routine w reflex microscopic     Status: None   Collection Time: 06/16/18  4:24 AM  Result Value Ref Range   Color, Urine YELLOW YELLOW   APPearance CLEAR CLEAR   Specific Gravity, Urine 1.016 1.005 - 1.030   pH 5.0 5.0 - 8.0   Glucose, UA NEGATIVE NEGATIVE mg/dL   Hgb urine dipstick NEGATIVE NEGATIVE   Bilirubin Urine NEGATIVE NEGATIVE   Ketones, ur NEGATIVE NEGATIVE mg/dL   Protein, ur NEGATIVE NEGATIVE mg/dL   Nitrite NEGATIVE NEGATIVE   Leukocytes, UA NEGATIVE NEGATIVE    Comment: [facility]formed [facility] [address] [place] [id number]     Status: Abnormal   Collection Time: 06/16/18  5:02 AM  Result Value Ref Range   WBC 9.2 4.0 - 10.5 K/uL   RBC 3.17 (L) 4.22 - 5.81 MIL/uL   Hemoglobin 9.1 (L) 13.0 - 17.0 g/dL   HCT 30.3 (L) 39.0 - 52.0 %   MCV 95.6 80.0 - 100.0 fL   MCH 28.7 26.0 - 34.0 pg   MCHC 30.0 30.0 - 36.0 g/dL   RDW 14.2 11.5 - 15.5 %   Platelets 293 150 - 400 K/uL   nRBC 0.0 0.0 - 0.2 %    Comment: Performed at [facility] [place] [id number] metabolic panel  Status: Abnormal   Collection Time: 06/16/18  5:02 AM  Result Value Ref Range   Sodium 144 135 - 145 mmol/L   Potassium 3.3 (L) 3.5 - 5.1 mmol/L   Chloride 112 (H) 98 - 111 mmol/L   CO2 21 (L) 22 - 32 mmol/L   Glucose, Bld 114 (H) 70 - 99 mg/dL   BUN 22 8 - 23 mg/dL   Creatinine, Ser 1.65 (H) 0.61 - 1.24 mg/dL   Calcium 8.7 (L) 8.9 - 10.3 mg/dL   Total Protein 6.4 (L) 6.5 - 8.1 g/dL   Albumin 3.0 (L) 3.5 - 5.0 g/dL   AST 14 (L) 15 - 41 U/L   ALT 12 0 - 44 U/L   Alkaline Phosphatase 42 38 - 126 U/L   Total Bilirubin 0.6 0.3 - 1.2 mg/dL   GFR calc non Af Amer 36 (L) >60 mL/min   GFR calc Af Amer 41 (L) >60 mL/min   Anion gap 11 5 - 15    Comment: Performed at [facility], [place] [address], [place], [place] [address]  Brain natriuretic peptide     Status: Abnormal   Collection Time: 06/16/18  5:02 AM  Result Value Ref Range   B Natriuretic Peptide 143.5 (H) 0.0 - 100.0 pg/mL    Comment: Performed at [facility] [address] [place], [place] [address]   Dg Chest 2 View  Result Date: 06/16/2018 CLINICAL DATA:  Fall 1 week ago EXAM: CHEST - 2 VIEW COMPARISON:  Three days ago FINDINGS: Borderline heart size accentuated by low volumes. Lower mediastinal widening from hiatal hernia. Small left pleural effusion. No consolidation or Kerley lines. No pneumothorax. No visible rib fracture. IMPRESSION: 1. Small left pleural effusion. 2. No visible fracture and no  pneumothorax. Electronically Signed   By: Monte Fantasia M.D.   On: 06/16/2018 05:14   Ct Chest Wo Contrast  Result Date: 06/16/2018 CLINICAL DATA:  Upper back pain.  Fall 1 week ago EXAM: CT CHEST WITHOUT CONTRAST TECHNIQUE: Multidetector CT imaging of the chest was performed following the standard protocol without IV contrast. COMPARISON:  07/05/2017 FINDINGS: Cardiovascular: Normal heart size. No pericardial effusion. Multifocal coronary atherosclerosis. Mild aortic calcification. Mediastinum/Nodes: Negative for hematoma or pneumomediastinum. Moderate hiatal hernia. Nodular goiter with asymmetric enlargement of the left lobe extending into the upper mediastinum, stable Lungs/Pleura: Small left pleural effusion with bilateral lower lobe atelectasis. The pleural fluid measures water density. No edema or pneumothorax. Upper Abdomen: As reported on dedicated preceding abdominal CT with contrast Musculoskeletal: Nondisplaced left fifth through tenth rib fractures. Negative for spinal fracture or malalignment. Healed remote right ninth rib fracture. IMPRESSION: 1. Nondisplaced left fifth through tenth rib fractures. 2. Small left pleural effusion and lower lobe atelectasis on both sides. Electronically Signed   By: Monte Fantasia M.D.   On: 06/16/2018 08:33   Ct Abdomen Pelvis W Contrast  Result Date: 06/16/2018 CLINICAL DATA:  Fall 1 week ago with continued abdominal pain EXAM: CT ABDOMEN AND PELVIS WITH CONTRAST TECHNIQUE: Multidetector CT imaging of the abdomen and pelvis was performed using the standard protocol following bolus administration of intravenous contrast. CONTRAST:  28m OMNIPAQUE IOHEXOL 300 MG/ML  SOLN COMPARISON:  07/02/2017 FINDINGS: Lower chest: Borderline heart size. Moderate hiatal hernia. Small left pleural effusion with atelectasis. Hepatobiliary: No focal liver abnormality.Cholelithiasis without signs of cholecystitis Pancreas: Unremarkable. Spleen: Unremarkable. Adrenals/Urinary  Tract: Negative adrenals. No hydronephrosis or stone. Small bilateral renal cystic densities. Negative bladder. Stomach/Bowel: Circumferential low-density  thickening of the rectum and sigmoid colon with mild stranding of the adjacent fat. Lymph nodes in the associated mesentery are also prominent. Negative for bowel obstruction. Vascular/Lymphatic: Aorto bi-iliac grafting. There is progressive enhancement within the excluded aneurysm sac consistent with endoleak. Sac dimensions are similar to 2019 comparison at up to 51 mm. Reproductive:Symmetric enlargement of the prostate gland Other: No ascites or pneumoperitoneum. Musculoskeletal: Lateral left seventh through tenth rib fractures, nondisplaced. Advanced spinal degeneration with lower lumbar canal and foraminal impingement IMPRESSION: 1. Lateral left seventh through tenth rib fractures, nondisplaced. There is associated atelectasis and small effusion at the left base. 2. Sigmoid colitis and proctitis with hypertrophic fat correlating with history of ulcerative colitis. 3. No traumatic finding within the abdomen. 4. Treated infrarenal aortic aneurysm with endoleak. The aneurysm sac size is stable from February 2019. Electronically Signed   By: Monte Fantasia M.D.   On: 06/16/2018 08:19      Assessment/Plan Fall, increasing frequency Left-sided rib FXs 5-10 - pain control, IS, PT/OT evaluation Bilateral lower extremity abrasions and edema - these do not appear infected.  Could have wound care evaluate these for their recommendations.  Defer to medicine regarding LE edema CKD, stage 3 - per medicine UC - per medicine Memory deficit - per wife this has been worsening in the last year HTN - per medicine AAA with stented endoleak - stable for CT Anemia - hgb 9.1.  Appears to be stable from hgb when here last February FEN - per medicine, ok for diet from our standpoint VTE  - appears to have chronic anemia, but ok for chemical prophylaxis from our  standpoint ID - none needed currently  Henreitta Cea, [facility] 06/16/2018, 10:55 AM Pager: [phone]

## 2018-06-17 DIAGNOSIS — E785 Hyperlipidemia, unspecified: Secondary | ICD-10-CM | POA: Diagnosis not present

## 2018-06-17 DIAGNOSIS — I714 Abdominal aortic aneurysm, without rupture: Secondary | ICD-10-CM | POA: Diagnosis not present

## 2018-06-17 DIAGNOSIS — S2242XA Multiple fractures of ribs, left side, initial encounter for closed fracture: Secondary | ICD-10-CM | POA: Diagnosis not present

## 2018-06-17 DIAGNOSIS — W19XXXA Unspecified fall, initial encounter: Secondary | ICD-10-CM

## 2018-06-17 LAB — BASIC METABOLIC PANEL
ANION GAP: 10 (ref 5–15)
BUN: 18 mg/dL (ref 8–23)
CHLORIDE: 110 mmol/L (ref 98–111)
CO2: 22 mmol/L (ref 22–32)
Calcium: 8.6 mg/dL — ABNORMAL LOW (ref 8.9–10.3)
Creatinine, Ser: 1.66 mg/dL — ABNORMAL HIGH (ref 0.61–1.24)
GFR calc Af Amer: 41 mL/min — ABNORMAL LOW (ref >60)
GFR calc non Af Amer: 35 mL/min — ABNORMAL LOW (ref >60)
Glucose, Bld: 99 mg/dL (ref 70–99)
Potassium: 3.8 mmol/L (ref 3.5–5.1)
Sodium: 142 mmol/L (ref 135–145)

## 2018-06-17 MED ORDER — TRAMADOL HCL 50 MG PO TABS: 50.0000 mg | ORAL_TABLET | Freq: Two times a day (BID) | ORAL | 0 refills | Status: AC | PRN

## 2018-06-17 NOTE — Care Management Note (Signed)
Case Management Note  Patient Details  Name: Bryan Ford MRN: [id number] Date of Birth: 02-08-1926  Subjective/Objective: Pt presented from home with wife with multiple rib fractures. Pt plans to return home with wife and daughter.                    Action/Plan: Medicare. gov list provided to patient. Family chose [facility]. Referral made to [facility] and [facility] to begin within 24-48 hours post transition home. DME has been delivered to room. No further needs from CM @ this time.   Expected Discharge Date:  06/17/18               Expected Discharge Plan:  [place]  In-House Referral:  NA  Discharge planning Services  CM Consult  Post Acute Care Choice:  Durable Medical Equipment, Home Health Choice offered to:  Patient, Spouse  DME Arranged:  3-N-1, Walker rolling DME Agency:  [facility] Arranged:  RN, Disease Management, PT [place] Agency:  [place]  Status of Service:  Completed, signed off  If discussed at [place] of Stay Meetings, dates discussed:    Additional Comments:  Bethena Roys, RN 06/17/2018, 1:07 PM

## 2018-06-17 NOTE — Discharge Summary (Signed)
Bryan Ford DDU:[id number] DOB: 11/11/1925 DOA: 06/16/2018  PCP: Deland Pretty, MD  Admit date: 06/16/2018  Discharge date: 06/17/2018  Admitted From: Home   Disposition:  Home   Recommendations for Outpatient Follow-up:   Follow up with PCP in 1-2 weeks  PCP Please obtain BMP/CBC, 2 view CXR in 1week,  (see Discharge instructions)   PCP Please follow up on the following pending results:    Home Health: PT,RN   Equipment/Devices: has a walker  Consultations: None Discharge Condition: Stable   CODE STATUS: DNR   Diet Recommendation: Heart Healthy     Chief Complaint  Patient presents with  . Back Pain     Brief history of present illness from the day of admission and additional interim summary     Bryan Ford is a 85 y.o. male with medical history significant of AAA status post repair, CKD stage III, GERD, hyperlipidemia, ulcerative colitis and diet-controlled hypertension who presents to the emergency room with left-sided chest pain, flank pain after sustaining fall about a week ago.  Patient does have history of falls and also has ambulatory difficulties.  6 days ago, he was walking in the yard, he slipped and fell onto his back and left side.  He did not have any loss of consciousness, no head injury, workup showed left-sided rib fractures and he was admitted for further care and pain control.                                                                 Hospital Course     1.  Close fracture of multiple ribs: Uncomplicated multiple rib fractures.  Post mechanical fall, treated with supportive care, currently close to baseline, discussed the plan with family and patient bedside he will be discharged home with PRN oral tramadol and Tylenol, home PT and social work will be ordered as well, he has a walker  and advised to use it at all times while ambulating, follow with PCP in a week.  2.  Hypertension: Diet controlled at home.  Will monitor.    Follow with PCP for further care.  3.  CKD stage III: Baseline creatinine about 1.6.  Stable at baseline.  4.  Ulcerative colitis: Symptoms controlled.  On mesalamine.  Will continue.  5.  GERD: On PPI.  Will continue.  6.  AAA status post repair: CT angiogram showed no evidence of complications.   Discharge diagnosis     Principal Problem:   Closed fracture of multiple ribs Active Problems:   AAA (abdominal aortic aneurysm) (HCC)   Hypertension   PAD (peripheral artery disease) (HCC)   Dyslipidemia, goal LDL below 70   CKD (chronic kidney disease), stage III (HCC)   GERD (gastroesophageal reflux disease)   Physical debility   Multiple rib  fractures    Discharge instructions    Discharge Instructions    Diet - low sodium heart healthy   Complete by:  As directed    Discharge instructions   Complete by:  As directed    Follow with Primary MD Deland Pretty, MD in 7 days   Get CBC, CMP, 2 view Chest X ray -  checked  by Primary MD   in 5-7 days    Activity: As tolerated with Full fall precautions use walker/cane & assistance as needed  Disposition Home    Diet: Heart Healthy    Special Instructions: If you have smoked or chewed Tobacco  in the last 2 yrs please stop smoking, stop any regular Alcohol  and or any Recreational drug use.  On your next visit with your primary care physician please Get Medicines reviewed and adjusted.  Please request your Prim.MD to go over all Hospital Tests and Procedure/Radiological results at the follow up, please get all Hospital records sent to your Prim MD by signing hospital release before you go home.  If you experience worsening of your admission symptoms, develop shortness of breath, life threatening emergency, suicidal or homicidal thoughts you must seek medical attention immediately  by calling 911 or calling your MD immediately  if symptoms less severe.  You Must read complete instructions/literature along with all the possible adverse reactions/side effects for all the Medicines you take and that have been prescribed to you. Take any new Medicines after you have completely understood and accpet all the possible adverse reactions/side effects.   Increase activity slowly   Complete by:  As directed       Discharge Medications   Allergies as of 06/17/2018   No Known Allergies     Medication List    TAKE these medications   acetaminophen 325 MG tablet Commonly known as:  TYLENOL Take 650 mg by mouth every 6 (six) hours as needed for mild pain.   dextromethorphan-guaiFENesin 30-600 MG 12hr tablet Commonly known as:  MUCINEX DM Take 1 tablet by mouth 2 (two) times daily as needed for cough.   ferrous sulfate 325 (65 FE) MG tablet Take 1 tablet (325 mg total) by mouth daily.   mesalamine 1.2 g EC tablet Commonly known as:  LIALDA Take 2.4 g by mouth daily with breakfast.   traMADol 50 MG tablet Commonly known as:  ULTRAM Take 1 tablet (50 mg total) by mouth every 12 (twelve) hours as needed.   Vitamin C 500 MG Chew Chew 1 tablet by mouth daily.   Vitamin D3 25 MCG (1000 UT) Chew Chew 1 tablet by mouth daily.       Follow-up Information    Deland Pretty, MD. Schedule an appointment as soon as possible for a visit in 1 week(s).   Specialty:  Internal Medicine Contact information: [address] [place] [address] [phone]           Major procedures and Radiology Reports - PLEASE review detailed and final reports thoroughly  -         Dg Chest 2 View  Result Date: 06/16/2018 CLINICAL DATA:  Fall 1 week ago EXAM: CHEST - 2 VIEW COMPARISON:  Three days ago FINDINGS: Borderline heart size accentuated by low volumes. Lower mediastinal widening from hiatal hernia. Small left pleural effusion. No consolidation or Kerley  lines. No pneumothorax. No visible rib fracture. IMPRESSION: 1. Small left pleural effusion. 2. No visible fracture and no pneumothorax. Electronically Signed  By: Monte Fantasia M.D.   On: 06/16/2018 05:14   Ct Chest Wo Contrast  Result Date: 06/16/2018 CLINICAL DATA:  Upper back pain.  Fall 1 week ago EXAM: CT CHEST WITHOUT CONTRAST TECHNIQUE: Multidetector CT imaging of the chest was performed following the standard protocol without IV contrast. COMPARISON:  07/05/2017 FINDINGS: Cardiovascular: Normal heart size. No pericardial effusion. Multifocal coronary atherosclerosis. Mild aortic calcification. Mediastinum/Nodes: Negative for hematoma or pneumomediastinum. Moderate hiatal hernia. Nodular goiter with asymmetric enlargement of the left lobe extending into the upper mediastinum, stable Lungs/Pleura: Small left pleural effusion with bilateral lower lobe atelectasis. The pleural fluid measures water density. No edema or pneumothorax. Upper Abdomen: As reported on dedicated preceding abdominal CT with contrast Musculoskeletal: Nondisplaced left fifth through tenth rib fractures. Negative for spinal fracture or malalignment. Healed remote right ninth rib fracture. IMPRESSION: 1. Nondisplaced left fifth through tenth rib fractures. 2. Small left pleural effusion and lower lobe atelectasis on both sides. Electronically Signed   By: Monte Fantasia M.D.   On: 06/16/2018 08:33   Ct Abdomen Pelvis W Contrast  Result Date: 06/16/2018 CLINICAL DATA:  Fall 1 week ago with continued abdominal pain EXAM: CT ABDOMEN AND PELVIS WITH CONTRAST TECHNIQUE: Multidetector CT imaging of the abdomen and pelvis was performed using the standard protocol following bolus administration of intravenous contrast. CONTRAST:  37m OMNIPAQUE IOHEXOL 300 MG/ML  SOLN COMPARISON:  07/02/2017 FINDINGS: Lower chest: Borderline heart size. Moderate hiatal hernia. Small left pleural effusion with atelectasis. Hepatobiliary: No focal liver  abnormality.Cholelithiasis without signs of cholecystitis Pancreas: Unremarkable. Spleen: Unremarkable. Adrenals/Urinary Tract: Negative adrenals. No hydronephrosis or stone. Small bilateral renal cystic densities. Negative bladder. Stomach/Bowel: Circumferential low-density thickening of the rectum and sigmoid colon with mild stranding of the adjacent fat. Lymph nodes in the associated mesentery are also prominent. Negative for bowel obstruction. Vascular/Lymphatic: Aorto bi-iliac grafting. There is progressive enhancement within the excluded aneurysm sac consistent with endoleak. Sac dimensions are similar to 2019 comparison at up to 51 mm. Reproductive:Symmetric enlargement of the prostate gland Other: No ascites or pneumoperitoneum. Musculoskeletal: Lateral left seventh through tenth rib fractures, nondisplaced. Advanced spinal degeneration with lower lumbar canal and foraminal impingement IMPRESSION: 1. Lateral left seventh through tenth rib fractures, nondisplaced. There is associated atelectasis and small effusion at the left base. 2. Sigmoid colitis and proctitis with hypertrophic fat correlating with history of ulcerative colitis. 3. No traumatic finding within the abdomen. 4. Treated infrarenal aortic aneurysm with endoleak. The aneurysm sac size is stable from February 2019. Electronically Signed   By: JMonte FantasiaM.D.   On: 06/16/2018 08:19    Micro Results     No results found for this or any previous visit (from the past 240 hour(s)).  Today   Subjective    Bryan Mckusicktoday has no headache,no chest abdominal pain,no new weakness tingling or numbness, feels much better wants to go home today.     Objective   Blood pressure (!) 160/75, pulse 68, temperature 99 F (37.2 C), resp. rate 18, height 5' 8"  (1.727 m), weight 77.1 kg, SpO2 99 %.   Intake/Output Summary (Last 24 hours) at 06/17/2018 0908 Last data filed at 06/16/2018 2039 Gross per 24 hour  Intake -  Output 550 ml  Net  -550 ml    Exam  Awake Alert, Oriented x 3, No new F.N deficits, Normal affect [place].AT,PERRAL Supple Neck,No JVD, No cervical lymphadenopathy appriciated.  Symmetrical Chest wall movement, Good air movement bilaterally, CTAB RRR,No Gallops,Rubs or new  Murmurs, No Parasternal Heave +ve B.Sounds, Abd Soft, Non tender, No organomegaly appriciated, No rebound -guarding or rigidity. No Cyanosis, Clubbing or edema, No new Rash or bruise   Data Review   CBC w Diff:  Lab Results  Component Value Date   WBC 9.2 06/16/2018   HGB 9.1 (L) 06/16/2018   HCT 30.3 (L) 06/16/2018   PLT 293 06/16/2018   LYMPHOPCT 22 09/09/2017   MONOPCT 5 09/09/2017   EOSPCT 5 09/09/2017   BASOPCT 1 09/09/2017    CMP:  Lab Results  Component Value Date   NA 142 06/17/2018   K 3.8 06/17/2018   CL 110 06/17/2018   CO2 22 06/17/2018   BUN 18 06/17/2018   CREATININE 1.66 (H) 06/17/2018   CREATININE 1.41 (H) 11/07/2015   PROT 6.4 (L) 06/16/2018   ALBUMIN 3.0 (L) 06/16/2018   BILITOT 0.6 06/16/2018   ALKPHOS 42 06/16/2018   AST 14 (L) 06/16/2018   ALT 12 06/16/2018  .   Total Time in preparing paper work, data evaluation and todays exam - 83 minutes  Lala Lund M.D on 06/17/2018 at 9:08 AM  [facility]   Office  [phone]

## 2018-06-17 NOTE — Evaluation (Signed)
Occupational Therapy Evaluation Patient Details Name: Bryan Ford MRN: [id number] DOB: [birth year] Today's Date: 06/17/2018    History of Present Illness Pt is a 85 y/o male admitted secondary to sustaining a fall at home approximately one week ago. Pt found to have L 5-10 rib fxs. PMH including but not limited to HTN, CKD, UC   Clinical Impression   PTA Pt with varying levels of independence "good/bad days" used rollator and while largely independent in ADL - needed assist with LB ADL PRN which wife is able to provide. Pt's wife and daughter DO report increasing falls in and outside the home recently. Pt currently requires max A x2 for bed mobility, min guard for transfers and mod A x2 to ambulate a short distance with RW. Due to rib pain, he is max A for LB ADL and required cues for safety and sequencing throughout session. He was also lethargic, falling asleep almost immediately upon sitting (medication?) but did joke a little throughout the session. Everything takes increased time, effort, and cues. Pt would benefit from skilled OT in the acute setting and post-acute (SNF would be recommended due to generalized weakness/deconditioning and due to the fact that Pt has to go up 5 steps to enter the house as well as a flight of steps to access a full shower - Pt and wife Ok sponge bathing downstairs). OT will continue to follow acutely and HHOT will be essential to maximize safety and independence for patient as well as caregiver.    Follow Up Recommendations  SNF;Supervision/Assistance - 24 hour;Home health OT(Pt's family adament about Home Discharge due to OBS status)    Equipment Recommendations  3 in 1 bedside commode    Recommendations for Other Services       Precautions / Restrictions Precautions Precautions: Fall Precaution Comments: L rib fxs Restrictions Weight Bearing Restrictions: No      Mobility Bed Mobility Overal bed mobility: Needs Assistance Bed Mobility: Supine to  Sit     Supine to sit: Max assist;+2 for physical assistance     General bed mobility comments: increased time and effort, HOB elevated, pt able to initiate bilateral LE movement towards EOB, constant multimodal cueing required, assistance needed to move bilateral LEs off of bed and for trunk elevation; sat EOB towards pt's R side  Transfers Overall transfer level: Needs assistance Equipment used: 2 person hand held assist Transfers: Sit to/from Stand Sit to Stand: Min guard;From elevated surface         General transfer comment: increased time, pt maintaining a forward flexed posture throughout, min guard for safety    Balance Overall balance assessment: History of Falls;Needs assistance Sitting-balance support: Feet supported Sitting balance-Leahy Scale: Fair     Standing balance support: During functional activity;Bilateral upper extremity supported;Single extremity supported Standing balance-Leahy Scale: Poor                             ADL either performed or assessed with clinical judgement   ADL Overall ADL's : Needs assistance/impaired Eating/Feeding: Set up;Sitting   Grooming: Set up;Sitting Grooming Details (indicate cue type and reason): able to maintain brief periods of static standing without BUE assist - but for safety requires seated ADL Upper Body Bathing: Set up;Sitting   Lower Body Bathing: Moderate assistance;With caregiver independent assisting   Upper Body Dressing : Set up;With caregiver independent assisting;Sitting   Lower Body Dressing: Moderate assistance;With caregiver independent assisting;Sit to/from stand  Toilet Transfer: Moderate assistance;Ambulation;RW Toilet Transfer Details (indicate cue type and reason): constant cues and assist for safety with balance and RW Toileting- Clothing Manipulation and Hygiene: Maximal assistance;Sit to/from stand Toileting - Clothing Manipulation Details (indicate cue type and reason): at  baseline, Pt wears depends due to urgency     Functional mobility during ADLs: Moderate assistance;Cueing for safety;Cueing for sequencing;Rolling walker       Vision Patient Visual Report: No change from baseline       Perception     Praxis      Pertinent Vitals/Pain Pain Assessment: Faces Faces Pain Scale: Hurts little more Pain Location: L trunk/ribs Pain Descriptors / Indicators: Discomfort;Guarding Pain Intervention(s): Monitored during session;Repositioned     Hand Dominance Right   Extremity/Trunk Assessment Upper Extremity Assessment Upper Extremity Assessment: Generalized weakness   Lower Extremity Assessment Lower Extremity Assessment: Defer to PT evaluation   Cervical / Trunk Assessment Cervical / Trunk Assessment: Kyphotic;Other exceptions Cervical / Trunk Exceptions: L 5-10 rib fxs   Communication Communication Communication: HOH   Cognition Arousal/Alertness: Lethargic Behavior During Therapy: WFL for tasks assessed/performed Overall Cognitive Status: Impaired/Different from baseline Area of Impairment: Following commands;Safety/judgement;Awareness;Problem solving                       Following Commands: Follows one step commands with increased time Safety/Judgement: Decreased awareness of safety;Decreased awareness of deficits Awareness: Intellectual Problem Solving: Slow processing;Decreased initiation;Difficulty sequencing;Requires verbal cues;Requires tactile cues     General Comments  educated wife and daughters about concerns for safety and need to get extra family/friends involved    Exercises     Shoulder Instructions      Home Living Family/patient expects to be discharged to:: Private residence Living Arrangements: Spouse/significant other Available Help at Discharge: Family;Available PRN/intermittently Type of Home: House Home Access: Stairs to enter [employer] of Steps: 5 Entrance Stairs-Rails:  Right;Left;Can reach both Home Layout: Able to live on main level with bedroom/bathroom;1/2 bath on main level     Bathroom Shower/Tub: Walk-in shower;Other (comment)(upstairs)   Bathroom Toilet: Standard     Home Equipment: Shower seat;Wheelchair - [employer] - 4 wheels          Prior Functioning/Environment Level of Independence: Needs assistance  Gait / Transfers Assistance Needed: ambulates with a rollator PRN ADL's / Homemaking Assistance Needed: occasionally needs assistance with LB dressing            OT Problem List: Decreased strength;Decreased range of motion;Decreased activity tolerance;Impaired balance (sitting and/or standing);Decreased safety awareness;Pain      OT Treatment/Interventions: Self-care/ADL training;Therapeutic exercise;DME and/or AE instruction;Therapeutic activities;Patient/family education;Balance training    OT Goals(Current goals can be found in the care plan section) Acute Rehab OT Goals Patient Stated Goal: to prevent falls OT Goal Formulation: With family Time For Goal Achievement: 07/01/18 Potential to Achieve Goals: Fair ADL Goals Pt Will Perform Upper Body Dressing: with modified independence;sitting Pt Will Perform Lower Body Dressing: with supervision;with caregiver independent in assisting;sit to/from stand Pt Will Transfer to Toilet: with supervision;ambulating Pt Will Perform Toileting - Clothing Manipulation and hygiene: with modified independence;sit to/from stand Additional ADL Goal #1: Pt will perform bed mobility at min A prior to engaging in ADL  OT Frequency: Min 3X/week   Barriers to D/C:    Pt needs increased assist at home       Co-evaluation PT/OT/SLP Co-Evaluation/Treatment: Yes Reason for Co-Treatment: For patient/therapist safety;To address functional/ADL transfers PT goals addressed during session: Mobility/safety with mobility;Balance;Proper  use of DME OT goals addressed during session: ADL's and  self-care;Proper use of Adaptive equipment and DME      AM-PAC OT "6 Clicks" Daily Activity     Outcome Measure Help from another person eating meals?: None Help from another person taking care of personal grooming?: A Little Help from another person toileting, which includes using toliet, bedpan, or urinal?: A Lot Help from another person bathing (including washing, rinsing, drying)?: A Lot Help from another person to put on and taking off regular upper body clothing?: None Help from another person to put on and taking off regular lower body clothing?: A Lot 6 Click Score: 17   End of Session Equipment Utilized During Treatment: Gait belt;Rolling walker Nurse Communication: Mobility status  Activity Tolerance: Patient tolerated treatment well;Patient limited by lethargy Patient left: in chair;with call bell/phone within reach;with family/visitor present  OT Visit Diagnosis: Unsteadiness on feet (R26.81);Other abnormalities of gait and mobility (R26.89);Repeated falls (R29.6);History of falling (Z91.81);Muscle weakness (generalized) (M62.81);Other symptoms and signs involving cognitive function;Pain Pain - part of body: (ribs)                Time: 1587-2761 OT Time Calculation (min): 60 min Charges:  OT General Charges $OT Visit: 1 Visit OT Evaluation $OT Eval Moderate Complexity: 1 Mod OT Treatments $Therapeutic Activity: 8-22 mins Hulda Humphrey OTR/L Acute Rehabilitation Services Pager: [phone] Office: [place] 06/17/2018, 1:58 PM

## 2018-06-17 NOTE — Evaluation (Signed)
Physical Therapy Evaluation Patient Details Name: Bryan Ford MRN: [id number] DOB: Sep 17, 1925 Today's Date: 06/17/2018   History of Present Illness  Pt is a 85 y/o male admitted secondary to sustaining a fall at home approximately one week ago. Pt found to have L 5-10 rib fxs. PMH including but not limited to HTN, CKD, UC    Clinical Impression  Pt presented supine in bed with HOB elevated, awake and willing to participate in therapy session. Pt's spouse and daughter present throughout session as well. Prior to admission and prior to this recent fall, pt ambulated with a rollator PRN and was mostly independent with ADLs (only required occasional assistance with lower body). Pt currently requires max A x2 for bed mobility, min guard for transfers and mod A x2 to ambulate a short distance with RW. Based on pt's functional abilities during evaluation, PT recommending pt d/c to SNF for short term rehab prior to returning home with family support. However, pt's family very adamant about returning home today or tomorrow. Discussed at length various options and additional safety measures for home. Provided pt's wife with a gait belt as well. Pt would continue to benefit from skilled physical therapy services at this time while admitted and after d/c to address the below listed limitations in order to improve overall safety and independence with functional mobility.     Follow Up Recommendations SNF;Supervision/Assistance - 24 hour;Other (comment)(family adamant about returning home; will need HHPT/HHOT)    Equipment Recommendations  Rolling walker with 5" wheels;3in1 (PT)    Recommendations for Other Services       Precautions / Restrictions Precautions Precautions: Fall Precaution Comments: L rib fxs Restrictions Weight Bearing Restrictions: No      Mobility  Bed Mobility Overal bed mobility: Needs Assistance Bed Mobility: Supine to Sit     Supine to sit: Max assist;+2 for physical  assistance     General bed mobility comments: increased time and effort, HOB elevated, pt able to initiate bilateral LE movement towards EOB, constant multimodal cueing required, assistance needed to move bilateral LEs off of bed and for trunk elevation; sat EOB towards pt's R side  Transfers Overall transfer level: Needs assistance Equipment used: 2 person hand held assist Transfers: Sit to/from Stand Sit to Stand: Min guard;From elevated surface         General transfer comment: increased time, pt maintaining a forward flexed posture throughout, min guard for safety  Ambulation/Gait Ambulation/Gait assistance: Mod assist;+2 safety/equipment;+2 physical assistance Gait Distance (Feet): 50 Feet Assistive device: Rolling walker (2 wheeled) Gait Pattern/deviations: Step-to pattern;Step-through pattern;Decreased step length - right;Decreased step length - left;Decreased stride length;Shuffle;Trunk flexed Gait velocity: very slow   General Gait Details: pt with very slow gait, shuffling with very short step length bilaterally, flexed posture maintained, constant cueing to maintain body within frame of RW  Stairs            Wheelchair Mobility    Modified Rankin (Stroke Patients Only)       Balance Overall balance assessment: History of Falls;Needs assistance Sitting-balance support: Feet supported Sitting balance-Leahy Scale: Fair     Standing balance support: During functional activity;Bilateral upper extremity supported;Single extremity supported Standing balance-Leahy Scale: Poor                               Pertinent Vitals/Pain Pain Assessment: Faces Faces Pain Scale: Hurts little more Pain Location: L trunk/ribs Pain Descriptors / Indicators:  Discomfort;Guarding Pain Intervention(s): Monitored during session;Repositioned    Home Living Family/patient expects to be discharged to:: Private residence Living Arrangements: Spouse/significant  other Available Help at Discharge: Family;Available PRN/intermittently Type of Home: House Home Access: Stairs to enter Entrance Stairs-Rails: Right;Left;Can reach both Entrance Stairs-Number of Steps: 5 Home Layout: Able to live on main level with bedroom/bathroom;1/2 bath on main level Home Equipment: Shower seat;Wheelchair - [employer] - 4 wheels      Prior Function Level of Independence: Needs assistance   Gait / Transfers Assistance Needed: ambulates with a rollator PRN  ADL's / Homemaking Assistance Needed: occasionally needs assistance with LB dressing        Hand Dominance        Extremity/Trunk Assessment   Upper Extremity Assessment Upper Extremity Assessment: Defer to OT evaluation    Lower Extremity Assessment Lower Extremity Assessment: Generalized weakness    Cervical / Trunk Assessment Cervical / Trunk Assessment: Kyphotic;Other exceptions Cervical / Trunk Exceptions: L 5-10 rib fxs  Communication   Communication: HOH  Cognition Arousal/Alertness: Lethargic Behavior During Therapy: WFL for tasks assessed/performed Overall Cognitive Status: Impaired/Different from baseline Area of Impairment: Following commands;Safety/judgement;Awareness;Problem solving                       Following Commands: Follows one step commands with increased time Safety/Judgement: Decreased awareness of safety;Decreased awareness of deficits Awareness: Intellectual Problem Solving: Slow processing;Decreased initiation;Difficulty sequencing;Requires verbal cues;Requires tactile cues        General Comments      Exercises     Assessment/Plan    PT Assessment Patient needs continued PT services  PT Problem List Decreased strength;Decreased activity tolerance;Decreased balance;Decreased mobility;Decreased coordination;Decreased cognition;Decreased knowledge of use of DME;Decreased safety awareness;Decreased knowledge of precautions       PT Treatment  Interventions DME instruction;Gait training;Stair training;Functional mobility training;Therapeutic activities;Therapeutic exercise;Balance training;Neuromuscular re-education;Patient/family education    PT Goals (Current goals can be found in the Care Plan section)  Acute Rehab PT Goals Patient Stated Goal: to prevent falls PT Goal Formulation: With patient/family Time For Goal Achievement: 07/01/18 Potential to Achieve Goals: Good    Frequency Min 2X/week   Barriers to discharge        Co-evaluation PT/OT/SLP Co-Evaluation/Treatment: Yes Reason for Co-Treatment: For patient/therapist safety;To address functional/ADL transfers PT goals addressed during session: Mobility/safety with mobility;Balance;Proper use of DME;Strengthening/ROM         AM-PAC PT "6 Clicks" Mobility  Outcome Measure Help needed turning from your back to your side while in a flat bed without using bedrails?: A Lot Help needed moving from lying on your back to sitting on the side of a flat bed without using bedrails?: A Lot Help needed moving to and from a bed to a chair (including a wheelchair)?: A Lot Help needed standing up from a chair using your arms (e.g., wheelchair or bedside chair)?: A Little Help needed to walk in hospital room?: A Lot Help needed climbing 3-5 steps with a railing? : Total 6 Click Score: 12    End of Session Equipment Utilized During Treatment: Gait belt Activity Tolerance: Patient limited by fatigue;Patient limited by pain Patient left: in chair;with call bell/phone within reach;with family/visitor present Nurse Communication: Mobility status PT Visit Diagnosis: History of falling (Z91.81);Repeated falls (R29.6);Other abnormalities of gait and mobility (R26.89)    Time: 6468-0321 PT Time Calculation (min) (ACUTE ONLY): 63 min   Charges:   PT Evaluation $PT Eval Moderate Complexity: 1 Mod PT Treatments $Therapeutic Activity: 8-22  mins        Sherie Don, [place],  DPT  Acute Rehabilitation Services Pager [phone] Office [place] 06/17/2018, 1:25 PM

## 2018-06-17 NOTE — Progress Notes (Signed)
Subjective: Patient just had a pudding like BM in bed on himself as I walked in.  He knows he's in the hospital, but isn't oriented to time or situation really.  He denies chest pain.  No IS in his room.  Objective: Vital signs in last 24 hours: Temp:  [98.1 F (36.7 C)-99 F (37.2 C)] 99 F (37.2 C) (02/07 0522) Pulse Rate:  [68-88] 68 (02/07 0522) Resp:  [16-20] 18 (02/07 0522) BP: (137-184)/(64-85) 160/75 (02/07 0522) SpO2:  [96 %-100 %] 99 % (02/07 0522) Last BM Date: 06/16/18  Intake/Output from previous day: 02/06 0701 - 02/07 0700 In: -  Out: 1050 [Urine:1050] Intake/Output this shift: No intake/output data recorded.  PE: Gen: NAD Heart: regular Lungs: CTAB, but decrease at breath sounds at bases from shallow inspiration, some left-sided chest wall tenderness more posterior, lateral. Abd: soft, NT, ND GU: condom cath in place with clear yellow urine Ext: mepilex boarders in place over abrasions on shins.  Lab Results:  Recent Labs    06/16/18 0502  WBC 9.2  HGB 9.1*  HCT 30.3*  PLT 293   BMET Recent Labs    06/16/18 0502 06/17/18 0440  NA 144 142  K 3.3* 3.8  CL 112* 110  CO2 21* 22  GLUCOSE 114* 99  BUN 22 18  CREATININE 1.65* 1.66*  CALCIUM 8.7* 8.6*   PT/INR No results for input(s): LABPROT, INR in the last 72 hours. CMP     Component Value Date/Time   NA 142 06/17/2018 0440   K 3.8 06/17/2018 0440   CL 110 06/17/2018 0440   CO2 22 06/17/2018 0440   GLUCOSE 99 06/17/2018 0440   BUN 18 06/17/2018 0440   CREATININE 1.66 (H) 06/17/2018 0440   CREATININE 1.41 (H) 11/07/2015 1202   CALCIUM 8.6 (L) 06/17/2018 0440   PROT 6.4 (L) 06/16/2018 0502   ALBUMIN 3.0 (L) 06/16/2018 0502   AST 14 (L) 06/16/2018 0502   ALT 12 06/16/2018 0502   ALKPHOS 42 06/16/2018 0502   BILITOT 0.6 06/16/2018 0502   GFRNONAA 35 (L) 06/17/2018 0440   GFRAA 41 (L) 06/17/2018 0440   Lipase     Component Value Date/Time   LIPASE 28 09/09/2017 1451        Studies/Results: Dg Chest 2 View  Result Date: 06/16/2018 CLINICAL DATA:  Fall 1 week ago EXAM: CHEST - 2 VIEW COMPARISON:  Three days ago FINDINGS: Borderline heart size accentuated by low volumes. Lower mediastinal widening from hiatal hernia. Small left pleural effusion. No consolidation or Kerley lines. No pneumothorax. No visible rib fracture. IMPRESSION: 1. Small left pleural effusion. 2. No visible fracture and no pneumothorax. Electronically Signed   By: Monte Fantasia M.D.   On: 06/16/2018 05:14   Ct Chest Wo Contrast  Result Date: 06/16/2018 CLINICAL DATA:  Upper back pain.  Fall 1 week ago EXAM: CT CHEST WITHOUT CONTRAST TECHNIQUE: Multidetector CT imaging of the chest was performed following the standard protocol without IV contrast. COMPARISON:  07/05/2017 FINDINGS: Cardiovascular: Normal heart size. No pericardial effusion. Multifocal coronary atherosclerosis. Mild aortic calcification. Mediastinum/Nodes: Negative for hematoma or pneumomediastinum. Moderate hiatal hernia. Nodular goiter with asymmetric enlargement of the left lobe extending into the upper mediastinum, stable Lungs/Pleura: Small left pleural effusion with bilateral lower lobe atelectasis. The pleural fluid measures water density. No edema or pneumothorax. Upper Abdomen: As reported on dedicated preceding abdominal CT with contrast Musculoskeletal: Nondisplaced left fifth through tenth rib fractures. Negative for  spinal fracture or malalignment. Healed remote right ninth rib fracture. IMPRESSION: 1. Nondisplaced left fifth through tenth rib fractures. 2. Small left pleural effusion and lower lobe atelectasis on both sides. Electronically Signed   By: Monte Fantasia M.D.   On: 06/16/2018 08:33   Ct Abdomen Pelvis W Contrast  Result Date: 06/16/2018 CLINICAL DATA:  Fall 1 week ago with continued abdominal pain EXAM: CT ABDOMEN AND PELVIS WITH CONTRAST TECHNIQUE: Multidetector CT imaging of the abdomen and pelvis was  performed using the standard protocol following bolus administration of intravenous contrast. CONTRAST:  95m OMNIPAQUE IOHEXOL 300 MG/ML  SOLN COMPARISON:  07/02/2017 FINDINGS: Lower chest: Borderline heart size. Moderate hiatal hernia. Small left pleural effusion with atelectasis. Hepatobiliary: No focal liver abnormality.Cholelithiasis without signs of cholecystitis Pancreas: Unremarkable. Spleen: Unremarkable. Adrenals/Urinary Tract: Negative adrenals. No hydronephrosis or stone. Small bilateral renal cystic densities. Negative bladder. Stomach/Bowel: Circumferential low-density thickening of the rectum and sigmoid colon with mild stranding of the adjacent fat. Lymph nodes in the associated mesentery are also prominent. Negative for bowel obstruction. Vascular/Lymphatic: Aorto bi-iliac grafting. There is progressive enhancement within the excluded aneurysm sac consistent with endoleak. Sac dimensions are similar to 2019 comparison at up to 51 mm. Reproductive:Symmetric enlargement of the prostate gland Other: No ascites or pneumoperitoneum. Musculoskeletal: Lateral left seventh through tenth rib fractures, nondisplaced. Advanced spinal degeneration with lower lumbar canal and foraminal impingement IMPRESSION: 1. Lateral left seventh through tenth rib fractures, nondisplaced. There is associated atelectasis and small effusion at the left base. 2. Sigmoid colitis and proctitis with hypertrophic fat correlating with history of ulcerative colitis. 3. No traumatic finding within the abdomen. 4. Treated infrarenal aortic aneurysm with endoleak. The aneurysm sac size is stable from February 2019. Electronically Signed   By: JMonte FantasiaM.D.   On: 06/16/2018 08:19    Anti-infectives: Anti-infectives (From admission, onward)   None       Assessment/Plan Fall, increasing frequency Left-sided rib FXs 5-10 - pain control, IS, PT/OT evaluation Bilateral lower extremity abrasions and edema - these do not  appear infected.  bacitracin BID and cover CKD, stage 3 - per medicine UC - per medicine Memory deficit - per wife this has been worsening in the last year HTN - per medicine AAA with stented endoleak - stable for CT Anemia - hgb 9.1.  Appears to be stable from hgb when here last February FEN - heart health diet VTE  - Lovenox ID - none needed currently  Further dispo per medicine   LOS: 0 days    KHenreitta Cea, PLahey Clinic Medical CenterSurgery 06/17/2018, 8:00 AM Pager: [phone]

## 2018-06-17 NOTE — Care Management Obs Status (Signed)
[facility] NOTIFICATION   Patient Details  Name: VICTORINO FATZINGER MRN: [id number] Date of Birth: 1925/07/05   Medicare Observation Status Notification Given:  Yes    Bethena Roys, RN 06/17/2018, 12:39 PM

## 2018-06-17 NOTE — Discharge Instructions (Signed)
Follow with Primary MD Deland Pretty, MD in 7 days   Get CBC, CMP, 2 view Chest X ray -  checked  by Primary MD   in 5-7 days    Activity: As tolerated with Full fall precautions use walker/cane & assistance as needed  Disposition Home    Diet: Heart Healthy    Special Instructions: If you have smoked or chewed Tobacco  in the last 2 yrs please stop smoking, stop any regular Alcohol  and or any Recreational drug use.  On your next visit with your primary care physician please Get Medicines reviewed and adjusted.  Please request your Prim.MD to go over all Hospital Tests and Procedure/Radiological results at the follow up, please get all Hospital records sent to your Prim MD by signing hospital release before you go home.  If you experience worsening of your admission symptoms, develop shortness of breath, life threatening emergency, suicidal or homicidal thoughts you must seek medical attention immediately by calling 911 or calling your MD immediately  if symptoms less severe.  You Must read complete instructions/literature along with all the possible adverse reactions/side effects for all the Medicines you take and that have been prescribed to you. Take any new Medicines after you have completely understood and accpet all the possible adverse reactions/side effects.

## 2018-06-17 NOTE — Progress Notes (Signed)
Bryan Ford to be D/C'd home  per MD order.  Discussed with the patient and all questions fully answered.  VSS, Skin clean, dry and intact without evidence of skin break down, no evidence of skin tears noted. IV catheter discontinued intact. Site without signs and symptoms of complications. Dressing and pressure applied.  An After Visit Summary was printed and given to the patient. Patient received prescription.  D/c education completed with patient/family including follow up instructions, medication list, d/c activities limitations if indicated, with other d/c instructions as indicated by MD - patient able to verbalize understanding, all questions fully answered.   Patient instructed to return to ED, call 911, or call MD for any changes in condition.   Patient escorted via [place], and D/C home via private auto.   Marnae Madani A Odetta Pink Montejano 06/17/2018 2:25 PM

## 2018-07-01 ENCOUNTER — Encounter: Payer: Self-pay | Admitting: Cardiology

## 2018-07-01 ENCOUNTER — Ambulatory Visit: Payer: Medicare Other | Admitting: Cardiology

## 2018-07-01 VITALS — BP 169/80 | HR 63 | Ht 66.0 in | Wt 164.0 lb

## 2018-07-01 DIAGNOSIS — I517 Cardiomegaly: Secondary | ICD-10-CM

## 2018-07-01 DIAGNOSIS — I714 Abdominal aortic aneurysm, without rupture, unspecified: Secondary | ICD-10-CM

## 2018-07-01 DIAGNOSIS — I2510 Atherosclerotic heart disease of native coronary artery without angina pectoris: Secondary | ICD-10-CM | POA: Diagnosis not present

## 2018-07-01 NOTE — Patient Instructions (Signed)
Medication Instructions:  Not needed If you need a refill on your cardiac medications before your next appointment, please call your pharmacy.   Lab work: Not needed If you have labs (blood work) drawn today and your tests are completely normal, you will receive your results only by: Marland Kitchen MyChart Message (if you have MyChart) OR . A paper copy in the mail If you have any lab test that is abnormal or we need to change your treatment, we will call you to review the results.  Testing/Procedures: Not needed  Follow-Up: At [facility], you and your health needs are our priority.  As part of our continuing mission to provide you with exceptional heart care, we have created designated Provider Care Teams.  These Care Teams include your primary Cardiologist (physician) and Advanced Practice Providers (APPs -  Physician Assistants and Nurse Practitioners) who all work together to provide you with the care you need, when you need it. . Your physician recommends that you schedule a follow-up appointment on an as needed basis .   Any Other Special Instructions Will Be Listed Below (If Applicable).

## 2018-07-01 NOTE — Progress Notes (Signed)
PCP: Deland Pretty, MD  Clinic Note: Chief Complaint  Patient presents with  . New Patient (Initial Visit)    Abnormal CT of the chest-: Cardiomegaly and coronary calcification  . Edema    ankles, hand.     HPI: ANGUEL DELAPENA is a 85 y.o. male who is being seen today for the evaluation of CT SCAN FINDINGS SHOWING CARDIOMEGALY AND THREE-VESSEL CAD/CALCIFICATION at the request of Valinda Party, MD.   Armanda Magic was actually seen by me for a visit in June 2014.  This was an evaluation for AAA.  We ordered an abdominal aortic duplex and a statin.  There was evidence of AAA and he was referred to Dr. Trula Slade from vascular surgery and underwent EVAR in August 2014.  He has not followed back up after the surgery.  He has CKD-3, hyperlipidemia diet-controlled hypertension as well as ulcerative colitis and  Recent Hospitalizations:   February 6-7, 2020 -> he slipped and fell on his back while working in the yard.  No loss of consciousness.  Left-sided rib fractures admitted for further care.  Studies Personally Reviewed - (if available, images/films reviewed: From Epic Chart or Care Everywhere)  None  Interval History: Iwao presents here today for cardiology evaluation with an abnormal cardiac CT scan.  There is suggestion of cardiomegaly.  CT scan showed calcification.  Zaheer Wageman had a fall walking in the backyard.  He landed on something and broke some of his ribs on the left side.  He had no loss of consciousness.  He just lost his balance which is been an issue for a while.  He now has left-sided rib cage pain but has not had any cardiac symptoms. As part of his evaluation he was found to have some cardiomegaly and coronary calcification.  For the most part he is pretty much asymptomatic other than the pain from the broken ribs.  He is not limited as far as mobility from a cardiac standpoint as far as any chest tightness or pressure.  No resting or exertional dyspnea, unless  he exerts himself vigorously.  He only takes Tylenol for his rib cage pain.  I reviewed cardiovascular review of symptoms with Younes and confirmed with his wife and daughter.  Prior to his fall he is had a couple the falls because of balance issues, but has not had any complaints of a cardiac standpoint: None of these falls are related with any syncope or near syncope.  No sensation of rapid irregular heartbeats or palpitations.Marland Kitchen He is very active for 85 year old, he still enjoys doing yard work and walking.  A lot of the walking is limited by the fact that he uses a rolling walker or a cane.  To the extent that he is active, he denies any chest pain or pressure with rest or exertion.  No resting or exertional dyspnea as noted above.  No PND, orthopnea with only some mild chronic lower extremity edema that is not new for him.  No TIA/amaurosis fugax symptoms. No melena, hematochezia, hematuria, or epstaxis. No claudication.  Actually other than the discomfort from his rib fractures that he treats with Tylenol, he is pretty much asymptomatic and doing what he would like to do.  ROS: A comprehensive was performed. Review of Systems  Constitutional: Negative for malaise/fatigue.  HENT: Negative for congestion and nosebleeds.   Respiratory: Positive for shortness of breath (Only if he overdoes it). Negative for cough.   Cardiovascular: Positive for leg swelling (Chronic  bilateral lower extremity swelling).  Musculoskeletal: Positive for back pain and joint pain.  Neurological: Positive for weakness (Legs are little weak and than they had been).  Psychiatric/Behavioral: Positive for memory loss (But still pretty sharp). Negative for depression. The patient is not nervous/anxious.   All other systems reviewed and are negative.  I have reviewed and (if needed) personally updated the patient's problem list, medications, allergies, past medical and surgical history, social and family history.   Past  Medical History:  Diagnosis Date  . AAA (abdominal aortic aneurysm) ([place])   . AAA (abdominal aortic aneurysm) without rupture ([facility])    Abdominal ultrasound showed an increase from 4.1 to 5.3 cm diameter AAA.  Marland Kitchen Borderline hypertension   . Chronic kidney disease, stage 3 ([place])   . ED (erectile dysfunction)   . Esophageal reflux   . History of kidney stones   . Hyperlipidemia   . Pneumonia 08-2013  . Ulcerative colitis ([place])    Takes mesalamine    Past Surgical History:  Procedure Laterality Date  . ABDOMINAL AORTIC ENDOVASCULAR STENT GRAFT N/A 12/16/2012   Procedure: ABDOMINAL AORTIC ENDOVASCULAR STENT GRAFT-GORE;  Surgeon: Serafina Mitchell, MD;  Location: [facility];  Service: Vascular;  Laterality: N/A;  Ultrasound guided  . BACK SURGERY    . ESOPHAGOGASTRODUODENOSCOPY N/A 12/26/2013   Procedure: ESOPHAGOGASTRODUODENOSCOPY (EGD);  Surgeon: Winfield Cunas., MD;  Location: [facility];  Service: Endoscopy;  Laterality: N/A;  . FRACTURE SURGERY    . KIDNEY STONE SURGERY  1970s   "never opened him up"  . MANDIBLE FRACTURE SURGERY  college  . MAXIMUM ACCESS (MAS)POSTERIOR LUMBAR INTERBODY FUSION (PLIF) 1 LEVEL  03/2000   L5-S1 laminectomy, diskectomy, posterior lumbar body fusion with decompressive lumbar laminectomy at L5-S1/notes 09/24/2010  . SAVORY DILATION N/A 12/26/2013   Procedure: SAVORY DILATION;  Surgeon: Winfield Cunas., MD;  Location: [facility];  Service: Endoscopy;  Laterality: N/A;  . TONSILLECTOMY      Current Meds  Medication Sig  . acetaminophen (TYLENOL) 325 MG tablet Take 650 mg by mouth every 6 (six) hours as needed for mild pain.  . Ascorbic Acid (VITAMIN C) 500 MG CHEW Chew 1 tablet by mouth daily.  . Cholecalciferol (VITAMIN D3) 1000 units CHEW Chew 1 tablet by mouth daily.  Marland Kitchen dextromethorphan-guaiFENesin (MUCINEX DM) 30-600 MG 12hr tablet Take 1 tablet by mouth 2 (two) times daily as needed for cough.  . ferrous sulfate 325 (65 FE) MG tablet Take 1 tablet  (325 mg total) by mouth daily.  . mesalamine (LIALDA) 1.2 G EC tablet Take 2.4 g by mouth daily with breakfast.   . traMADol (ULTRAM) 50 MG tablet Take 1 tablet (50 mg total) by mouth every 12 (twelve) hours as needed.    No Known Allergies  Social History   Tobacco Use  . Smoking status: Former Smoker    Years: 20.00    Types: Cigarettes, Cigars    Last attempt to quit: 11/01/1973    Years since quitting: 44.7  . Smokeless tobacco: Never Used  Substance Use Topics  . Alcohol use: Yes    Comment: 07/02/2017 occasionally, but not every week  . Drug use: No   Social History   Social History Narrative   7 range of motion. Very is worse on the ER all day, including mowing the lawn. Also plays golf and walks regularly.    family history includes Cancer in his mother.  Wt Readings from Last 3 Encounters:  07/01/18  164 lb (74.4 kg)  06/16/18 170 lb (77.1 kg)  07/07/17 160 lb 0.9 oz (72.6 kg)    PHYSICAL EXAM BP (!) 169/80   Pulse 63   Ht 5' 6"  (1.676 m)   Wt 164 lb (74.4 kg)   BMI 26.47 kg/m  Physical Exam  Constitutional: He is oriented to person, place, and time. No distress.  Relatively healthy appearing for his age.  He has some kyphosis.  Well-groomed.  Well-nourished  HENT:  Head: Normocephalic and atraumatic.  Mild puffy swelling of the right cheek.  Very hard of hearing  Eyes: Conjunctivae and EOM are normal.  Bilateral pupils are somewhat irregular  Neck: Normal range of motion. Neck supple. No hepatojugular reflux and no JVD present. Carotid bruit is present (Soft bilateral bruit versus radiated aortic murmur).  Cardiovascular: Normal rate and regular rhythm.  Occasional extrasystoles are present. PMI is not displaced. Exam reveals decreased pulses (Mildly decreased pedal pulses). Exam reveals no gallop and no friction rub.  Murmur (1-2/6 SEM at RUSB) heard. Pulmonary/Chest: Effort normal and breath sounds normal. No respiratory distress. He has no wheezes. He  has no rales.  Nonlabored.  Good air movement  Abdominal: Soft. Bowel sounds are normal. He exhibits no distension. There is no abdominal tenderness. There is no rebound.  No HSM  Musculoskeletal: Normal range of motion.        General: Edema (2+ right lower extremity, 1+ left lower extremity mild pitting edema.) present.  Neurological: He is alert and oriented to person, place, and time. No cranial nerve deficit (With the exception of hard of hearing).  Skin: Skin is warm and dry.  Psychiatric: He has a normal mood and affect. His behavior is normal. Judgment and thought content normal.  Vitals reviewed. He tells me, and his family confirmed that his blood pressure at home is usually much better than it is here.  She is in the 130s to 140s which is similar to what he had at Dr. Pennie Banter office.   Adult ECG Report  Rate: 63;  Rhythm: normal sinus rhythm and Nonspecific ST-T wave changes with T wave inversions in inferior and lateral leads.;  Normal axis, intervals and durations  Narrative Interpretation: Borderline EKG   Other studies Reviewed: Additional studies/ records that were reviewed today include:  Recent Labs: May 2019  TC 167, TG 178, HDL 39, LDL 92.  Hgb 9.1.  Cr 1.66.  K+ 3.8.  TSH 0.484.   ASSESSMENT / PLAN: Problem List Items Addressed This Visit    AAA (abdominal aortic aneurysm) without rupture ([place]) - Primary   Relevant Orders   EKG 12-Lead (Completed)   Cardiomegaly    Other Visit Diagnoses    Coronary artery calcification seen on CT scan       Relevant Orders   EKG 12-Lead (Completed)     Mr. Seiden is a 85 year old gentleman with obviously multiple cardiac risk factors including dyslipidemia, hypertension, PAD with AAA and CKD who is here for some abnormal findings found in the setting of a fall with rib fracture.  He has not had any cardiac symptoms to speak of either before or after his fall with rib fracture episode.  He is not showing any signs of heart  failure or anything to suggest presence of pericardial effusion that would be concerning and treatable.  He is not having any angina despite having coronary calcification. His CT scan suggested left main and LAD/multivessel disease.  I had a long frank discussion with  the patient, his wife and daughter and they are all in agreement that in the absence of symptoms, they would prefer not to proceed with further evaluation.  We talked approximately do an echocardiogram, but if normal that has gratifying, but if abnormal it would still potentially open up a can of worms for doing new studies.  He does not want to do a stress test or coronary CTA to evaluate his coronary arteries, because he probably would not be interested in a cardiac catheterization unless he was actively having angina pain.  When I talked to him about procedures that would potentially be involved such as cardiac catheterization and even the consideration of PCI and/or CABG, he was frank and said "I am 85 years old, and I do not think I should have all these tests ".  In light of this discussion which was involving not only the patient who is very hard of hearing but also his wife and daughter, the all 3 of them without prompting indicated that they probably would not want further evaluation in the absence of symptoms.  They fear finding out bad news as opposed to the fear of the unknown.  If multivessel disease was found, Ronne would not want CABG or high risk PCI. Pericardial effusion was found on echocardiogram, he would not want pericardiocentesis.  As such, I felt that the best course of action will be just to monitor him for signs and symptoms of angina and/or heart failure.  If so, would consider echocardiogram and possibly even coronary CTA.  However based on my impression of his desires, he probably would not want them then at that time either.  With no plans for further evaluation including echocardiogram or coronary CTA with  possible cardiac catheterization, I would recommend that he simply follows up with his PCP for risk factor modification.  Being on aspirin is not unreasonable we discussed that. His lipids are relatively well controlled without medications, and he would prefer not to be on medicine. His blood pressure is high and it would not be unreasonable to consider treatment however he has had some dizziness on occasion has had falls.  Probably some mild permissive hypertension is recommended as well.  He was happy for the time with our conversation.  He understands the decisions he is making, and I am willing to go back to potential plans of echocardiogram and/or coronary CTA if he would so desire based on symptoms.  As such, he will follow-up with [place] on an as-needed basis.  I spent a total of 45 minutes with the patient and chart review. >  50% of the time was spent in direct patient consultation.   Current medicines are reviewed at length with the patient today.  (+/- concerns) none The following changes have been made:  None  Patient Instructions  Medication Instructions:  Not needed If you need a refill on your cardiac medications before your next appointment, please call your pharmacy.   Lab work: Not needed If you have labs (blood work) drawn today and your tests are completely normal, you will receive your results only by: Marland Kitchen MyChart Message (if you have MyChart) OR . A paper copy in the mail If you have any lab test that is abnormal or we need to change your treatment, we will call you to review the results.  Testing/Procedures: Not needed  Follow-Up: At [facility], you and your health needs are our priority.  As part of our continuing mission to provide you  with exceptional heart care, we have created designated Provider Care Teams.  These Care Teams include your primary Cardiologist (physician) and Advanced Practice Providers (APPs -  Physician Assistants and Nurse Practitioners) who all  work together to provide you with the care you need, when you need it. . Your physician recommends that you schedule a follow-up appointment on an as needed basis .   Any Other Special Instructions Will Be Listed Below (If Applicable).     Studies Ordered:   Orders Placed This Encounter  Procedures  . EKG 12-Lead      Glenetta Hew, M.D., M.S. Interventional Cardiologist   Pager # [phone] Phone # [phone] [address] [place], [place] [address]   Thank you for choosing Heartcare at [facility]!!

## 2018-07-06 ENCOUNTER — Encounter: Payer: Self-pay | Admitting: Cardiology

## 2018-07-06 DIAGNOSIS — I517 Cardiomegaly: Secondary | ICD-10-CM | POA: Insufficient documentation

## 2018-07-06 DIAGNOSIS — I2510 Atherosclerotic heart disease of native coronary artery without angina pectoris: Secondary | ICD-10-CM | POA: Insufficient documentation

## 2018-09-26 ENCOUNTER — Other Ambulatory Visit: Payer: Self-pay

## 2018-09-26 ENCOUNTER — Emergency Department (HOSPITAL_COMMUNITY): Payer: Medicare Other

## 2018-09-26 ENCOUNTER — Emergency Department (HOSPITAL_COMMUNITY)
Admission: EM | Admit: 2018-09-26 | Discharge: 2018-09-26 | Disposition: A | Discharge status: Home / Self Care | Payer: Medicare Other | Attending: Emergency Medicine | Admitting: Emergency Medicine

## 2018-09-26 DIAGNOSIS — Y92007 Garden or yard of unspecified non-institutional (private) residence as the place of occurrence of the external cause: Secondary | ICD-10-CM | POA: Insufficient documentation

## 2018-09-26 DIAGNOSIS — Z87891 Personal history of nicotine dependence: Secondary | ICD-10-CM | POA: Insufficient documentation

## 2018-09-26 DIAGNOSIS — Z79899 Other long term (current) drug therapy: Secondary | ICD-10-CM | POA: Diagnosis not present

## 2018-09-26 DIAGNOSIS — N183 Chronic kidney disease, stage 3 (moderate): Secondary | ICD-10-CM | POA: Insufficient documentation

## 2018-09-26 DIAGNOSIS — W19XXXA Unspecified fall, initial encounter: Secondary | ICD-10-CM

## 2018-09-26 DIAGNOSIS — Y939 Activity, unspecified: Secondary | ICD-10-CM | POA: Diagnosis not present

## 2018-09-26 DIAGNOSIS — I129 Hypertensive chronic kidney disease with stage 1 through stage 4 chronic kidney disease, or unspecified chronic kidney disease: Secondary | ICD-10-CM | POA: Diagnosis not present

## 2018-09-26 DIAGNOSIS — Y999 Unspecified external cause status: Secondary | ICD-10-CM | POA: Diagnosis not present

## 2018-09-26 DIAGNOSIS — T148XXA Other injury of unspecified body region, initial encounter: Secondary | ICD-10-CM

## 2018-09-26 DIAGNOSIS — S51802A Unspecified open wound of left forearm, initial encounter: Secondary | ICD-10-CM | POA: Insufficient documentation

## 2018-09-26 DIAGNOSIS — F0390 Unspecified dementia without behavioral disturbance: Secondary | ICD-10-CM | POA: Diagnosis not present

## 2018-09-26 DIAGNOSIS — S51001A Unspecified open wound of right elbow, initial encounter: Secondary | ICD-10-CM | POA: Insufficient documentation

## 2018-09-26 DIAGNOSIS — S0990XA Unspecified injury of head, initial encounter: Secondary | ICD-10-CM | POA: Diagnosis not present

## 2018-09-26 DIAGNOSIS — Z9181 History of falling: Secondary | ICD-10-CM | POA: Insufficient documentation

## 2018-09-26 LAB — URINALYSIS, ROUTINE W REFLEX MICROSCOPIC
Bilirubin Urine: NEGATIVE
Glucose, UA: NEGATIVE mg/dL
Ketones, ur: NEGATIVE mg/dL
Leukocytes,Ua: NEGATIVE
Nitrite: NEGATIVE
Protein, ur: NEGATIVE mg/dL
Specific Gravity, Urine: 1.013 (ref 1.005–1.030)
pH: 5 (ref 5.0–8.0)

## 2018-09-26 LAB — BASIC METABOLIC PANEL
Anion gap: 8 (ref 5–15)
BUN: 15 mg/dL (ref 8–23)
CO2: 27 mmol/L (ref 22–32)
Calcium: 8.8 mg/dL — ABNORMAL LOW (ref 8.9–10.3)
Chloride: 108 mmol/L (ref 98–111)
Creatinine, Ser: 1.85 mg/dL — ABNORMAL HIGH (ref 0.61–1.24)
GFR calc Af Amer: 36 mL/min — ABNORMAL LOW (ref >60)
GFR calc non Af Amer: 31 mL/min — ABNORMAL LOW (ref >60)
Glucose, Bld: 122 mg/dL — ABNORMAL HIGH (ref 70–99)
Potassium: 3.2 mmol/L — ABNORMAL LOW (ref 3.5–5.1)
Sodium: 143 mmol/L (ref 135–145)

## 2018-09-26 LAB — CBC WITH DIFFERENTIAL/PLATELET
Abs Immature Granulocytes: 0.14 10*3/uL — ABNORMAL HIGH (ref 0.00–0.07)
Basophils Absolute: 0 10*3/uL (ref 0.0–0.1)
Basophils Relative: 0 %
Eosinophils Absolute: 0 10*3/uL (ref 0.0–0.5)
Eosinophils Relative: 0 %
HCT: 30.8 % — ABNORMAL LOW (ref 39.0–52.0)
Hemoglobin: 9.5 g/dL — ABNORMAL LOW (ref 13.0–17.0)
Immature Granulocytes: 1 %
Lymphocytes Relative: 7 %
Lymphs Abs: 0.8 10*3/uL (ref 0.7–4.0)
MCH: 28.5 pg (ref 26.0–34.0)
MCHC: 30.8 g/dL (ref 30.0–36.0)
MCV: 92.5 fL (ref 80.0–100.0)
Monocytes Absolute: 0.5 10*3/uL (ref 0.1–1.0)
Monocytes Relative: 4 %
Neutro Abs: 9.9 10*3/uL — ABNORMAL HIGH (ref 1.7–7.7)
Neutrophils Relative %: 88 %
Platelets: 219 10*3/uL (ref 150–400)
RBC: 3.33 MIL/uL — ABNORMAL LOW (ref 4.22–5.81)
RDW: 13.9 % (ref 11.5–15.5)
WBC: 11.4 10*3/uL — ABNORMAL HIGH (ref 4.0–10.5)
nRBC: 0 % (ref 0.0–0.2)

## 2018-09-26 NOTE — ED Provider Notes (Signed)
85 yo M with a history of decreased mental status and frequent falls comes in with a fall.  I received the patient in signout from Dr. Leonette Monarch, plan is to obtain laboratory evaluation CT of the head and C-spine and family was requesting to discharge him home.  They so far have refused home health and are looking to move closer to family for support.  Lab work is returned no urinary tract infection very mild leukocytosis anemia appears to be at baseline CT of the head without acute intracranial abnormality.  CT of the C-spine also negative for acute fracture. D/c home.    Deno Etienne, DO 09/26/18 [phone]

## 2018-09-26 NOTE — ED Notes (Addendum)
This RN called patient's wife Pamala Hurry) to give update. Patient is increasingly confused this shift. Mrs. Hyser is encouraged to hear that she is allowed to come and be by her husband's bedside-she will be provided a mask on arrival.  She states she is on her way

## 2018-09-26 NOTE — ED Notes (Signed)
Patient is very agitated, he is trying to get up from bed, he does not want to any on the monitors or his gown on. Wife states she is on the way

## 2018-09-26 NOTE — ED Notes (Signed)
Patient transported to CT 

## 2018-09-26 NOTE — ED Triage Notes (Signed)
Pt brought to ED by [facility] from home after having a fall. Per family last time they saw the pt was a 1 am where they took him to bed. Around 4:52 family found him out side the house on the driveway where he had unwitnessed fall. Per family pt is having early sign of dementia with increase confusion lately. VS HR 76, BP 179/87, R 18, SPO 2 96 % on RA, SR on monitor, CBG 103. Pt having multiple skin tears, hematoma on the back of his head. Pt is AO to self no neuro deficit noticed on triage. C collar applied by EMS pta to ED.

## 2018-09-26 NOTE — ED Notes (Signed)
Dressings applied as ordered to pts right elbow and and right hand

## 2018-09-26 NOTE — ED Provider Notes (Signed)
[facility] EMERGENCY DEPARTMENT Provider Note  CSN: [id number] Arrival date & time: 09/26/18 0542  Chief Complaint(s) Fall  HPI Bryan Ford is a 85 y.o. male with a past medical history listed below including AAA without rupture, hypertension, ulcerative colitis, and reported dementia by wife presents to the emergency department for unwitnessed fall.  She reports that the patient has intermittent bouts of confusion that have been worsening over the past several months.  He is had frequent falls resulting in broken ankles and ribs.  States that he was in his usual state of health earlier today.  Patient was last seen in bed around 2:30 AM.  Around 4:30 AM the wife heard something and went downstairs to check on her husband and found him outside in the front yard.  EMS was called who brought the patient in for evaluation.  Patient was noted to have multiple skin tears to the scalp and upper extremities.  Remainder of history, ROS, and physical exam limited due to patient's condition (dementia). Additional information was obtained from wife.   Level V Caveat.    HPI  Past Medical History Past Medical History:  Diagnosis Date  . AAA (abdominal aortic aneurysm) ([place])   . AAA (abdominal aortic aneurysm) without rupture (H[facility])    Abdominal ultrasound showed an increase from 4.1 to 5.3 cm diameter AAA.  Marland Kitchen Borderline hypertension   . Chronic kidney disease, stage 3 ([place])   . ED (erectile dysfunction)   . Esophageal reflux   . History of kidney stones   . Hyperlipidemia   . Pneumonia 08-2013  . Ulcerative colitis ([place])    Takes mesalamine   Patient Active Problem List   Diagnosis Date Noted  . Cardiomegaly 07/06/2018  . Coronary artery calcification seen on CT scan 07/06/2018  . Closed fracture of multiple ribs 06/16/2018  . Physical debility 06/16/2018  . Multiple rib fractures 06/16/2018  . Closed fracture of left distal fibula 07/06/2017  . Chronic kidney  disease, stage 3 ([place])   . Fall 07/02/2017  . Acute metabolic encephalopathy 78/24/2353  . Ulcerative colitis ([place]) 07/02/2017  . CKD (chronic kidney disease), stage III ([place]) 07/02/2017  . GERD (gastroesophageal reflux disease) 07/02/2017  . Anemia of chronic disease 07/02/2017  . AAA (abdominal aortic aneurysm) without rupture ([place]) 03/13/2013  . Lung mass 03/13/2013  . Back pain 12/23/2012  . Abdominal aneurysm without mention of rupture 11/21/2012  . AAA (abdominal aortic aneurysm) ([place]) 11/07/2012  . Hypertension 11/07/2012  . PAD (peripheral artery disease) ([place]) 11/07/2012  . Dyslipidemia, goal LDL below 70 11/07/2012   Home Medication(s) Prior to Admission medications   Medication Sig Start Date End Date Taking? Authorizing Provider  acetaminophen (TYLENOL) 325 MG tablet Take 650 mg by mouth every 6 (six) hours as needed for mild pain.    [provider]  Ascorbic Acid (VITAMIN C) 500 MG CHEW Chew 1 tablet by mouth daily.    [provider]  Cholecalciferol (VITAMIN D3) 1000 units CHEW Chew 1 tablet by mouth daily.    [provider]  dextromethorphan-guaiFENesin (MUCINEX DM) 30-600 MG 12hr tablet Take 1 tablet by mouth 2 (two) times daily as needed for cough.    [provider]  ferrous sulfate 325 (65 FE) MG tablet Take 1 tablet (325 mg total) by mouth daily. 09/09/17   Gareth Morgan, MD  mesalamine (LIALDA) 1.2 G EC tablet Take 2.4 g by mouth daily with breakfast.     [provider]  traMADol (ULTRAM) 50 MG tablet Take 1 tablet (50 mg total) by mouth every 12 (twelve) hours as needed. 06/17/18 06/17/19  Thurnell Lose, MD                                                                                                                                    Past Surgical History Past Surgical History:  Procedure Laterality Date  . ABDOMINAL AORTIC ENDOVASCULAR STENT GRAFT N/A 12/16/2012   Procedure: ABDOMINAL AORTIC ENDOVASCULAR STENT  GRAFT-GORE;  Surgeon: Serafina Mitchell, MD;  Location: [place];  Service: Vascular;  Laterality: N/A;  Ultrasound guided  . BACK SURGERY    . ESOPHAGOGASTRODUODENOSCOPY N/A 12/26/2013   Procedure: ESOPHAGOGASTRODUODENOSCOPY (EGD);  Surgeon: Winfield Cunas., MD;  Location: [facility];  Service: Endoscopy;  Laterality: N/A;  . FRACTURE SURGERY    . KIDNEY STONE SURGERY  1970s   "never opened him up"  . MANDIBLE FRACTURE SURGERY  college  . MAXIMUM ACCESS (MAS)POSTERIOR LUMBAR INTERBODY FUSION (PLIF) 1 LEVEL  03/2000   L5-S1 laminectomy, diskectomy, posterior lumbar body fusion with decompressive lumbar laminectomy at L5-S1/notes 09/24/2010  . SAVORY DILATION N/A 12/26/2013   Procedure: SAVORY DILATION;  Surgeon: Winfield Cunas., MD;  Location: [facility];  Service: Endoscopy;  Laterality: N/A;  . TONSILLECTOMY     Family History Family History  Problem Relation Age of Onset  . Cancer Mother     Social History Social History   Tobacco Use  . Smoking status: Former Smoker    Years: 20.00    Types: Cigarettes, Cigars    Last attempt to quit: 11/01/1973    Years since quitting: 44.9  . Smokeless tobacco: Never Used  Substance Use Topics  . Alcohol use: Yes    Comment: 07/02/2017 occasionally, but not every week  . Drug use: No   Allergies Patient has no known allergies.  Review of Systems Review of Systems  Unable to perform ROS: Dementia    Physical Exam Vital Signs  I have reviewed the triage vital signs BP (!) 192/82 (BP Location: Right Arm)   Pulse 79   Temp 98.3 F (36.8 C) (Oral)   Resp 14   SpO2 100%   Physical Exam Constitutional:      General: He is not in acute distress.    Appearance: He is well-developed. He is not diaphoretic.     Interventions: Cervical collar in place.  HENT:     Head: Normocephalic. No raccoon eyes or Battle's sign.      Right Ear: External ear normal.     Left Ear: External ear normal.  Eyes:     General: No scleral  icterus.       Right eye: No discharge.        Left eye: No discharge.     Conjunctiva/sclera: Conjunctivae normal.     Pupils: Pupils are equal, round, and reactive to light.  Neck:     Musculoskeletal: Normal range of motion and neck supple.  Cardiovascular:     Rate and Rhythm: Regular rhythm.     Pulses:          Radial pulses are 2+ on the right side and 2+ on the left side.       Dorsalis pedis pulses are 2+ on the right side and 2+ on the left side.     Heart sounds: Normal heart sounds. No murmur. No friction rub. No gallop.   Pulmonary:     Effort: Pulmonary effort is normal. No respiratory distress.     Breath sounds: Normal breath sounds. No stridor.  Abdominal:     General: There is no distension.     Palpations: Abdomen is soft.     Tenderness: There is no abdominal tenderness.  Musculoskeletal:     Cervical back: He exhibits no bony tenderness.     Thoracic back: He exhibits no bony tenderness.     Lumbar back: He exhibits no bony tenderness.     Comments: Clavicle stable. Chest stable to AP/Lat compression. Pelvis stable to Lat compression. No obvious extremity deformity. No chest or abdominal wall contusion.  Skin:    General: Skin is warm.       Neurological:     Mental Status: He is alert. He is disoriented.     GCS: GCS eye subscore is 4. GCS verbal subscore is 5. GCS motor subscore is 6.     Comments: Oriented to self only. moving all extremities      ED Results and Treatments Labs (all labs ordered are listed, but only abnormal results are displayed) Labs Reviewed  CBC WITH DIFFERENTIAL/PLATELET  BASIC METABOLIC PANEL  URINALYSIS, ROUTINE W REFLEX MICROSCOPIC                                                                                                                         EKG  EKG Interpretation  Date/Time:  Monday Sep 26 2018 07:03:42 EDT Ventricular Rate:  79 PR Interval:    QRS Duration: 94 QT Interval:  369 QTC Calculation: 423 R  Axis:   6 Text Interpretation:  Normal sinus rhythm Anteroseptal infarct, old Borderline repolarization abnormality Artifact Otherwise no significant change Confirmed by Addison Lank [phone]) on 09/26/2018 7:09:02 AM      Radiology No results found. Pertinent labs & imaging results that were available during my care of the patient were reviewed by me and considered in my medical decision making (see chart for details).  Medications Ordered in ED Medications - No data to display  Procedures Procedures  (including critical care time)  Medical Decision Making / ED Course I have reviewed the nursing notes for this encounter and the patient's prior records (if available in EHR or on provided paperwork).    Unwitnessed fall at home resulting in head trauma.  Patient is baseline mental status.  Noted to have superficial skin avulsion to the occipital scalp with upper extremity skin avulsions.  No other evidence of trauma noted on exam.  We will obtain screening labs and urine to assess for any electrolyte derangements or possible infection.  EKG for any acute ischemia or dysrhythmias.  CT of the head and cervical spine to assess for any injuries.  Wounds will be thoroughly irrigated and bandaged.  No need for suturing or staples at this time.  EKG without acute ischemic changes or dysrhythmias.  CT head without ICH.  Noted to have evidence of normal pressure hydrocephalus.  Patient care turned over to Dr Tyrone Nine at 0700. Patient case and results discussed in detail; please see their note for further ED managment.    Final Clinical Impression(s) / ED Diagnoses Final diagnoses:  Fall, initial encounter  Injury of head, initial encounter  Multiple skin tears      This chart was dictated using voice recognition software.  Despite best efforts to proofread,  errors  can occur which can change the documentation meaning.   Fatima Blank, MD 09/26/18 [phone]

## 2018-09-26 NOTE — ED Notes (Addendum)
Pts wife is here in his room.

## 2018-09-26 NOTE — ED Notes (Addendum)
Pt talked to his Wife on his room phone.

## 2018-09-26 NOTE — Discharge Instructions (Signed)
Return if you feel it is unsafe for him to be at home.

## 2019-05-12 DEATH — deceased
